# Patient Record
Sex: Male | Born: 1935 | Race: White | Hispanic: No | Marital: Married | Smoking: Never smoker
Health system: Southern US, Community
[De-identification: ages and names within clinical notes are randomized; demographics above are authoritative.]

## PROBLEM LIST (undated history)

## (undated) DIAGNOSIS — I10 Essential (primary) hypertension: Secondary | ICD-10-CM

## (undated) DIAGNOSIS — L03119 Cellulitis of unspecified part of limb: Secondary | ICD-10-CM

## (undated) DIAGNOSIS — I8000 Phlebitis and thrombophlebitis of superficial vessels of unspecified lower extremity: Secondary | ICD-10-CM

## (undated) DIAGNOSIS — M1710 Unilateral primary osteoarthritis, unspecified knee: Secondary | ICD-10-CM

## (undated) DIAGNOSIS — E119 Type 2 diabetes mellitus without complications: Secondary | ICD-10-CM

## (undated) DIAGNOSIS — E039 Hypothyroidism, unspecified: Secondary | ICD-10-CM

## (undated) DIAGNOSIS — L02419 Cutaneous abscess of limb, unspecified: Secondary | ICD-10-CM

## (undated) DIAGNOSIS — E785 Hyperlipidemia, unspecified: Secondary | ICD-10-CM

## (undated) DIAGNOSIS — L259 Unspecified contact dermatitis, unspecified cause: Secondary | ICD-10-CM

## (undated) HISTORY — DX: Hyperlipidemia, unspecified: E78.5

## (undated) HISTORY — DX: Unilateral primary osteoarthritis, unspecified knee: M17.10

## (undated) HISTORY — DX: Essential (primary) hypertension: I10

## (undated) HISTORY — DX: Unspecified contact dermatitis, unspecified cause: L25.9

## (undated) HISTORY — PX: KNEE SURGERY: SHX244

## (undated) HISTORY — DX: Cellulitis of unspecified part of limb: L03.119

## (undated) HISTORY — DX: Cutaneous abscess of limb, unspecified: L02.419

## (undated) HISTORY — PX: CHOLECYSTECTOMY: SHX55

## (undated) HISTORY — DX: Type 2 diabetes mellitus without complications: E11.9

## (undated) HISTORY — PX: OTHER SURGICAL HISTORY: SHX169

## (undated) HISTORY — DX: Hypothyroidism, unspecified: E03.9

## (undated) HISTORY — DX: Phlebitis and thrombophlebitis of superficial vessels of unspecified lower extremity: I80.00

---
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
## 2001-04-07 ENCOUNTER — Ambulatory Visit (HOSPITAL_COMMUNITY): Admission: RE | Admit: 2001-04-07 | Discharge: 2001-04-07 | Payer: Self-pay | Admitting: Family Medicine

## 2003-02-24 HISTORY — PX: CATARACT EXTRACTION: SUR2

## 2004-03-31 ENCOUNTER — Ambulatory Visit: Payer: Self-pay | Admitting: Gastroenterology

## 2004-04-15 ENCOUNTER — Ambulatory Visit: Payer: Self-pay | Admitting: Gastroenterology

## 2004-04-15 HISTORY — PX: COLONOSCOPY: SHX174

## 2005-04-14 ENCOUNTER — Inpatient Hospital Stay (HOSPITAL_COMMUNITY): Admission: RE | Admit: 2005-04-14 | Discharge: 2005-04-17 | Payer: Self-pay | Admitting: Orthopedic Surgery

## 2005-05-11 ENCOUNTER — Encounter: Admission: RE | Admit: 2005-05-11 | Discharge: 2005-06-09 | Payer: Self-pay | Admitting: Orthopedic Surgery

## 2005-06-10 ENCOUNTER — Encounter: Admission: RE | Admit: 2005-06-10 | Discharge: 2005-06-17 | Payer: Self-pay | Admitting: Orthopedic Surgery

## 2007-11-07 ENCOUNTER — Encounter: Payer: Self-pay | Admitting: Family Medicine

## 2008-05-08 ENCOUNTER — Ambulatory Visit: Payer: Self-pay | Admitting: Family Medicine

## 2008-05-08 DIAGNOSIS — L259 Unspecified contact dermatitis, unspecified cause: Secondary | ICD-10-CM

## 2008-05-08 DIAGNOSIS — E119 Type 2 diabetes mellitus without complications: Secondary | ICD-10-CM

## 2008-05-08 DIAGNOSIS — E039 Hypothyroidism, unspecified: Secondary | ICD-10-CM

## 2008-05-08 DIAGNOSIS — E785 Hyperlipidemia, unspecified: Secondary | ICD-10-CM

## 2008-05-08 DIAGNOSIS — I10 Essential (primary) hypertension: Secondary | ICD-10-CM

## 2008-05-08 HISTORY — DX: Unspecified contact dermatitis, unspecified cause: L25.9

## 2008-05-08 HISTORY — DX: Type 2 diabetes mellitus without complications: E11.9

## 2008-05-08 HISTORY — DX: Hypothyroidism, unspecified: E03.9

## 2008-05-08 HISTORY — DX: Hyperlipidemia, unspecified: E78.5

## 2008-05-08 HISTORY — DX: Essential (primary) hypertension: I10

## 2008-05-09 ENCOUNTER — Telehealth: Payer: Self-pay | Admitting: Family Medicine

## 2008-07-09 ENCOUNTER — Ambulatory Visit: Payer: Self-pay | Admitting: Family Medicine

## 2008-08-07 ENCOUNTER — Ambulatory Visit: Payer: Self-pay | Admitting: Family Medicine

## 2008-08-09 ENCOUNTER — Ambulatory Visit: Payer: Self-pay | Admitting: Family Medicine

## 2008-08-09 LAB — CONVERTED CEMR LAB
ALT: 23 units/L (ref 0–53)
Albumin: 4.1 g/dL (ref 3.5–5.2)
Alkaline Phosphatase: 40 units/L (ref 39–117)
BUN: 15 mg/dL (ref 6–23)
Bilirubin, Direct: 0.3 mg/dL (ref 0.0–0.3)
Calcium: 10.1 mg/dL (ref 8.4–10.5)
Cholesterol: 114 mg/dL (ref 0–200)
Creatinine, Ser: 1.1 mg/dL (ref 0.4–1.5)
GFR calc non Af Amer: 70.11 mL/min (ref >60)
TSH: 2.84 microintl units/mL (ref 0.35–5.50)
Total Protein: 7.5 g/dL (ref 6.0–8.3)
Triglycerides: 118 mg/dL (ref 0.0–149.0)
VLDL: 23.6 mg/dL (ref 0.0–40.0)

## 2008-08-10 LAB — CONVERTED CEMR LAB: Hgb A1c MFr Bld: 6.9 % — ABNORMAL HIGH (ref 4.6–6.5)

## 2008-11-27 ENCOUNTER — Telehealth: Payer: Self-pay | Admitting: Family Medicine

## 2008-12-27 ENCOUNTER — Ambulatory Visit: Payer: Self-pay | Admitting: Family Medicine

## 2008-12-27 DIAGNOSIS — I8000 Phlebitis and thrombophlebitis of superficial vessels of unspecified lower extremity: Secondary | ICD-10-CM

## 2008-12-27 HISTORY — DX: Phlebitis and thrombophlebitis of superficial vessels of unspecified lower extremity: I80.00

## 2009-02-06 ENCOUNTER — Ambulatory Visit: Payer: Self-pay | Admitting: Family Medicine

## 2009-02-06 LAB — CONVERTED CEMR LAB: Cholesterol, target level: 200 mg/dL

## 2009-03-25 ENCOUNTER — Ambulatory Visit: Payer: Self-pay | Admitting: Orthopedic Surgery

## 2009-03-25 DIAGNOSIS — IMO0002 Reserved for concepts with insufficient information to code with codable children: Secondary | ICD-10-CM

## 2009-03-25 DIAGNOSIS — M1710 Unilateral primary osteoarthritis, unspecified knee: Secondary | ICD-10-CM | POA: Insufficient documentation

## 2009-03-25 HISTORY — DX: Reserved for concepts with insufficient information to code with codable children: IMO0002

## 2009-03-27 ENCOUNTER — Encounter (INDEPENDENT_AMBULATORY_CARE_PROVIDER_SITE_OTHER): Payer: Self-pay | Admitting: *Deleted

## 2009-03-28 ENCOUNTER — Encounter (INDEPENDENT_AMBULATORY_CARE_PROVIDER_SITE_OTHER): Payer: Self-pay | Admitting: *Deleted

## 2009-04-16 ENCOUNTER — Inpatient Hospital Stay (HOSPITAL_COMMUNITY): Admission: RE | Admit: 2009-04-16 | Discharge: 2009-04-19 | Payer: Self-pay | Admitting: Orthopedic Surgery

## 2009-04-16 ENCOUNTER — Ambulatory Visit: Payer: Self-pay | Admitting: Orthopedic Surgery

## 2009-04-17 ENCOUNTER — Encounter: Payer: Self-pay | Admitting: Orthopedic Surgery

## 2009-04-19 ENCOUNTER — Telehealth: Payer: Self-pay | Admitting: Orthopedic Surgery

## 2009-04-19 ENCOUNTER — Encounter: Payer: Self-pay | Admitting: Orthopedic Surgery

## 2009-04-22 ENCOUNTER — Ambulatory Visit: Payer: Self-pay | Admitting: Orthopedic Surgery

## 2009-04-22 ENCOUNTER — Telehealth: Payer: Self-pay | Admitting: Orthopedic Surgery

## 2009-04-22 DIAGNOSIS — L02419 Cutaneous abscess of limb, unspecified: Secondary | ICD-10-CM

## 2009-04-22 DIAGNOSIS — L03119 Cellulitis of unspecified part of limb: Secondary | ICD-10-CM

## 2009-04-22 HISTORY — DX: Cutaneous abscess of limb, unspecified: L02.419

## 2009-04-24 ENCOUNTER — Telehealth (INDEPENDENT_AMBULATORY_CARE_PROVIDER_SITE_OTHER): Payer: Self-pay | Admitting: *Deleted

## 2009-04-24 ENCOUNTER — Encounter: Payer: Self-pay | Admitting: Orthopedic Surgery

## 2009-04-30 ENCOUNTER — Ambulatory Visit: Payer: Self-pay | Admitting: Orthopedic Surgery

## 2009-05-06 ENCOUNTER — Telehealth: Payer: Self-pay | Admitting: Orthopedic Surgery

## 2009-05-10 ENCOUNTER — Encounter: Payer: Self-pay | Admitting: Orthopedic Surgery

## 2009-05-10 ENCOUNTER — Telehealth: Payer: Self-pay | Admitting: Orthopedic Surgery

## 2009-05-14 ENCOUNTER — Encounter: Admission: RE | Admit: 2009-05-14 | Discharge: 2009-08-12 | Payer: Self-pay | Admitting: Orthopedic Surgery

## 2009-05-14 ENCOUNTER — Encounter: Payer: Self-pay | Admitting: Orthopedic Surgery

## 2009-05-28 ENCOUNTER — Ambulatory Visit: Payer: Self-pay | Admitting: Orthopedic Surgery

## 2009-07-01 ENCOUNTER — Encounter: Payer: Self-pay | Admitting: Orthopedic Surgery

## 2009-07-10 ENCOUNTER — Ambulatory Visit: Payer: Self-pay | Admitting: Orthopedic Surgery

## 2009-07-11 ENCOUNTER — Ambulatory Visit: Payer: Self-pay | Admitting: Family Medicine

## 2009-07-12 LAB — CONVERTED CEMR LAB
AST: 25 units/L (ref 0–37)
Albumin: 4.3 g/dL (ref 3.5–5.2)
Chloride: 97 meq/L (ref 96–112)
Cholesterol: 123 mg/dL (ref 0–200)
Glucose, Bld: 133 mg/dL — ABNORMAL HIGH (ref 70–99)
HDL: 32.1 mg/dL — ABNORMAL LOW (ref >39.00)
Hgb A1c MFr Bld: 7 % — ABNORMAL HIGH (ref 4.6–6.5)
LDL Cholesterol: 68 mg/dL (ref 0–99)
Potassium: 4.4 meq/L (ref 3.5–5.1)
Sodium: 135 meq/L (ref 135–145)
Total CHOL/HDL Ratio: 4
Triglycerides: 114 mg/dL (ref 0.0–149.0)
VLDL: 22.8 mg/dL (ref 0.0–40.0)

## 2009-08-12 ENCOUNTER — Encounter: Payer: Self-pay | Admitting: Family Medicine

## 2009-10-10 ENCOUNTER — Ambulatory Visit: Payer: Self-pay | Admitting: Orthopedic Surgery

## 2009-11-02 LAB — HM DIABETES EYE EXAM

## 2009-11-14 ENCOUNTER — Encounter: Payer: Self-pay | Admitting: Family Medicine

## 2010-01-06 ENCOUNTER — Ambulatory Visit: Payer: Self-pay | Admitting: Family Medicine

## 2010-01-07 LAB — CONVERTED CEMR LAB: Hgb A1c MFr Bld: 7.1 % — ABNORMAL HIGH (ref 4.6–6.5)

## 2010-03-25 NOTE — Miscellaneous (Signed)
Summary: [facility]ed info to [facility] for tka  Clinical Lists Changes

## 2010-03-25 NOTE — Miscellaneous (Signed)
Summary: PT progress note  PT progress note   Imported By: Jacklynn Ganong 07/19/2009 08:29:21  _____________________________________________________________________  External Attachment:    Type:   Image     Comment:   External Document

## 2010-03-25 NOTE — Assessment & Plan Note (Signed)
Summary: POST OP 1/RT TKA/SURG 04/16/09/HUMANA/CAF   Visit Type:  Follow-up Primary Provider:  Evelena Peat MD  CC:  post op tka.  History of Present Illness: post op RT TKA 04/16/2009  POD # 14   cellulitis in the distal part of the tibia is being treated with Keflex. He has 5 more days of that. That has improved.  His incision looks good in that area has improved, but still surrounding erythema distally. Swelling is down.  I removed the staples today.  Continue physical therapy. Followup in 4 weeks for 1st x-ray since surgery  Staples out in our office today.  Pain med helps, Hydrocodone and Robaxin.     Allergies: No Known Drug Allergies   Impression & Recommendations:  Problem # 1:  anAFTERCARE FOLLOW SURGERY MUSCULOSKEL SYSTEM NEC (ICD-V58.78)  Orders: Post-Op Check (21308)  Problem # 2:  KNEE, ARTHRITIS, DEGEN./OSTEO (ICD-715.96)  His updated medication list for this problem includes:    Eql Aspirin Ec 325 Mg Tbec (Aspirin) ..... Once daily  Orders: Post-Op Check (65784)  Patient Instructions: 1)  STOP CPM  2)  RETURN IN 4 WEEKS FOR 1ST POST OP XRAYS  3)  FINISH KEFLEX.

## 2010-03-25 NOTE — Progress Notes (Signed)
Summary: PT and INR  and has red area on leg  Phone Note Other Incoming   Summary of Call: [name] called about [name] (07-28-2035) his INR is 1.2 and PT 12.3 taking 3mg  Coumadin only has 4 left. Also says a small area on the front of his leg is red and warm to the touch,  thinks may be cellulitis.  Told him to come into the office this afternoon for Dr. Romeo Apple to check. His # [phone] Initial call taken by: Jacklynn Ganong,  April 22, 2009 9:51 AM

## 2010-03-25 NOTE — Assessment & Plan Note (Signed)
Summary: POST OP 2/4 WK RE-CK/XRAY TKA RT/SURG 04/16/09/HUMANA/CAF   Visit Type:  Follow-up Primary Provider:  Evelena Peat MD  CC:  postop.  History of Present Illness: PROCEDURE:  Right total knee arthroplasty with a DePuy Sigma rotating platform posterior stabilized knee.  Sizes of implants, size 5 femur, size 5 tibia, size 15 posterior stabilized polyethylene insert and a 38 patella.  date of surgery, April 16, 2009  Treated with Keflex for a lower leg cellulitis  Current medications Oliveri once in a while.  Examination shows he still has a lot of leg edema and swelling. No calf tenderness and a negative Homans sign. His range of motion is 0-90.  We took 3 views of the implant and it shows that the prosthesis is in good position. There is slight varus tilt to the tibial tray. Everything else looks great.  Impression normal postop appearance. Total knee film.  Continue physical therapy to try to gain 110 of flexion. Followup 6 weeks. No x-rays needed  Allergies: No Known Drug Allergies   Impression & Recommendations:  Problem # 1:  CELLULITIS, RIGHT LEG (ICD-682.6) Assessment Improved  Problem # 2:  AFTERCARE FOLLOW SURGERY MUSCULOSKEL SYSTEM NEC (ICD-V58.78)  Orders: Post-Op Check (60454) Knee x-ray,  3 views (09811)  Patient Instructions: 1)  continue rehab and see in 6 weeks

## 2010-03-25 NOTE — Progress Notes (Signed)
Summary: still redness of leg, finished ATBS today  Phone Note Other Incoming Call back at [facility] with [facility]   Summary of Call: staples out last week, had phlebitis of the leg, finished ATBS today still has some redness, warmth no pain. Any suggestions? Initial call taken by: Ether Griffins,  May 06, 2009 10:03 AM  Follow-up for Phone Call        resume antibiotics  Keflex 500mg  bid Follow-up by: Fuller Canada MD,  May 06, 2009 10:04 AM    New/Updated Medications: KEFLEX 500 MG CAPS (CEPHALEXIN) one by mouth bid Prescriptions: KEFLEX 500 MG CAPS (CEPHALEXIN) one by mouth bid  #28 x 1   Entered by:   Ether Griffins   Authorized by:   Fuller Canada MD   Signed by:   Ether Griffins on 05/06/2009   Method used:   Faxed to ...       [facility]  [address] [phone]* (retail)       [address]       [place], [place]  [address]       Ph: [phone] or [phone]       Fax: [phone]   RxID:   [id number]

## 2010-03-25 NOTE — Progress Notes (Signed)
Summary: call from home health Coumadin question  Phone Note Other Incoming   Caller: Home health nurse Summary of Call: [facility] nurse Jacqulyn Cane called and has question re: Coumadin.  Please call cell [phone]. until Friday and then stop coumadin for knees   Initial call taken by: Cammie Sickle,  April 24, 2009 11:55 AM  Follow-up for Phone Call        called and left message that pt will stop Coumadin on Friday, No need for a PT INR check, call me if any other questions. Follow-up by: Ether Griffins,  April 24, 2009 12:35 PM

## 2010-03-25 NOTE — Letter (Signed)
Summary: surgery order RT total knee sched 04/16/09  surgery order RT total knee sched 04/16/09   Imported By: Cammie Sickle 06/05/2009 09:24:03  _____________________________________________________________________  External Attachment:    Type:   Image     Comment:   External Document

## 2010-03-25 NOTE — Miscellaneous (Signed)
Summary: PT order  PT order   Imported By: Cammie Sickle 06/05/2009 09:21:01  _____________________________________________________________________  External Attachment:    Type:   Image     Comment:   External Document

## 2010-03-25 NOTE — Miscellaneous (Signed)
Summary: [facility] verbal orders  [employer] orders   Imported By: Jacklynn Ganong 05/07/2009 13:24:55  _____________________________________________________________________  External Attachment:    Type:   Image     Comment:   External Document

## 2010-03-25 NOTE — Miscellaneous (Signed)
Summary: Home Care Report  Home Care Report   Imported By: Elvera Maria 04/19/2009 10:06:18  _____________________________________________________________________  External Attachment:    Type:   Image     Comment:   [facility] eval

## 2010-03-25 NOTE — Medication Information (Signed)
Summary: Refill Request for Simvastatin  Refill Request for Simvastatin   Imported By: Maryln Gottron 08/13/2009 13:40:38  _____________________________________________________________________  External Attachment:    Type:   Image     Comment:   External Document

## 2010-03-25 NOTE — Miscellaneous (Signed)
Summary: PT Initial summary  PT Initial summary   Imported By: Jacklynn Ganong 05/20/2009 08:11:36  _____________________________________________________________________  External Attachment:    Type:   Image     Comment:   External Document

## 2010-03-25 NOTE — Letter (Signed)
Summary: Diabetic Eye Exam/[facility]  Diabetic Eye [facility]   Imported By: Maryln Gottron 11/21/2009 10:29:19  _____________________________________________________________________  External Attachment:    Type:   Image     Comment:   External Document

## 2010-03-25 NOTE — Letter (Signed)
Summary: Letter of medical necessity  Letter of medical necessity   Imported By: Jacklynn Ganong 05/02/2009 16:31:08  _____________________________________________________________________  External Attachment:    Type:   Image     Comment:   External Document

## 2010-03-25 NOTE — Assessment & Plan Note (Signed)
Summary: 6 WK RE-CK FOL'G PT/RT TKA SURG 04/16/09/[facility]MANA/CAF   Visit Type:  post op Primary Provider:  Evelena Peat MD  CC:  right knee.  History of Present Illness: 75 year old male status post knee replacement surgery, 12 week postop  PROCEDURE:  Right total knee arthroplasty with a DePuy Sigma rotating platform posterior stabilized knee.    date of surgery, April 16, 2009  Current medications Nancie Neas once in a while.  He has no pain most of the time his therapy notes indicate 120 of flexion full extension he met all goals  His knee looks great he will followup with me in 3 months no x-ray needed  Allergies: No Known Drug Allergies   Impression & Recommendations:  Problem # 1:  AFTERCARE FOLLOW SURGERY MUSCULOSKEL SYSTEM NEC (ICD-V58.78) Assessment Comment Only  doing very well no x-rays needed Visit 3 month followup  Orders: Post-Op Check (16109)  Patient Instructions: 1)  Please schedule a follow-up appointment in 3 months.

## 2010-03-25 NOTE — Letter (Signed)
Summary: *Consult Note  [facility]  [address] [place] [address]  [place], [place] [address]   Phone: [phone]  Fax: [phone]    Re:    [name] DOB:    Sep 21, 1935   Dear: Smitty Cords   Thank you for requesting that we see the above patient for consultation.  A copy of the detailed office note will be sent under separate cover, for your review.  Evaluation today is consistent with:  1)  KNEE, ARTHRITIS, DEGEN./OSTEO (ICD-715.96)   Our recommendation is for: RIGHT total knee arthroplasty   New Orders include:  1)  New Patient Level IV [99204] 2)  Knee x-ray,  3 views [73562]   New Medications started today include:    After today's visit, the patients current medications include: 1)  LEVOTHROID 88 MCG TABS (LEVOTHYROXINE SODIUM) once daily 2)  METFORMIN HCL 500 MG TABS (METFORMIN HCL) 2 pills in am, 1 pill in evening 3)  PROPRANOLOL-HCTZ 40-25 MG TABS (PROPRANOLOL-HCTZ) once daily 4)  SIMVASTATIN 40 MG TABS (SIMVASTATIN) once daily 5)  EQL ASPIRIN EC 325 MG TBEC (ASPIRIN) once daily 6)  OSTEO BI-FLEX JOINT SHIELD  TABS (MISC NATURAL PRODUCTS) 2 pills daily 7)  CHELATED POTASSIUM 99 MG TABS (POTASSIUM) once daily 8)  TRIAMCINOLONE ACETONIDE 0.1 % CREA (TRIAMCINOLONE ACETONIDE) apply to affected rash two times a day prn 9)  AMLODIPINE BESYLATE 10 MG TABS (AMLODIPINE BESYLATE) one by mouth once daily 10)  BENAZEPRIL HCL 20 MG TABS (BENAZEPRIL HCL) one by mouth once daily   Thank you for this consultation.  If you have any further questions regarding the care of this patient, please do not hesitate to contact me @ [phone]  Thank you for this opportunity to look after your patient.  Sincerely,   Fuller Canada MD

## 2010-03-25 NOTE — Assessment & Plan Note (Signed)
Summary: cpx---will fast//ccm   Vital Signs:  Patient profile:   75 year old male Height:      71 inches Weight:      208 pounds BMI:     29.11 Temp:     98.7 degrees F oral Pulse rate:   72 / minute Pulse rhythm:   regular Resp:     12 per minute BP sitting:   120 / 84  (left arm) Cuff size:   large  Vitals Entered By: Sid Falcon LPN (Jul 11, 2009 9:06 AM)  Nutrition Counseling: Patient's BMI is greater than 25 and therefore counseled on weight management options. CC: CPX, pt fasting, Hypertension Management, Lipid Management   History of Present Illness: Here for f/u multiple medical problems.  R TKR 2/11 and recovered well.  Diabetes around 130 fasting.  No hypoglycemia.  hypothyroid stable on medication.  Last tetanus unknown. Checking on date of last colonoscopy.  Diabetes Management History:      He has not been enrolled in the "Diabetic Education Program".  He states understanding of dietary principles and is following his diet appropriately.  No sensory loss is reported.  Self foot exams are being performed.  He is checking home blood sugars.        Hypoglycemic symptoms are not occurring.  No hyperglycemic symptoms are reported.        There are no symptoms to suggest diabetic complications.    Hypertension History:      He denies headache, chest pain, palpitations, dyspnea with exertion, orthopnea, PND, neurologic problems, syncope, and side effects from treatment.        Positive major cardiovascular risk factors include male age 56 years old or older, diabetes, hyperlipidemia, hypertension, and family history for ischemic heart disease (males less than 87 years old).  Negative major cardiovascular risk factors include non-tobacco-user status.        Further assessment for target organ damage reveals no history of ASHD, stroke/TIA, or peripheral vascular disease.    Lipid Management History:      Positive NCEP/ATP III risk factors include male age 43 years old  or older, diabetes, HDL cholesterol less than 40, family history for ischemic heart disease (males less than 1 years old), and hypertension.  Negative NCEP/ATP III risk factors include non-tobacco-user status, no ASHD (atherosclerotic heart disease), no prior stroke/TIA, no peripheral vascular disease, and no history of aortic aneurysm.      Allergies: No Known Drug Allergies  Past History:  Past Medical History: Last updated: 03/25/2009 Diabetes mellitus, type II Hypertension Arthritis Chicken Pox Hay Fever/Allergies Hepatitis/Jaundice cholesterol high thyroid  Past Surgical History: Last updated: 03/25/2009 Cholecystectomy1997 Tonsillectomy  1946 left knee replacement/Feb 2007 Dr. Ranell Patrick  nasal polyps  Family History: Last updated: 03/25/2009 Family History of Arthritis parent Family History Hypertension Parent Family History of Cardiovascular disorder  parent Stroke  Family History Coronary Heart Disease male < 37 Family History of Arthritis  Social History: Last updated: 03/25/2009 Retired Married Never Smoked Alcohol use-no 4 cups of caffeine daily  Dr. Caryl Never PMD in [place]/  Risk Factors: Smoking Status: never (05/08/2008) PMH-FH-SH reviewed for relevance  Review of Systems  The patient denies anorexia, weight loss, weight gain, vision loss, decreased hearing, chest pain, syncope, dyspnea on exertion, prolonged cough, headaches, abdominal pain, and incontinence.    Physical Exam  General:  Well-developed,well-nourished,in no acute distress; alert,appropriate and cooperative throughout examination Eyes:  pupils equal, pupils round, and pupils reactive to light.   Ears:  External ear exam shows no significant lesions or deformities.  Otoscopic examination reveals clear canals, tympanic membranes are intact bilaterally without bulging, retraction, inflammation or discharge. Hearing is grossly normal bilaterally. Mouth:  Oral mucosa and oropharynx without  lesions or exudates.  Teeth in good repair. Neck:  No deformities, masses, or tenderness noted. Lungs:  Normal respiratory effort, chest expands symmetrically. Lungs are clear to auscultation, no crackles or wheezes. Heart:  normal rate, regular rhythm, and no gallop.   Extremities:  trace edema bil.  No foot lesions. Neurologic:  alert & oriented X3, cranial nerves II-XII intact, and strength normal in all extremities.    Diabetes Management Exam:    Foot Exam (with socks and/or shoes not present):       Sensory-Pinprick/Light touch:          Left medial foot (L-4): normal          Left dorsal foot (L-5): normal          Left lateral foot (S-1): normal          Right medial foot (L-4): normal          Right dorsal foot (L-5): normal          Right lateral foot (S-1): normal       Sensory-Monofilament:          Left foot: normal          Right foot: normal       Inspection:          Left foot: normal          Right foot: normal       Nails:          Left foot: thickened          Right foot: thickened    Eye Exam:       Eye Exam done elsewhere          Date: 10/24/2008          Results: normal   Impression & Recommendations:  Problem # 1:  HYPERTENSION (ICD-401.9)  His updated medication list for this problem includes:    Propranolol-hctz 40-25 Mg Tabs (Propranolol-hctz) ..... Once daily    Amlodipine Besylate 10 Mg Tabs (Amlodipine besylate) ..... One by mouth once daily    Benazepril Hcl 20 Mg Tabs (Benazepril hcl) ..... One by mouth once daily  Orders: Prescription Created Electronically [phone]) Venipuncture [phone]) TLB-BMP (Basic Metabolic Panel-BMET) (80048-METABOL)  Problem # 2:  DIABETES MELLITUS, TYPE II (ICD-250.00)  His updated medication list for this problem includes:    Metformin Hcl 500 Mg Tabs (Metformin hcl) .Marland Kitchen... 2 pills in am, 1 pill in evening    Eql Aspirin Ec 325 Mg Tbec (Aspirin) ..... Once daily    Benazepril Hcl 20 Mg Tabs (Benazepril hcl)  ..... One by mouth once daily  Orders: Venipuncture (14782) TLB-A1C / Hgb A1C (Glycohemoglobin) (83036-A1C)  Problem # 3:  HYPERLIPIDEMIA (ICD-272.4)  His updated medication list for this problem includes:    Simvastatin 40 Mg Tabs (Simvastatin) ..... Once daily  Orders: TLB-Lipid Panel (80061-LIPID) TLB-Hepatic/Liver Function Pnl (80076-HEPATIC)  Problem # 4:  HYPOTHYROIDISM (ICD-244.9)  His updated medication list for this problem includes:    Levothroid 88 Mcg Tabs (Levothyroxine sodium) ..... Once daily  Orders: Venipuncture (95621) TLB-TSH (Thyroid Stimulating Hormone) (84443-TSH)  Complete Medication List: 1)  Levothroid 88 Mcg Tabs (Levothyroxine sodium) .... Once daily 2)  Metformin Hcl 500 Mg Tabs (Metformin hcl) .Marland KitchenMarland KitchenMarland Kitchen  2 pills in am, 1 pill in evening 3)  Propranolol-hctz 40-25 Mg Tabs (Propranolol-hctz) .... Once daily 4)  Simvastatin 40 Mg Tabs (Simvastatin) .... Once daily 5)  Eql Aspirin Ec 325 Mg Tbec (Aspirin) .... Once daily 6)  Chelated Potassium 99 Mg Tabs (Potassium) .... Once daily 7)  Triamcinolone Acetonide 0.1 % Crea (Triamcinolone acetonide) .... Apply to affected rash two times a day as needed 8)  Amlodipine Besylate 10 Mg Tabs (Amlodipine besylate) .... One by mouth once daily 9)  Benazepril Hcl 20 Mg Tabs (Benazepril hcl) .... One by mouth once daily  Other Orders: TD Toxoids IM 7 YR + (29562) Admin 1st Vaccine (13086)  Diabetes Management Assessment/Plan:      The following lipid goals have been established for the patient: Total cholesterol goal of 200; LDL cholesterol goal of 100; HDL cholesterol goal of 40; Triglyceride goal of 150.    Hypertension Assessment/Plan:      The patient's hypertensive risk group is category C: Target organ damage and/or diabetes.  His calculated 10 year risk of coronary heart disease is 22 %.  Today's blood pressure is 120/84.    Lipid Assessment/Plan:      Based on NCEP/ATP III, the patient's risk factor  category is "history of diabetes".  The patient's lipid goals are as follows: Total cholesterol goal is 200; LDL cholesterol goal is 100; HDL cholesterol goal is 40; Triglyceride goal is 150.    Patient Instructions: 1)  Please schedule a follow-up appointment in 6 months .  2)  Check your blood sugars regularly. If your readings are usually above:  or below 70 you should contact our office.  3)  It is important that your diabetic A1c level is checked every 3 months.  4)  See your eye doctor yearly to check for diabetic eye damage. 5)  Check your feet each night  for sore areas, calluses or signs of infection.  Prescriptions: BENAZEPRIL HCL 20 MG TABS (BENAZEPRIL HCL) one by mouth once daily  #90 x 3   Entered and Authorized by:   Evelena Peat MD   Signed by:   Evelena Peat MD on 07/11/2009   Method used:   Faxed to ...       [facility] (mail-order)       [address]       [place], [place]  [id number]       Ph: [phone]       Fax: [phone]   RxID:   [id number] AMLODIPINE BESYLATE 10 MG TABS (AMLODIPINE BESYLATE) one by mouth once daily  #90 x 3   Entered and Authorized by:   Evelena Peat MD   Signed by:   Evelena Peat MD on 07/11/2009   Method used:   Faxed to ...       [facility] (mail-order)       [address]       [place], [place]  [id number]       Ph: [phone]       Fax: [phone]   RxID:   [id number]     Immunizations Administered:  Tetanus Vaccine:    Vaccine Type: Td    Site: left deltoid    Mfr: Sanofi Pasteur    Dose: 0.5 ml    Route: IM    Given by: Sid Falcon LPN    Exp. Date: 03/08/2011    Lot #: [id number]

## 2010-03-25 NOTE — Progress Notes (Signed)
Summary: patient discharged from home PT, order needed  Phone Note Other Incoming   Caller: Physical therapist Summary of Call: Ms. Crest, physical therapist from [facility] is discharging patient from home therapy. Needs a PT order for out-patient therapy faxed to [facility] location for therapy.  If questions her direct ph # is [phone].    * Patient also called to let us know that he has an appt scheduled for PT for Tues, 3/22 at 2:00pm Initial call taken by: Cammie Sickle,  May 10, 2009 11:18 AM

## 2010-03-25 NOTE — Assessment & Plan Note (Signed)
Summary: 5 M RE-CK RT KNEE/TKA 04/16/09/XRAY/HUMANA/CAF   Visit Type:  Follow-up Primary Provider:  Evelena Peat MD  CC:  right knee replacement.  History of Present Illness: This is a 6 month followup status post RIGHT total knee arthroplasty.  Patient had previous LEFT total knee arthroplasty in [place] but had to come here because of an insurance issue.  He is happy with his knee his pain has been relieved.  No complaints  6 months   PROCEDURE:  Right total knee arthroplasty with a DePuy Sigma rotating platform posterior stabilized knee.    Current medications Oliveri once in a while.   Allergies: No Known Drug Allergies  Physical Exam  Additional Exam:  the patient ambulates with a normal gait for knee patient the stride length is diminished and the speed of walking is diminished but at the heel to toe gait.  His incision looks good there is no neuroma.  There is minimal swelling to the knee.  No joint effusion.  His range of motion is 105 with full extension.  Strength is normal in extension.  His knee is stable in anterior posterior plane and the medial lateral plane.     Impression & Recommendations:  Problem # 1:  AFTERCARE FOLLOW SURGERY MUSCULOSKEL SYSTEM NEC (ICD-V58.78) Assessment Improved  Orders: Est. Patient Level III (16109)  Problem # 2:  KNEE, ARTHRITIS, DEGEN./OSTEO (ICD-715.96) Assessment: Improved  His updated medication list for this problem includes:    Eql Aspirin Ec 325 Mg Tbec (Aspirin) ..... Once daily  Orders: Est. Patient Level III (60454)  Patient Instructions: 1)  Please schedule a follow-up appointment in 6 months. 2)  xrays of the the right TKA   Appended Document: Preload-Flu Vaccine     Immunization History:  Influenza Immunization History:    Influenza:  historical (11/25/2009)    Immunization History:  Influenza Immunization History:    Influenza:  historical (11/25/2009)

## 2010-03-25 NOTE — Miscellaneous (Signed)
Summary: [facility]  homecare plan of care  [facility]  homecare plan of care   Imported By: Jacklynn Ganong 04/29/2009 10:40:03  _____________________________________________________________________  External Attachment:    Type:   Image     Comment:   External Document

## 2010-03-25 NOTE — Miscellaneous (Signed)
Summary: [facility] Episode summary report  [facility] Episode summary report   Imported By: Jacklynn Ganong 05/21/2009 13:57:13  _____________________________________________________________________  External Attachment:    Type:   Image     Comment:   External Document

## 2010-03-25 NOTE — Assessment & Plan Note (Signed)
Summary: follow up/pt coming in fasting/cjr   Vital Signs:  Patient profile:   75 year old male Weight:      209 pounds Temp:     98.0 degrees F oral BP sitting:   130 / 78  (left arm) Cuff size:   large  Vitals Entered By: Sid Falcon LPN (January 06, 2010 8:24 AM)  History of Present Illness: Followup type 2 diabetes. Patient has had stable fasting blood sugars mostly around 130. No symptoms of hyper or hypoglycemia. Recent eye exam normal. Total knee replacements since visit and ambulating without difficulty.  Diabetes Management History:      He has not been enrolled in the "Diabetic Education Program".  He states understanding of dietary principles and is following his diet appropriately.  No sensory loss is reported.  Self foot exams are being performed.  He is checking home blood sugars.  He says that he is exercising.        Hypoglycemic symptoms are not occurring.  No hyperglycemic symptoms are reported.        No changes have been made to his treatment plan since last visit.    Preventive Screening-Counseling & Management  Caffeine-Diet-Exercise     Does Patient Exercise: yes  Allergies (verified): No Known Drug Allergies  Social History: Does Patient Exercise:  yes  Review of Systems      See HPI  Physical Exam  General:  Well-developed,well-nourished,in no acute distress; alert,appropriate and cooperative throughout examination Ears:  External ear exam shows no significant lesions or deformities.  Otoscopic examination reveals clear canals, tympanic membranes are intact bilaterally without bulging, retraction, inflammation or discharge. Hearing is grossly normal bilaterally. Mouth:  Oral mucosa and oropharynx without lesions or exudates.  Teeth in good repair. Neck:  No deformities, masses, or tenderness noted. Lungs:  Normal respiratory effort, chest expands symmetrically. Lungs are clear to auscultation, no crackles or wheezes. Heart:  normal rate and  regular rhythm.   Extremities:  No clubbing, cyanosis, edema, or deformity noted with normal full range of motion of all joints.   Neurologic:  alert & oriented X3 and cranial nerves II-XII intact.   Skin:  no rashes.   Cervical Nodes:  No lymphadenopathy noted  Diabetes Management Exam:    Foot Exam (with socks and/or shoes not present):       Sensory-Pinprick/Light touch:          Left medial foot (L-4): normal          Left dorsal foot (L-5): normal          Left lateral foot (S-1): normal          Right medial foot (L-4): normal          Right dorsal foot (L-5): normal          Right lateral foot (S-1): normal       Sensory-Monofilament:          Left foot: normal          Right foot: normal       Inspection:          Left foot: normal          Right foot: normal       Nails:          Left foot: normal          Right foot: normal    Eye Exam:       Eye Exam done elsewhere  Date: 11/14/2009          Results: normal          Done by: [facility]   Impression & Recommendations:  Problem # 1:  DIABETES MELLITUS, TYPE II (ICD-250.00)  His updated medication list for this problem includes:    Metformin Hcl 500 Mg Tabs (Metformin hcl) .Marland Kitchen... 2 pills in am, 1 pill in evening    Eql Aspirin Ec 325 Mg Tbec (Aspirin) ..... Once daily    Benazepril Hcl 20 Mg Tabs (Benazepril hcl) ..... One by mouth once daily  Orders: Specimen Handling (09811) Venipuncture (91478) TLB-A1C / Hgb A1C (Glycohemoglobin) (83036-A1C)  Problem # 2:  ESSENTIAL HYPERTENSION (ICD-401.9)  His updated medication list for this problem includes:    Propranolol-hctz 40-25 Mg Tabs (Propranolol-hctz) ..... Once daily    Amlodipine Besylate 10 Mg Tabs (Amlodipine besylate) ..... One by mouth once daily    Benazepril Hcl 20 Mg Tabs (Benazepril hcl) ..... One by mouth once daily  Complete Medication List: 1)  Levothroid 88 Mcg Tabs (Levothyroxine sodium) .... Once daily 2)  Metformin Hcl 500 Mg  Tabs (Metformin hcl) .... 2 pills in am, 1 pill in evening 3)  Propranolol-hctz 40-25 Mg Tabs (Propranolol-hctz) .... Once daily 4)  Simvastatin 40 Mg Tabs (Simvastatin) .... Once daily 5)  Eql Aspirin Ec 325 Mg Tbec (Aspirin) .... Once daily 6)  Chelated Potassium 99 Mg Tabs (Potassium) .... Once daily 7)  Triamcinolone Acetonide 0.1 % Crea (Triamcinolone acetonide) .... Apply to affected rash two times a day as needed 8)  Amlodipine Besylate 10 Mg Tabs (Amlodipine besylate) .... One by mouth once daily 9)  Benazepril Hcl 20 Mg Tabs (Benazepril hcl) .... One by mouth once daily  Diabetes Management Assessment/Plan:      The following lipid goals have been established for the patient: Total cholesterol goal of 200; LDL cholesterol goal of 100; HDL cholesterol goal of 40; Triglyceride goal of 150.  His blood pressure goal is < 130/80.    Patient Instructions: 1)  Please schedule a follow-up appointment in 6 months .  2)  Check your blood sugars regularly. If your readings are usually above:  or below 70 you should contact our office.  3)  It is important that your diabetic A1c level is checked every 3 months.  4)  See your eye doctor yearly to check for diabetic eye damage. 5)  Check your feet each night  for sore areas, calluses or signs of infection.    Orders Added: 1)  Specimen Handling [99000] 2)  Venipuncture [36415] 3)  TLB-A1C / Hgb A1C (Glycohemoglobin) [83036-A1C] 4)  Est. Patient Level III [29562]

## 2010-03-25 NOTE — Letter (Signed)
Summary: Handicapped placard  Handicapped placard   Imported By: Cammie Sickle 06/05/2009 09:25:36  _____________________________________________________________________  External Attachment:    Type:   Image     Comment:   External Document

## 2010-03-25 NOTE — Assessment & Plan Note (Signed)
Summary: AREA ON LEG RED /WARM TO TOUCH/POST OP/H8UMANA/BSF   Visit Type:  post op  Primary Provider:  Evelena Peat MD  CC:  pain redness of the leg .  History of Present Illness: post op RT TKA 04/16/2009  c/o redness in lower leg below incision  tender erythematous right pretibial area. knee looks great, no redness there   h/o varicose veins looks like cellulitis phlebitis   treat with antibiotics [keflex]  return TUES     Allergies: No Known Drug Allergies   Impression & Recommendations:  Problem # 1:  AFTERCARE FOLLOW SURGERY MUSCULOSKEL SYSTEM NEC (ICD-V58.78) Assessment Comment Only  Orders: Post-Op Check (16109)  Problem # 2:  KNEE, ARTHRITIS, DEGEN./OSTEO (ICD-715.96) Assessment: Comment Only  His updated medication list for this problem includes:    Eql Aspirin Ec 325 Mg Tbec (Aspirin) ..... Once daily  Orders: Post-Op Check (60454)  Problem # 3:  CELLULITIS, RIGHT LEG (ICD-682.6) Assessment: New  Orders: Post-Op Check (09811)  Patient Instructions: 1)  f/u Tues

## 2010-03-25 NOTE — Miscellaneous (Signed)
Summary: Authorization for in-patient surgery  Clinical Lists Changes  On 03/26/09, contacted insurer [employer], regarding in-patient surgery scheduled 04/16/09, [facility], total knee arthroplasty CPT [phone].  Spoke w/Jen re: authorization, and she will have a Oncologist contact our office for further clinical information.   Ref # [id number]   Pre-Auth [id number], received per automated message system

## 2010-03-25 NOTE — Letter (Signed)
Summary: History form  History form   Imported By: Jacklynn Ganong 03/28/2009 16:33:52  _____________________________________________________________________  External Attachment:    Type:   Image     Comment:   External Document

## 2010-03-25 NOTE — Progress Notes (Signed)
Summary: Auth home therapy [facility]  Phone Note Outgoing Call   Call placed to: Insurer Summary of Call: As per request by home health provider [facility], contacted [facility] re: referral for home pt, Pt I & R. States this insurance asks for the providers office to initiate the auth.  Rec'd per Harvie Heck: Approved, Auth # [id number], beginning 04/20/09. Patient being d/c from hospital today.  Initial call taken by: Cammie Sickle,  April 19, 2009 11:28 AM

## 2010-03-25 NOTE — Letter (Signed)
Summary: Hosp progress note  Hosp progress note   Imported By: Cammie Sickle 05/10/2009 19:03:55  _____________________________________________________________________  External Attachment:    Type:   Image     Comment:   External Document

## 2010-03-25 NOTE — Assessment & Plan Note (Signed)
Summary: rt knee pain/needs xr/[employer]/bsf   Vital Signs:  Patient profile:   75 year old male Weight:      205 pounds Pulse rate:   76 / minute Resp:     18 per minute  Vitals Entered By: Fuller Canada MD (March 25, 2009 9:14 AM)  Visit Type:  Initial Consult Primary Provider:  Evelena Peat MD  CC:  right knee pain.  History of Present Illness: This is a 75 year old male status post LEFT total knee replacement with the Sigma rotating platform by Dr. Ranell Patrick in 2007 and did well.  He comes in today complaining of moderate RIGHT knee pain present for 4 years no history of injury worse with steps partially alleviated by Aleve.  Activities of daily living are difficult especially going up and down the steps squatting kneeling.  He's had some swelling stiffness catching.  His pain is achy.   Meds: Levothyroxin, Metformin, Propran/HCTZ, Simvastatin, Amlodipine, Benazepril, Aspirin, Osteobiflex, Potassium.  Dr. Ranell Patrick did left knee replacement 4 years ago.  Allergies (verified): No Known Drug Allergies  Past History:  Past Medical History: Diabetes mellitus, type II Hypertension Arthritis Chicken Pox Hay Fever/Allergies Hepatitis/Jaundice cholesterol high thyroid  Past Surgical History: Cholecystectomy1997 Tonsillectomy  1946 left knee replacement/Feb 2007 Dr. Ranell Patrick  nasal polyps  Family History: Family History of Arthritis parent Family History Hypertension Parent Family History of Cardiovascular disorder  parent Stroke  Family History Coronary Heart Disease male < 62 Family History of Arthritis  Social History: Retired Married Never Smoked Alcohol use-no 4 cups of caffeine daily  Dr. Caryl Never PMD in [place]/  Review of Systems General:  Denies weight loss, weight gain, fever, chills, and fatigue. Cardiac :  Denies chest pain, angina, heart attack, heart failure, poor circulation, blood clots, and phlebitis. Resp:  Denies short of breath, difficulty  breathing, COPD, cough, and pneumonia. GI:  Denies nausea, vomiting, diarrhea, constipation, difficulty swallowing, ulcers, GERD, and reflux. GU:  Denies kidney failure, kidney transplant, kidney stones, burning, poor stream, testicular cancer, blood in urine, and . Neuro:  Denies headache, dizziness, migraines, numbness, weakness, tremor, and unsteady walking. MS:  Complains of joint pain, joint swelling, and gout; denies rheumatoid arthritis, bone cancer, osteoporosis, and . Endo:  Complains of diabetes; denies thyroid disease and goiter. Psych:  Denies depression, mood swings, anxiety, panic attack, bipolar, and schizophrenia. Derm:  Denies eczema, cancer, and itching. EENT:  Complains of poor vision, cataracts, poor hearing, ears ringing, and sinusitis; denies glaucoma, vertigo, hoarseness, toothaches, and bleeding gums. Immunology:  Complains of seasonal allergies and sinus problems; denies allergic to bee stings. Lymphatic:  Denies lymph node cancer and lymph edema.  Physical Exam  Additional Exam:  His vital signs are stable as recorded  His appearance is normal his body habitus is medium  He is going to x3  His mood is pleasant  He walks with an altered gait he has a total knee gait pattern on the LEFT and a Veress knee with crust on the RIGHT  His upper extremities are normal on inspection with full range of motion good strength no instability and normal skin  His LEFT knee is nontender no swelling well-healed incision his range of motion is 115 the knee is stable strength is normal the skin is intact  The RIGHT knee is in varus there is severe medial pain and tenderness range of motion is 110 the knee is stable strength is normal skin is intact  He has significant varicosities in both legs they  are severe there is mild peripheral edema and swelling temperature normal pulses normal  Lymph nodes are normal  Sensation is normal  Reflexes are normal  Coordination and  balance are normal     Impression & Recommendations:  Problem # 1:  KNEE, ARTHRITIS, DEGEN./OSTEO (EAV-409.81) Assessment New  xrays: 3 v knee - right  severe varus OA with medial joint space narrowing severe   IMPR varus OA severe    DOS- 16 Apr 2009   POSTOP March 8        His updated medication list for this problem includes:    Eql Aspirin Ec 325 Mg Tbec (Aspirin) ..... Once daily  Orders: New Patient Level IV (19147) Knee x-ray,  3 views (82956)  Patient Instructions: 1)  Informed consent process: I have discussed the procedure with the patient. I have answered their questions. The risks of bleeding, infection, nerve and vascualr injury have been discussed. The diagnosis and reason for surgery have been explained. The patient demonstrates understanding of this discussion. Specific to this procedure risks include:  2)  stiffness 3)  clotting 4)  pain 5)  embolism 6)  DOS 04/16/09 7)  Preop [facility] on 04/12/09 at 10 o clock am, take packet with you 8)  post op visit # 1 March 8  9)    10)

## 2010-05-15 LAB — APTT: aPTT: 31 seconds (ref 24–37)

## 2010-05-15 LAB — DIFFERENTIAL
Basophils Absolute: 0 10*3/uL (ref 0.0–0.1)
Basophils Absolute: 0.1 10*3/uL (ref 0.0–0.1)
Basophils Relative: 0 % (ref 0–1)
Basophils Relative: 0 % (ref 0–1)
Basophils Relative: 1 % (ref 0–1)
Eosinophils Absolute: 0 10*3/uL (ref 0.0–0.7)
Eosinophils Absolute: 0 10*3/uL (ref 0.0–0.7)
Eosinophils Absolute: 0.4 10*3/uL (ref 0.0–0.7)
Eosinophils Relative: 0 % (ref 0–5)
Lymphs Abs: 2.1 10*3/uL (ref 0.7–4.0)
Monocytes Absolute: 0.8 10*3/uL (ref 0.1–1.0)
Monocytes Absolute: 2 10*3/uL — ABNORMAL HIGH (ref 0.1–1.0)
Monocytes Relative: 13 % — ABNORMAL HIGH (ref 3–12)
Monocytes Relative: 9 % (ref 3–12)
Neutrophils Relative %: 63 % (ref 43–77)
Neutrophils Relative %: 70 % (ref 43–77)

## 2010-05-15 LAB — BASIC METABOLIC PANEL
BUN: 10 mg/dL (ref 6–23)
BUN: 11 mg/dL (ref 6–23)
BUN: 14 mg/dL (ref 6–23)
CO2: 27 mEq/L (ref 19–32)
CO2: 28 mEq/L (ref 19–32)
Calcium: 9.5 mg/dL (ref 8.4–10.5)
Chloride: 93 mEq/L — ABNORMAL LOW (ref 96–112)
Chloride: 96 mEq/L (ref 96–112)
Creatinine, Ser: 0.88 mg/dL (ref 0.4–1.5)
GFR calc Af Amer: >60 mL/min (ref >60)
GFR calc non Af Amer: >60 mL/min (ref >60)
Glucose, Bld: 132 mg/dL — ABNORMAL HIGH (ref 70–99)
Glucose, Bld: 154 mg/dL — ABNORMAL HIGH (ref 70–99)
Potassium: 3.6 mEq/L (ref 3.5–5.1)
Potassium: 4.2 mEq/L (ref 3.5–5.1)
Sodium: 127 mEq/L — ABNORMAL LOW (ref 135–145)
Sodium: 134 mEq/L — ABNORMAL LOW (ref 135–145)

## 2010-05-15 LAB — CBC
HCT: 35.3 % — ABNORMAL LOW (ref 39.0–52.0)
HCT: 36 % — ABNORMAL LOW (ref 39.0–52.0)
HCT: 42.2 % (ref 39.0–52.0)
Hemoglobin: 12.1 g/dL — ABNORMAL LOW (ref 13.0–17.0)
Hemoglobin: 13.5 g/dL (ref 13.0–17.0)
MCHC: 33.8 g/dL (ref 30.0–36.0)
MCV: 92 fL (ref 78.0–100.0)
MCV: 92.8 fL (ref 78.0–100.0)
MCV: 93.2 fL (ref 78.0–100.0)
RBC: 4.28 MIL/uL (ref 4.22–5.81)
RDW: 12.3 % (ref 11.5–15.5)
WBC: 14.5 10*3/uL — ABNORMAL HIGH (ref 4.0–10.5)

## 2010-05-15 LAB — CROSSMATCH: ABO/RH(D): A NEG

## 2010-05-15 LAB — PROTIME-INR
INR: 1.22 (ref 0.00–1.49)
Prothrombin Time: 14.6 seconds (ref 11.6–15.2)

## 2010-05-15 LAB — ABO/RH: ABO/RH(D): A NEG

## 2010-07-03 ENCOUNTER — Encounter: Payer: Self-pay | Admitting: Family Medicine

## 2010-07-07 ENCOUNTER — Other Ambulatory Visit: Payer: Self-pay | Admitting: Family Medicine

## 2010-07-07 ENCOUNTER — Encounter: Payer: Self-pay | Admitting: Family Medicine

## 2010-07-07 ENCOUNTER — Ambulatory Visit (INDEPENDENT_AMBULATORY_CARE_PROVIDER_SITE_OTHER): Payer: Medicare HMO | Admitting: Family Medicine

## 2010-07-07 DIAGNOSIS — E785 Hyperlipidemia, unspecified: Secondary | ICD-10-CM

## 2010-07-07 DIAGNOSIS — E119 Type 2 diabetes mellitus without complications: Secondary | ICD-10-CM

## 2010-07-07 DIAGNOSIS — N529 Male erectile dysfunction, unspecified: Secondary | ICD-10-CM | POA: Insufficient documentation

## 2010-07-07 DIAGNOSIS — E039 Hypothyroidism, unspecified: Secondary | ICD-10-CM

## 2010-07-07 DIAGNOSIS — Z299 Encounter for prophylactic measures, unspecified: Secondary | ICD-10-CM

## 2010-07-07 DIAGNOSIS — I10 Essential (primary) hypertension: Secondary | ICD-10-CM

## 2010-07-07 DIAGNOSIS — Z2911 Encounter for prophylactic immunotherapy for respiratory syncytial virus (RSV): Secondary | ICD-10-CM

## 2010-07-07 LAB — HEPATIC FUNCTION PANEL
ALT: 22 U/L (ref 0–53)
AST: 22 U/L (ref 0–37)
Bilirubin, Direct: 0.3 mg/dL (ref 0.0–0.3)
Total Bilirubin: 1.7 mg/dL — ABNORMAL HIGH (ref 0.3–1.2)
Total Protein: 6.8 g/dL (ref 6.0–8.3)

## 2010-07-07 LAB — LIPID PANEL
Cholesterol: 121 mg/dL (ref 0–200)
LDL Cholesterol: 65 mg/dL (ref 0–99)
Triglycerides: 114 mg/dL (ref 0.0–149.0)
VLDL: 22.8 mg/dL (ref 0.0–40.0)

## 2010-07-07 LAB — BASIC METABOLIC PANEL
BUN: 21 mg/dL (ref 6–23)
Chloride: 96 mEq/L (ref 96–112)
Creatinine, Ser: 1 mg/dL (ref 0.4–1.5)
GFR: 74.4 mL/min (ref >60.00)
Potassium: 4.7 mEq/L (ref 3.5–5.1)

## 2010-07-07 MED ORDER — GLUCOSE BLOOD VI STRP: ORAL_STRIP | Status: DC

## 2010-07-07 MED ORDER — SILDENAFIL CITRATE 100 MG PO TABS: 100.0000 mg | ORAL_TABLET | Freq: Every day | ORAL | Status: AC | PRN

## 2010-07-07 MED ORDER — AMLODIPINE BESYLATE 10 MG PO TABS: 10.0000 mg | ORAL_TABLET | Freq: Every day | ORAL | Status: DC

## 2010-07-07 MED ORDER — BENAZEPRIL HCL 20 MG PO TABS: 20.0000 mg | ORAL_TABLET | Freq: Every day | ORAL | Status: DC

## 2010-07-07 NOTE — Progress Notes (Signed)
  Subjective:    Patient ID: [name], male    DOB: August 04, 1935, 75 y.o.   MRN: [id number]  HPI Patient seen for followup of multiple medical problems. He has type 2 diabetes, hypertension, hyperlipidemia, hypothyroidism, erectile dysfunction. Needs refills of several medications. Fasting blood sugars usually around 120. No hypoglycemia. No symptoms of hyperglycemia. Last A1c 7.1%.  Last eye exam last November normal. He sees a podiatrist regularly.  Hyperlipidemia treated with simvastatin 40 mg daily. No myalgias. No history of CAD. Denies recent chest pain.  History erectile dysfunction. Has used Viagra. No nitroglycerin use. Needs refills. Takes intermittently. Hypertension treated with amlodipine, benazepril, and propranolol-hydrochlorothiazide. No orthostasis. Compliant on medications  Past Medical History  Diagnosis Date  . HYPOTHYROIDISM 05/08/2008  . DM w/o complication type II 05/08/2008  . HYPERLIPIDEMIA 05/08/2008  . Unspecified essential hypertension 05/08/2008  . SUPERFICIAL PHLEBITIS 12/27/2008  . CELLULITIS, RIGHT LEG 04/22/2009  . ECZEMA 05/08/2008  . KNEE, ARTHRITIS, DEGEN./OSTEO 03/25/2009   Past Surgical History  Procedure Date  . Cholecystectomy   . Tonsillectomy 1946  . Nasal polyps   . Knee surgery     TKR Dr Devonne Doughty 2007    reports that he has never smoked. He does not have any smokeless tobacco history on file. His alcohol and drug histories not on file. family history includes Arthritis in his other; Heart disease in his other; and Hypertension in his other. No Known Allergies    Review of Systems  Constitutional: Negative for fever, chills, activity change, appetite change and fatigue.  Respiratory: Negative for cough and shortness of breath.   Cardiovascular: Negative for chest pain, palpitations and leg swelling.  Genitourinary: Negative for dysuria.  Hematological: Negative for adenopathy.  Psychiatric/Behavioral: Negative for dysphoric mood.      Objective:   Physical Exam  Constitutional: He is oriented to person, place, and time. He appears well-developed and well-nourished. No distress.  HENT:  Right Ear: External ear normal.  Left Ear: External ear normal.  Mouth/Throat: Oropharynx is clear and moist. No oropharyngeal exudate.  Neck: No thyromegaly present.  Cardiovascular: Normal rate, regular rhythm and normal heart sounds.   Pulmonary/Chest: Effort normal and breath sounds normal. No respiratory distress. He has no wheezes. He has no rales.  Musculoskeletal: He exhibits no edema.       Small callus right foot. Plans to see podiatrist tomorrow for trimming  Lymphadenopathy:    He has no cervical adenopathy.  Neurological: He is alert and oriented to person, place, and time. No cranial nerve deficit.       No sensory deficits in the feet  Psychiatric: He has a normal mood and affect.          Assessment & Plan:  #1 type 2 diabetes. Repeat A1c today. Continue eye exams. No urine microalbumin since takes ACE inhibitor #2 hypertension stable. Refills of medications given #3 hyperlipidemia. Continue current medication. Check lipids and hepatic panel #4 erectile dysfunction. Refill Viagra. #5 hypothyroidism. Recheck TSH #6  Health maintenance.  Discussed Varicella vaccine and this is given today after full discussion of risks and benefits.

## 2010-07-07 NOTE — Telephone Encounter (Signed)
Pt called and wanted to let Dr Caryl Never nurse know that pt uses AccuCheck Aviva Gluco meter and AccuCheck Aviva Plus test strips 90 day supply.

## 2010-07-09 NOTE — Progress Notes (Signed)
Quick Note:  Pt informed ______ 

## 2010-07-11 NOTE — Discharge Summary (Signed)
[name], [name] NO.:  [id number]   MEDICAL RECORD NO.:  [id number]          PATIENT TYPE:  INP   LOCATION:  1511                         FACILITY:  [facility]   PHYSICIAN:  Almedia Balls. Ranell Patrick, M.D. DATE OF BIRTH:  December 21, 1935   DATE OF ADMISSION:  04/14/2005  DATE OF DISCHARGE:  04/17/2005                                 DISCHARGE SUMMARY   ADMISSION DIAGNOSES:  1.  End-stage left knee osteoarthritis.  2.  Hypertension.  3.  Hyperthyroidism.  4.  Insulin-dependent diabetes mellitus.   DISCHARGE DIAGNOSES:  1.  Left knee osteoarthritis, status post left total knee arthroplasty.  2.  Hypertension.  3.  Hyperthyroidism.  4.  Insulin-dependent diabetes mellitus.  5.  Postoperative hyponatremia.   BRIEF HISTORY:  The patient is a 75 year old man who has failed conservative  treatment for his osteoarthritis for his left knee.  He has elected to have  a left total knee arthroplasty to be completed by Dr. Malon Kindle.   PROCEDURE:  The patient had a left total knee arthroplasty using a DePuy  Sigma rotating platform prosthesis with a size 5 tibia, size 5 femur, and a  41 patella with a 12.5 poly insert.   ATTENDING SURGEON:  Almedia Balls. Ranell Patrick, M.D.   ASSISTANT:  Donnie Coffin. Dixon, PA-C.   ANESTHESIA:  General with a femoral block.   TOURNIQUET TIME:  Two hours.   FLUIDS REPLACED:  1700 cc.   URINE OUTPUT:  650 cc.   COMPLICATIONS:  None.   PREOPERATIVE ANTIBIOTICS:  Given.   HOSPITAL COURSE:  Patient was admitted for the above-stated procedure on  April 14, 2005, which he tolerated well.  After an adequate time in the  post anesthesia care unit, he was transferred up to 5 [address].  On postop day  #1, the patient resting comfortably.  Complaining about some mild pain to  that left knee but denies any major problems with pain.  He was taken  through some range of motion with 0-60 degrees without any difficulty.  His  drain was removed.  No sign of calf  tenderness.  Neurovascularly, he was  intact distally.   On postop days 2 and 3, he continued with physical therapy and occupational  therapy and progressed very well.  His incision was healing well with no  signs of cellulitis, erythema, infection, or drainage.  He had no effusion.  He had some mild pedal edema distally.  Capillary refill remained less than  two seconds.  The patient did develop some hyponatremia and hypokalemia  postoperatively, which was treated with both potassium and restricting his  free water.  We appreciate Dr. Michaelyn Barter following along medically to  help manage his hypertension and also to help out with hypokalemia and  hyponatremia.  The patient to be discharged home after adequate time with  physical therapy and strengthening on April 18, 2005.   DISCHARGE PLAN:  Patient will be discharged home on April 18, 2005.   FOLLOW UP:  Patient is to follow up with Dr. Malon Kindle in two weeks.   Patient has no known  drug allergies.   DISCHARGE MEDICATIONS:  1.  Advicor 1000/20 mg 2 tablets p.o. daily.  2.  Avandia 8 mg p.o. daily.  3.  Levothyroxine 0.075 mg p.o. daily.  4.  Lisinopril 20 mg p.o. daily.  5.  Propranolol/hydrochlorothiazide 1 tablet p.o. daily.  6.  Aspirin 325 mg daily.  7.  Potassium 1 tablet daily.  8.  Percocet 1 tab p.o. q.4-6h. p.r.n. pain.  9.  Robaxin 500 mg p.o. q.6h. p.r.n.  10. Coumadin, per pharmacy protocol.   CONDITION:  Stable.   DIET:  Diabetic.      Thomas B. Dixon, P.A.    ______________________________  Almedia Balls. Ranell Patrick, M.D.    TBD/MEDQ  D:  04/16/2005  T:  04/17/2005  Job:  [id number]

## 2010-07-11 NOTE — Consult Note (Signed)
NAMECHEVY, [name] NO.:  [id number]   MEDICAL RECORD NO.:  [id number]          PATIENT TYPE:  INP   LOCATION:  1511                         FACILITY:  [facility]   PHYSICIAN:  Michaelyn Barter, M.D. DATE OF BIRTH:  April 20, 1935   DATE OF CONSULTATION:  04/15/2005  DATE OF DISCHARGE:                                   CONSULTATION   PRIMARY CARE PHYSICIAN:  Dr. Evelena Peat   This is an [facility] hospitalists consultation for uncontrolled  hypertension.   HISTORY OF PRESENT ILLNESS:  Mr. Fariss is a 75 year old gentleman with a  past medical history of hypertension, hyperthyroidism, and diabetes mellitus  admitted on April 14, 2005.  He underwent a left total knee replacement  secondary to left knee end-stage osteoarthritis.  Following his operation  his blood pressure was discovered to have been significantly elevated.  Therefore, a medicine consult was placed.  Currently, the patient is awake.  He states that he feels pretty good.  He denies having any nausea, vomiting,  fevers, or chills, no shortness of breath, no chest pain.   PAST MEDICAL HISTORY:  1.  Hypertension.  2.  Hyperthyroidism.  3.  Diabetes mellitus.   HOME MEDICATIONS:  1.  Advicor 1000/20 mg two tablets daily.  2.  Avandia 8 mg p.o. daily.  3.  Levothyroxine 0.075 mg p.o. daily.  4.  Lisinopril 20 mg p.o. daily.  5.  Propranolol/hydrochlorothiazide one tablet p.o. daily.  6.  Aspirin 325 mg daily.  7.  Potassium one tablet daily.   SOCIAL HISTORY:  Cigarettes:  The patient denies.  Alcohol:  The patient  denies.   FAMILY HISTORY:  There is a positive history of coronary artery disease and  osteoarthritis.   REVIEW OF SYSTEMS:  As per HPI, otherwise all other systems are negative.   PHYSICAL EXAMINATION:  GENERAL:  The patient is sitting up in bed.  He does  not appear to be in any obvious distress.  He is cooperative.  VITAL SIGNS:  Blood pressure ranges between 186-205  systolically over 98  diastolically, heart rate 114, respirations 20, temperature 100.5.  HEENT:  Anicteric.  Extraocular movements are intact.  Normocephalic,  atraumatic.  NECK:  Supple.  No lymphadenopathy.  Thyroid is not palpable.  CARDIAC:  S1, S2 is present.  Regular rate and rhythm.  ABDOMEN:  Soft, nontender, nondistended.  Positive bowel sounds.  EXTREMITIES:  Left leg is in an ortho device.  Right leg has SED boot  present.  NEUROLOGIC:  The patient is alert and oriented x3.  MUSCULOSKELETAL:  5/5 upper extremity strength.   LABORATORIES:  White blood cell count is 12.8, hemoglobin 12.6, hematocrit  36.4.  PT 14, INR 1.1.  Sodium 132, potassium 3.9, chloride 96, CO2 30, BUN  10, creatinine 1, glucose 189, calcium 8.9.   ASSESSMENT/PLAN:  Hypertension.  This currently is uncontrolled.  Will add  Norvasc 5 mg p.o. daily and will titrate the dose up.  Likewise, will add  labetalol as a p.r.n. medication for systolic blood pressure greater than  180 or diastolic blood pressure greater than  100.  Thank you for the  consult.  Will continue to follow.      Michaelyn Barter, M.D.  Electronically Signed     OR/MEDQ  D:  04/15/2005  T:  04/15/2005  Job:  [id number]   cc:   Evelena Peat, M.D.  Fax: [phone]

## 2010-07-11 NOTE — Op Note (Signed)
NAMEJAZE, RODINO NO.:  [id number]   MEDICAL RECORD NO.:  [id number]          PATIENT TYPE:  INP   LOCATION:  1511                         FACILITY:  [facility]   PHYSICIAN:  Almedia Balls. Ranell Patrick, M.D. DATE OF BIRTH:  [birth year]   DATE OF PROCEDURE:  04/14/2005  DATE OF DISCHARGE:                                 OPERATIVE REPORT   PREOPERATIVE DIAGNOSES:  Left knee end-stage osteoarthritis.   POSTOPERATIVE DIAGNOSES:  Left knee end-stage osteoarthritis.   PROCEDURE:  Left knee total knee replacement using [employer] rotating  platform prosthesis. Size 5 tibia, size 5 femur and 41 patella and 12.5 poly  thickness.   SURGEON:  Almedia Balls. Ranell Patrick, M.D.   ASSISTANT:  Donnie Coffin. Durwin Nora, P.A.   ANESTHESIA:  General anesthesia plus femoral block anesthesia used.   ESTIMATED BLOOD LOSS:  Minimal.   TOURNIQUET TIME:  1 hour and 45 minutes.   FLUIDS REPLACED:  1700 mL crystalloid.   URINE OUTPUT:  650 mL.   INSTRUMENT COUNT:  Correct.   COMPLICATIONS:  None.   ANTIBIOTICS:  Perioperative antibiotics given.   INDICATIONS FOR PROCEDURE:  The patient is a 75 year old male with a history  of worsening left knee pain secondary to end-stage osteoarthritis. He has  failed all conservative measures and presents now with debilitating knee  pain desiring total knee replacement. Informed consent was obtained.   DESCRIPTION OF PROCEDURE:  After an adequate level of anesthesia was  achieved, the patient was positioned supine on the operating table, a  nonsterile tourniquet was placed on the left proximal thigh, the patient had  a 5-10 degree flexion contracture and could flex 220 degrees. We went in and  sterilely prepped and draped the left knee. After exsanguinating the limb  using the esmarch bandage, we elevated the tourniquet to 300 mmHg,  longitudinal skin incision was created from 5 cm proximal to the superior  pole of the patella down to the medial tibial tubercle.  Dissection carried  sharply down through the subcutaneous tissues and medial parapatellar  arthrotomy was performed. We dissected proximally slightly into the VMO  staying out of the quadriceps tendon. At this point, we flexed the knee and  released the lateral patellofemoral ligaments, went ahead and opened the  distal femur using a step cut drill, placed the distal femoral cutting guide  and removed 11 mm off the distal femur, went ahead at that point and did our  sizing which was a size 5 and did our 4 in 1 cutting block with care taken  towards appropriate external rotation on our cut. We set it for 3 degrees of  external rotation off the epicondylar axis. Once the AP and chamfer cuts  were done, we went ahead and released the PCL subluxing the tibia forward  and then went ahead and performed a 90 degree perpendicular cut 2 mm  inferior to the medial most worn area and went ahead and checked our spacer  blocks. We were a little bit tight medially and thus went ahead and released  in full thickness sleeve type fashion the medial soft  tissue structures as a  single periosteal sleeve and we went ahead and removed posterior osteophytes  of the medial and posterolateral femur. At this point, we went ahead and  checked out spacer blocks again, we had symmetric gaps both in flexion  extension. At this point, we went ahead and prepared our notch using the  notch cutting guide for the posterior cruciate substituting prosthesis. Once  this was done, we directed our attention back towards the tibia, sized the  tibia to a size 5 and then went ahead and there was a fair amount of  overhanging medial osteophytes which we removed later. I went ahead and did  our keel punch and drilled the well through the rotating platform tibial  component. At this point, we took the knee through a trial range of motion,  the 10 was a little loose, the 12.5 was perfect both in flexion and  extension with good  stability. At this point, we went ahead and prepared the  patella with a free hand cut. We went from a 26 down to a 15 allowing for  the 41 prosthesis to be placed replacing an 11.5 poly. At this point, I went  ahead and drilled the hole through the 41, placed the patellar component in  and then took the knee through a full range of motion, no tilts or  maltracking was noted. Following this, I removed the trial component, pulse  irrigated the knee and then dried thoroughly using vacuum mixing techniques.  With [employer] set cement, I went ahead and cemented the tibia, femur and  patella in that order, placed the knee in full extension with the 12.5  insert in and allowed the cement to harden. We removed excess cement with an  osteotome. Again checked motion and stability, happy with extension and  balance and flexion and extension. At this point, I went ahead and placed  the real tibial polyethylene component in and took the knee through a full  range of motion with the __________ cement as well including subluxing the  tibia forward. Once the real component, took the knee through a full range  of motion, excellent stability was noted. I went ahead and closed the medial  parapatellar arthrostomy over a drain. We used a hemovac drain out the  lateral gutter in the suprapatellar pouch and then closed with #1 PDS suture  followed by 2-0 Vicryl suture and 4-0 running Monocryl. Steri-Strips were  applied followed a sterile dressing and knee immobilizer. The patient was  taken to the recovery room having tolerated the surgery well.           ______________________________  Almedia Balls. Ranell Patrick, M.D.     SRN/MEDQ  D:  04/14/2005  T:  04/15/2005  Job:  [id number]

## 2010-07-11 NOTE — H&P (Signed)
NAMEANTONI, [name] NO.:  [id number]   MEDICAL RECORD NO.:  [id number]          PATIENT TYPE:   LOCATION:                                 FACILITY:   PHYSICIAN:  Almedia Balls. Ranell Patrick, M.D. DATE OF BIRTH:  07-13-1935   DATE OF ADMISSION:  DATE OF DISCHARGE:                                HISTORY & PHYSICAL   CHIEF COMPLAINT:  Left knee pain.   HISTORY OF PRESENT ILLNESS:  Patient is a healthy 75 year old male who has  been complaining about left knee pain that has been refractory to any type  of conservative treatment.  The patient has elected to have a left total  knee arthroplasty by Dr. Malon Kindle on April 14, 2005.   DRUG ALLERGIES:  None.   MEDICATIONS:  1.  Advicor 1000/20 mg 2 tabs daily.  2.  Avandia 8 mg daily.  3.  Levothyroxine 0.075 mg p.o. daily.  4.  Lisinopril 20 mg p.o. daily.  5.  Propranolol/hydrochlorothiazide 1 pill daily.  6.  Aspirin 325 mg p.o. daily.  7.  Potassium tablet 1 tab p.o. daily.   PAST MEDICAL HISTORY:  1.  Hypertension.  2.  Hyperthyroidism.  3.  Insulin-dependent diabetes.   SOCIAL HISTORY:  A patient of Dr. Evelena Peat.  He does not smoke.  No  alcohol use.  He live in a split level house.   FAMILY HISTORY:  Coronary artery disease, osteoarthritis.   REVIEW OF SYSTEMS:  Negative except for painful ambulation.   PHYSICAL EXAMINATION:  VITAL SIGNS:  Pulse 84, respirations, 20, blood  pressure 142/92.  GENERAL:  This is a healthy-appearing 75 year old male in no acute distress.  Pleasant mood and affect.  Alert and oriented x3.  HEENT/NECK:  Cranial nerves II-XII are grossly intact.  Neck shows full  range of motion without any tenderness.  CHEST:  Active breath sounds bilaterally with no wheezes, rhonchi, or rales.  HEART:  Regular rate and rhythm.  No murmur.  ABDOMEN:  Nontender, nondistended with active bowel sounds.  EXTREMITIES:  Bilateral crepitus to his knees.  Strength is 5/5 bilaterally.  He  does have some mild pedal edema bilaterally.  No rashes are seen.   X-ray shows left knee osteoarthritis, which is severe.   IMPRESSION:  Left knee osteoarthritis, severe.   PLAN:  Left total knee arthroplasty by Dr. Malon Kindle on April 14, 2005.      Thomas B. Dixon, P.A.    ______________________________  Almedia Balls. Ranell Patrick, M.D.    TBD/MEDQ  D:  04/01/2005  T:  04/01/2005  Job:  [id number]

## 2010-10-08 ENCOUNTER — Other Ambulatory Visit: Payer: Self-pay | Admitting: Family Medicine

## 2011-01-01 ENCOUNTER — Encounter: Payer: Self-pay | Admitting: Family Medicine

## 2011-01-01 ENCOUNTER — Ambulatory Visit (INDEPENDENT_AMBULATORY_CARE_PROVIDER_SITE_OTHER): Payer: Medicare HMO | Admitting: Family Medicine

## 2011-01-01 DIAGNOSIS — E039 Hypothyroidism, unspecified: Secondary | ICD-10-CM

## 2011-01-01 DIAGNOSIS — I10 Essential (primary) hypertension: Secondary | ICD-10-CM

## 2011-01-01 DIAGNOSIS — E785 Hyperlipidemia, unspecified: Secondary | ICD-10-CM

## 2011-01-01 DIAGNOSIS — E119 Type 2 diabetes mellitus without complications: Secondary | ICD-10-CM

## 2011-01-01 LAB — BASIC METABOLIC PANEL
Calcium: 9.8 mg/dL (ref 8.4–10.5)
GFR: 70.38 mL/min (ref >60.00)
Potassium: 5 mEq/L (ref 3.5–5.1)
Sodium: 134 mEq/L — ABNORMAL LOW (ref 135–145)

## 2011-01-01 LAB — HEMOGLOBIN A1C: Hgb A1c MFr Bld: 6.8 % — ABNORMAL HIGH (ref 4.6–6.5)

## 2011-01-01 MED ORDER — PROPRANOLOL-HCTZ 40-25 MG PO TABS: 1.0000 | ORAL_TABLET | Freq: Every day | ORAL | Status: DC

## 2011-01-01 MED ORDER — METFORMIN HCL 500 MG PO TABS: 500.0000 mg | ORAL_TABLET | Freq: Two times a day (BID) | ORAL | Status: DC

## 2011-01-01 MED ORDER — LEVOTHYROXINE SODIUM 88 MCG PO TABS: 88.0000 ug | ORAL_TABLET | Freq: Every day | ORAL | Status: DC

## 2011-01-01 NOTE — Progress Notes (Signed)
  Subjective:    Patient ID: [name], male    DOB: 01/24/1936, 75 y.o.   MRN: [id number]  HPI  Follow up multiple medical problems. History of osteoarthritis, hypertension, hyperlipidemia, type 2 diabetes, and hypothyroidism. Needs several refills today. Medications reviewed. Compliant with all. Blood sugar stable fasting around120. A1C consistently around 7. No hyperglycemic symptoms. Blood pressure stable. No orthostasis. History of mild hyponatremia in past. Has just received flu vaccine  Past Medical History  Diagnosis Date  . HYPOTHYROIDISM 05/08/2008  . DM w/o complication type II 05/08/2008  . HYPERLIPIDEMIA 05/08/2008  . Unspecified essential hypertension 05/08/2008  . SUPERFICIAL PHLEBITIS 12/27/2008  . CELLULITIS, RIGHT LEG 04/22/2009  . ECZEMA 05/08/2008  . KNEE, ARTHRITIS, DEGEN./OSTEO 03/25/2009   Past Surgical History  Procedure Date  . Cholecystectomy   . Tonsillectomy 1946  . Nasal polyps   . Knee surgery     TKR Dr Devonne Doughty 2007    reports that he has never smoked. He does not have any smokeless tobacco history on file. His alcohol and drug histories not on file. family history includes Arthritis in his other; Heart disease in his other; and Hypertension in his other. No Known Allergies   Review of Systems  Constitutional: Negative for fatigue.  Eyes: Negative for visual disturbance.  Respiratory: Negative for cough, chest tightness and shortness of breath.   Cardiovascular: Negative for chest pain, palpitations and leg swelling.  Gastrointestinal: Negative for abdominal pain.  Genitourinary: Negative for dysuria and decreased urine volume.  Skin: Negative for rash.  Neurological: Negative for dizziness, syncope, weakness, light-headedness and headaches.       Objective:   Physical Exam  Constitutional: He appears well-developed and well-nourished.  Neck: Neck supple. No thyromegaly present.  Cardiovascular: Normal rate, regular rhythm and normal heart  sounds.   Pulmonary/Chest: Effort normal and breath sounds normal. No respiratory distress. He has no wheezes. He has no rales.  Musculoskeletal:       Patient seen regularly by a podiatrist for foot exams   Lymphadenopathy:    He has no cervical adenopathy.          Assessment & Plan:  #1 type 2 diabetes. History of good control. Repeat A1c. Refill metformin for one year. Continue regular eye exams #2 hypothyroidism. Refill levothyroxine. TSH normal back in May. #3 hypertension stable refill Inderide for one year  #4 hyperlipidemia. Continue Zocor. Lipids were checked in May and reassess at followup in 6 months

## 2011-01-02 NOTE — Progress Notes (Signed)
Quick Note:  Pt informed ______ 

## 2011-01-07 ENCOUNTER — Ambulatory Visit: Payer: Medicare HMO | Admitting: Family Medicine

## 2011-03-25 ENCOUNTER — Other Ambulatory Visit: Payer: Self-pay | Admitting: Family Medicine

## 2011-03-25 MED ORDER — SIMVASTATIN 40 MG PO TABS: 40.0000 mg | ORAL_TABLET | Freq: Every day | ORAL | Status: DC

## 2011-03-25 MED ORDER — BENAZEPRIL HCL 20 MG PO TABS: 20.0000 mg | ORAL_TABLET | Freq: Every day | ORAL | Status: DC

## 2011-03-25 MED ORDER — LEVOTHYROXINE SODIUM 88 MCG PO TABS: 88.0000 ug | ORAL_TABLET | Freq: Every day | ORAL | Status: DC

## 2011-03-25 MED ORDER — AMLODIPINE BESYLATE 10 MG PO TABS: 10.0000 mg | ORAL_TABLET | Freq: Every day | ORAL | Status: DC

## 2011-03-25 MED ORDER — ACCU-CHEK SOFT TOUCH LANCETS MISC: Status: DC

## 2011-03-25 MED ORDER — GLUCOSE BLOOD VI STRP: ORAL_STRIP | Status: DC

## 2011-03-25 MED ORDER — PROPRANOLOL-HCTZ 40-25 MG PO TABS: 1.0000 | ORAL_TABLET | Freq: Every day | ORAL | Status: DC

## 2011-03-25 MED ORDER — METFORMIN HCL 500 MG PO TABS: 500.0000 mg | ORAL_TABLET | Freq: Two times a day (BID) | ORAL | Status: DC

## 2011-03-25 NOTE — Telephone Encounter (Signed)
Pt needs all maintence medications rx's fax to [facility] Annabell Sabal) #90 with 3 refills  [phone] except triamcinolone cream,

## 2011-04-03 ENCOUNTER — Emergency Department (HOSPITAL_COMMUNITY)
Admission: EM | Admit: 2011-04-03 | Discharge: 2011-04-04 | Disposition: A | Discharge status: Home / Self Care | Payer: Medicare Other | Attending: Emergency Medicine | Admitting: Emergency Medicine

## 2011-04-03 ENCOUNTER — Encounter (HOSPITAL_COMMUNITY): Payer: Self-pay

## 2011-04-03 ENCOUNTER — Other Ambulatory Visit: Payer: Self-pay | Admitting: Family Medicine

## 2011-04-03 DIAGNOSIS — E039 Hypothyroidism, unspecified: Secondary | ICD-10-CM | POA: Insufficient documentation

## 2011-04-03 DIAGNOSIS — R319 Hematuria, unspecified: Secondary | ICD-10-CM | POA: Insufficient documentation

## 2011-04-03 DIAGNOSIS — Z79899 Other long term (current) drug therapy: Secondary | ICD-10-CM | POA: Insufficient documentation

## 2011-04-03 DIAGNOSIS — R112 Nausea with vomiting, unspecified: Secondary | ICD-10-CM | POA: Insufficient documentation

## 2011-04-03 DIAGNOSIS — E119 Type 2 diabetes mellitus without complications: Secondary | ICD-10-CM | POA: Insufficient documentation

## 2011-04-03 DIAGNOSIS — I10 Essential (primary) hypertension: Secondary | ICD-10-CM | POA: Insufficient documentation

## 2011-04-03 DIAGNOSIS — E785 Hyperlipidemia, unspecified: Secondary | ICD-10-CM | POA: Insufficient documentation

## 2011-04-03 DIAGNOSIS — Z7982 Long term (current) use of aspirin: Secondary | ICD-10-CM | POA: Insufficient documentation

## 2011-04-03 DIAGNOSIS — L259 Unspecified contact dermatitis, unspecified cause: Secondary | ICD-10-CM | POA: Insufficient documentation

## 2011-04-03 DIAGNOSIS — M1710 Unilateral primary osteoarthritis, unspecified knee: Secondary | ICD-10-CM | POA: Insufficient documentation

## 2011-04-03 DIAGNOSIS — R109 Unspecified abdominal pain: Secondary | ICD-10-CM | POA: Insufficient documentation

## 2011-04-03 LAB — URINE MICROSCOPIC-ADD ON

## 2011-04-03 LAB — BASIC METABOLIC PANEL
BUN: 14 mg/dL (ref 6–23)
Calcium: 9.5 mg/dL (ref 8.4–10.5)
Creatinine, Ser: 0.82 mg/dL (ref 0.50–1.35)
GFR calc Af Amer: >90 mL/min (ref >90)
GFR calc non Af Amer: 86 mL/min — ABNORMAL LOW (ref >90)

## 2011-04-03 LAB — CBC
HCT: 39.6 % (ref 39.0–52.0)
Hemoglobin: 14 g/dL (ref 13.0–17.0)
MCH: 31.7 pg (ref 26.0–34.0)
MCHC: 35.4 g/dL (ref 30.0–36.0)
RDW: 12.4 % (ref 11.5–15.5)

## 2011-04-03 LAB — DIFFERENTIAL
Basophils Absolute: 0 10*3/uL (ref 0.0–0.1)
Basophils Relative: 0 % (ref 0–1)
Eosinophils Absolute: 0.4 10*3/uL (ref 0.0–0.7)
Eosinophils Relative: 5 % (ref 0–5)
Monocytes Absolute: 0.7 10*3/uL (ref 0.1–1.0)
Monocytes Relative: 8 % (ref 3–12)
Neutro Abs: 5.7 10*3/uL (ref 1.7–7.7)

## 2011-04-03 LAB — URINALYSIS, ROUTINE W REFLEX MICROSCOPIC
Bilirubin Urine: NEGATIVE
Glucose, UA: 500 mg/dL — AB
Ketones, ur: NEGATIVE mg/dL
Leukocytes, UA: NEGATIVE
Protein, ur: NEGATIVE mg/dL
pH: 7 (ref 5.0–8.0)

## 2011-04-03 MED ORDER — ONDANSETRON HCL 4 MG/2ML IJ SOLN
INTRAMUSCULAR | Status: AC
  Administered 2011-04-03: 4 mg via INTRAVENOUS
  Filled 2011-04-03: qty 2

## 2011-04-03 MED ORDER — MORPHINE SULFATE 4 MG/ML IJ SOLN
4.0000 mg | Freq: Once | INTRAMUSCULAR | Status: AC
  Administered 2011-04-03: 4 mg via INTRAVENOUS
  Filled 2011-04-03: qty 1

## 2011-04-03 MED ORDER — ACCU-CHEK SOFT TOUCH LANCETS MISC: Status: DC

## 2011-04-03 MED ORDER — GLUCOSE BLOOD VI STRP: ORAL_STRIP | Status: DC

## 2011-04-03 MED ORDER — ONDANSETRON HCL 4 MG/2ML IJ SOLN
4.0000 mg | Freq: Once | INTRAMUSCULAR | Status: AC
  Administered 2011-04-03: 4 mg via INTRAVENOUS
  Filled 2011-04-03: qty 2

## 2011-04-03 MED ORDER — ONDANSETRON HCL 4 MG/2ML IJ SOLN
4.0000 mg | Freq: Once | INTRAMUSCULAR | Status: AC
  Administered 2011-04-03: 4 mg via INTRAVENOUS

## 2011-04-03 NOTE — Telephone Encounter (Signed)
Pt needs new rx lancets/test strips for accu- chek machine. Pt is requesting 90 day supply with refills call into [facility] [phone]

## 2011-04-03 NOTE — ED Provider Notes (Signed)
History     CSN: [id number]  Arrival date & time 04/03/11  2131   First MD Initiated Contact with Patient 04/03/11 2239      Chief Complaint  Patient presents with  . Flank Pain  . Nausea  . Emesis    (Consider location/radiation/quality/duration/timing/severity/associated sxs/prior treatment) HPI Comments: Patient complains of right-sided flank pain for 3 days. He states the pain is dull with intermittent sharp pains. He states tonight the pain became worse and he developed nausea and vomiting.  He states pain occasionally radiates to the low right lower abdomen. He denies any difficulty urinating, hematuria, or pain radiating into his right leg.  No history of previous kidney stones.  Patient is a 76 y.o. male presenting with flank pain and vomiting. The history is provided by the patient. No language interpreter was used.  Flank Pain This is a new problem. The current episode started in the past 7 days. The problem occurs constantly. The problem has been rapidly worsening. Associated symptoms include abdominal pain, nausea and vomiting. Pertinent negatives include no chills, coughing, diaphoresis, fever, headaches, rash, sore throat, urinary symptoms or weakness. The symptoms are aggravated by nothing. He has tried nothing for the symptoms. The treatment provided no relief.  Emesis  Associated symptoms include abdominal pain. Pertinent negatives include no chills, no cough, no diarrhea, no fever and no headaches.    Past Medical History  Diagnosis Date  . HYPOTHYROIDISM 05/08/2008  . DM w/o complication type II 05/08/2008  . HYPERLIPIDEMIA 05/08/2008  . Unspecified essential hypertension 05/08/2008  . SUPERFICIAL PHLEBITIS 12/27/2008  . CELLULITIS, RIGHT LEG 04/22/2009  . ECZEMA 05/08/2008  . KNEE, ARTHRITIS, DEGEN./OSTEO 03/25/2009    Past Surgical History  Procedure Date  . Cholecystectomy   . Tonsillectomy 1946  . Nasal polyps   . Knee surgery     TKR Dr Devonne Doughty 2007     Family History  Problem Relation Age of Onset  . Arthritis Other   . Hypertension Other   . Heart disease Other     History  Substance Use Topics  . Smoking status: Never Smoker   . Smokeless tobacco: Not on file  . Alcohol Use: No      Review of Systems  Constitutional: Negative for fever, chills, diaphoresis and appetite change.  HENT: Negative for sore throat.   Respiratory: Negative for cough.   Cardiovascular: Negative.   Gastrointestinal: Positive for nausea, vomiting and abdominal pain. Negative for diarrhea.  Genitourinary: Positive for flank pain. Negative for dysuria, frequency, hematuria, decreased urine volume, difficulty urinating and testicular pain.  Skin: Negative for rash.  Neurological: Negative for weakness and headaches.  All other systems reviewed and are negative.    Allergies  Review of patient's allergies indicates no known allergies.  Home Medications   Current Outpatient Rx  Name Route Sig Dispense Refill  . AMLODIPINE BESYLATE 10 MG PO TABS Oral Take 1 tablet (10 mg total) by mouth daily. 90 tablet 3  . ASPIRIN EC 325 MG PO TBEC Oral Take 325 mg by mouth daily.    Marland Kitchen BENAZEPRIL HCL 20 MG PO TABS Oral Take 1 tablet (20 mg total) by mouth daily. 90 tablet 3    Dispense as written.  Marland Kitchen GLUCOSE BLOOD VI STRP  Use as directed daily 100 each 3  . ACCU-CHEK SOFT TOUCH LANCETS MISC  Use daily as directed 100 each 3  . LEVOTHYROXINE SODIUM 88 MCG PO TABS Oral Take 1 tablet (88 mcg total) by mouth  daily. 90 tablet 3  . METFORMIN HCL 500 MG PO TABS Oral Take 500-1,000 mg by mouth 2 (two) times daily with a meal. 2 pills in AM, 1 pill in the evening    . FISH OIL 1200 MG PO CAPS Oral Take 1 capsule by mouth daily.    Marland Kitchen POTASSIUM 99 MG PO TABS Oral Take 1 tablet by mouth daily.    Marland Kitchen PROPRANOLOL-HCTZ 40-25 MG PO TABS Oral Take 1 tablet by mouth daily. 90 tablet 3  . SIMVASTATIN 40 MG PO TABS Oral Take 1 tablet (40 mg total) by mouth at bedtime. 90  tablet 3    BP 169/91  Pulse 77  Temp(Src) 98.4 F (36.9 C) (Oral)  Resp 18  Ht 6' (1.829 m)  Wt 205 lb (92.987 kg)  BMI 27.80 kg/m2  SpO2 99%  Physical Exam  Nursing note and vitals reviewed. Constitutional: He is oriented to person, place, and time. He appears well-developed and well-nourished. No distress.  HENT:  Head: Normocephalic and atraumatic.  Neck: Normal range of motion. Neck supple.  Cardiovascular: Normal rate, regular rhythm and normal heart sounds.   Pulmonary/Chest: Effort normal and breath sounds normal. No respiratory distress. He exhibits no tenderness.  Abdominal: Soft. He exhibits no distension. There is no hepatosplenomegaly. There is no tenderness. There is no rigidity, no rebound, no guarding, no CVA tenderness and no tenderness at McBurney's point.  Musculoskeletal: Normal range of motion. He exhibits no edema.  Neurological: He is alert and oriented to person, place, and time. He exhibits normal muscle tone. Coordination normal.  Skin: Skin is warm and dry.    ED Course  Procedures (including critical care time)  Results for orders placed during the hospital encounter of 04/03/11  URINALYSIS, ROUTINE W REFLEX MICROSCOPIC      Component Value Range   Color, Urine YELLOW  YELLOW    APPearance CLEAR  CLEAR    Specific Gravity, Urine 1.015  1.005 - 1.030    pH 7.0  5.0 - 8.0    Glucose, UA 500 (*) NEGATIVE (mg/dL)   Hgb urine dipstick TRACE (*) NEGATIVE    Bilirubin Urine NEGATIVE  NEGATIVE    Ketones, ur NEGATIVE  NEGATIVE (mg/dL)   Protein, ur NEGATIVE  NEGATIVE (mg/dL)   Urobilinogen, UA 0.2  0.0 - 1.0 (mg/dL)   Nitrite NEGATIVE  NEGATIVE    Leukocytes, UA NEGATIVE  NEGATIVE   CBC      Component Value Range   WBC 8.4  4.0 - 10.5 (K/uL)   RBC 4.41  4.22 - 5.81 (MIL/uL)   Hemoglobin 14.0  13.0 - 17.0 (g/dL)   HCT 24.4  01.0 - 27.2 (%)   MCV 89.8  78.0 - 100.0 (fL)   MCH 31.7  26.0 - 34.0 (pg)   MCHC 35.4  30.0 - 36.0 (g/dL)   RDW 53.6   64.4 - 03.4 (%)   Platelets 208  150 - 400 (K/uL)  DIFFERENTIAL      Component Value Range   Neutrophils Relative 68  43 - 77 (%)   Neutro Abs 5.7  1.7 - 7.7 (K/uL)   Lymphocytes Relative 19  12 - 46 (%)   Lymphs Abs 1.6  0.7 - 4.0 (K/uL)   Monocytes Relative 8  3 - 12 (%)   Monocytes Absolute 0.7  0.1 - 1.0 (K/uL)   Eosinophils Relative 5  0 - 5 (%)   Eosinophils Absolute 0.4  0.0 - 0.7 (K/uL)   Basophils  Relative 0  0 - 1 (%)   Basophils Absolute 0.0  0.0 - 0.1 (K/uL)  BASIC METABOLIC PANEL      Component Value Range   Sodium 132 (*) 135 - 145 (mEq/L)   Potassium 3.6  3.5 - 5.1 (mEq/L)   Chloride 95 (*) 96 - 112 (mEq/L)   CO2 26  19 - 32 (mEq/L)   Glucose, Bld 230 (*) 70 - 99 (mg/dL)   BUN 14  6 - 23 (mg/dL)   Creatinine, Ser 4.09  0.50 - 1.35 (mg/dL)   Calcium 9.5  8.4 - 81.1 (mg/dL)   GFR calc non Af Amer 86 (*) >90 (mL/min)   GFR calc Af Amer >90  >90 (mL/min)  URINE MICROSCOPIC-ADD ON      Component Value Range   Squamous Epithelial / LPF RARE  RARE    WBC, UA 0-2  <3 (WBC/hpf)   RBC / HPF 0-2  <3 (RBC/hpf)   Bacteria, UA RARE  RARE     Ct Abdomen Pelvis Wo Contrast  04/04/2011  *RADIOLOGY REPORT*  Clinical Data: Right sided flank pain, nausea and vomiting for 3 days.  CT ABDOMEN AND PELVIS WITHOUT CONTRAST  Technique:  Multidetector CT imaging of the abdomen and pelvis was performed following the standard protocol without intravenous contrast.  Comparison: None.  Findings: The lung bases are clear.  Mild pararenal stranding bilaterally which is nonspecific and may represent normal variation.  Pyelonephritis is not excluded.  No renal, ureteral, or bladder stones.  No pyelocaliectasis or ureterectasis.  No bladder wall thickening.  Surgical absence of the gallbladder.  Calcified granulomas in the liver and spleen. The pancreas and adrenal glands are unremarkable. The stomach and small bowel are decompressed.  Duodenal diverticulum is present.  Calcified and tortuous aorta  without aneurysm.  Stool filled colon without distension or wall thickening.  No free air or free fluid in the abdomen.  No retroperitoneal lymphadenopathy.  Pelvis:  The bladder wall is not thickened.  The prostate gland is not enlarged.  Prostatic calcifications.  No significant pelvic lymphadenopathy.  No free or loculated pelvic fluid collections. Small bilateral inguinal hernias containing fat.  Diverticula in the sigmoid colon without diverticulitis.  The appendix is normal. Degenerative changes in the lumbar spine.  IMPRESSION: Mild pararenal stranding bilaterally is nonspecific but could be associated with pyelonephritis in the appropriate clinical setting. Small bilateral inguinal hernias containing fat. Sigmoid diverticulosis.  Duodenal diverticulum.  Original Report Authenticated By: Marlon Pel, M.D.     Urine culture is pending.    MDM    Patient has received IV fluids, pain medication and anti-emetics. He is feeling better, pain is improving.  Right flank pain with hematuria. Patient is non-toxic appearing w/o CVA tenderness or fever.  Abd remains soft and NT.  Given CT results, I will treat with pain medication, anti-emetic and antibiotic.  Patient was also seen by EDP and care plan was discussed.  His blood sugar is elevated, but states he vomited up his evening dose of metformin.  No clinical signs of DKA.  Patient agrees to close f/u with his PMD, Dr. Caryl Never on Monday      Shlomie Romig L. Lysette Lindenbaum, [place] 04/04/11 0109

## 2011-04-03 NOTE — ED Notes (Signed)
Pt presents with right sided flank pain and n/v x 3 days.

## 2011-04-04 ENCOUNTER — Emergency Department (HOSPITAL_COMMUNITY): Payer: Medicare Other

## 2011-04-04 MED ORDER — MORPHINE SULFATE 4 MG/ML IJ SOLN
4.0000 mg | Freq: Once | INTRAMUSCULAR | Status: AC
  Administered 2011-04-04: 4 mg via INTRAVENOUS
  Filled 2011-04-04: qty 1

## 2011-04-04 MED ORDER — CIPROFLOXACIN HCL 250 MG PO TABS
500.0000 mg | ORAL_TABLET | Freq: Once | ORAL | Status: AC
Start: 2011-04-04 — End: 2011-04-04
  Administered 2011-04-04: 500 mg via ORAL
  Filled 2011-04-04: qty 1

## 2011-04-04 MED ORDER — CIPROFLOXACIN HCL 250 MG PO TABS
ORAL_TABLET | ORAL | Status: AC
  Filled 2011-04-04: qty 1

## 2011-04-04 MED ORDER — ONDANSETRON HCL 4 MG PO TABS: 4.0000 mg | ORAL_TABLET | Freq: Four times a day (QID) | ORAL | Status: DC

## 2011-04-04 MED ORDER — CIPROFLOXACIN HCL 250 MG PO TABS: 250.0000 mg | ORAL_TABLET | Freq: Two times a day (BID) | ORAL | Status: DC

## 2011-04-04 MED ORDER — OXYCODONE-ACETAMINOPHEN 5-325 MG PO TABS: 1.0000 | ORAL_TABLET | ORAL | Status: DC | PRN

## 2011-04-04 NOTE — ED Provider Notes (Signed)
Medical screening examination/treatment/procedure(s) were conducted as a shared visit with non-physician practitioner(s) and myself.  I personally evaluated the patient during the encounter  [name]. Colon Branch, MD 04/04/11 [phone]

## 2011-04-04 NOTE — ED Provider Notes (Signed)
Patient with right flank pain that has been  present for 3 days associated with nausea and vomiting. Mild R CVA tenderness. No abdominal tenderness to palpation. CT scan negative for kidney stone. Suggests pyelonephritis. Suspect previous passed stone with resultant pyelonephritis. Treatment with antibiotics, analgesics and antiemetics.   Nicoletta Dress. Colon Branch, MD 04/04/11 [phone]

## 2011-04-05 LAB — URINE CULTURE
Colony Count: NO GROWTH
Culture  Setup Time: 201302092109
Culture: NO GROWTH

## 2011-04-06 ENCOUNTER — Ambulatory Visit (INDEPENDENT_AMBULATORY_CARE_PROVIDER_SITE_OTHER): Payer: Medicare Other | Admitting: Family Medicine

## 2011-04-06 ENCOUNTER — Encounter: Payer: Self-pay | Admitting: Family Medicine

## 2011-04-06 VITALS — BP 138/72 | Temp 99.1°F | Wt 212.0 lb

## 2011-04-06 DIAGNOSIS — K5900 Constipation, unspecified: Secondary | ICD-10-CM

## 2011-04-06 DIAGNOSIS — L309 Dermatitis, unspecified: Secondary | ICD-10-CM

## 2011-04-06 DIAGNOSIS — R109 Unspecified abdominal pain: Secondary | ICD-10-CM

## 2011-04-06 DIAGNOSIS — L259 Unspecified contact dermatitis, unspecified cause: Secondary | ICD-10-CM

## 2011-04-06 MED ORDER — DESOXIMETASONE 0.25 % EX CREA: TOPICAL_CREAM | Freq: Two times a day (BID) | CUTANEOUS | Status: DC

## 2011-04-06 MED FILL — Oxycodone w/ Acetaminophen Tab 5-325 MG: ORAL | Qty: 6 | Status: AC

## 2011-04-06 NOTE — Patient Instructions (Signed)
Stop pain medication Drink plenty of fluids Stop Cipro Miralax if needed for constipation

## 2011-04-06 NOTE — Progress Notes (Signed)
  Subjective:    Patient ID: [name], male    DOB: [birth year], 76 y.o.   MRN: [id number]  HPI  Emergency room followup. Patient developed some intermittent right flank pain about 4 or 5 days ago. Pain ceased about last Thursday then Friday had acute episode of very severe right flank pain. Pain radiated slightly anterior and inferior. No history of kidney stones. No dysuria. Patient also developed some nausea and vomiting. No fever. Trace blood on urine dipstick. Urine culture negative. Lab work unremarkable. CT abdomen and pelvis no acute abnormality. No visible stone. Patient prescribed Percocet and Zofran. He remains on pain medicine and has some constipation this time. No further severe flank pain. No fever or chills.  Separate new issue of intermittent leg rash mostly left medial leg. Has tried over-the-counter moisturizers without improvement. Rash is pruritic. Nonpainful.  Type 2 diabetes well controlled.  Past Medical History  Diagnosis Date  . HYPOTHYROIDISM 05/08/2008  . DM w/o complication type II 05/08/2008  . HYPERLIPIDEMIA 05/08/2008  . Unspecified essential hypertension 05/08/2008  . SUPERFICIAL PHLEBITIS 12/27/2008  . CELLULITIS, RIGHT LEG 04/22/2009  . ECZEMA 05/08/2008  . KNEE, ARTHRITIS, DEGEN./OSTEO 03/25/2009   Past Surgical History  Procedure Date  . Cholecystectomy   . Tonsillectomy 1946  . Nasal polyps   . Knee surgery     TKR Dr Devonne Doughty 2007    reports that he has never smoked. He does not have any smokeless tobacco history on file. He reports that he does not drink alcohol or use illicit drugs. family history includes Arthritis in his other; Heart disease in his other; and Hypertension in his other. No Known Allergies    Review of Systems  Constitutional: Negative for fever, chills and unexpected weight change.  Respiratory: Negative for cough and shortness of breath.   Cardiovascular: Negative for chest pain.  Gastrointestinal: Negative for abdominal  pain.  Genitourinary: Negative for dysuria, hematuria and testicular pain.  Skin: Positive for rash.       Objective:   Physical Exam  Constitutional: He appears well-developed and well-nourished. No distress.  Cardiovascular: Normal rate and regular rhythm.   Pulmonary/Chest: Effort normal and breath sounds normal. No respiratory distress. He has no wheezes. He has no rales.  Abdominal: Soft. Bowel sounds are normal. He exhibits no distension. There is no tenderness. There is no rebound and no guarding.  Skin:       Patient has scaly well-demarcated erythematous blanching rash left medial leg. No pustules. No vesicles.          Assessment & Plan:  #1 recent acute right flank pain. Suspect he had acute kidney stone which has passed at this point. Urine culture negative. Stop Cipro this point. Stop Percocet. Follow up if any recurrent pain #2 constipation probably related to recent opioids. Discontinue Percocet. Use MiraLax as needed #3 eczematous rash left leg. Topicort 0.25% cream twice a day as needed

## 2011-04-10 ENCOUNTER — Telehealth: Payer: Self-pay | Admitting: Family Medicine

## 2011-04-10 NOTE — Telephone Encounter (Signed)
LMTCB  Amlodipine #90 RF 3 to [facility] 03/25/11 Simvatatin "                                               " Metformin #270 RF 3 on 04-03-11  Lancets [facility] test strips [facility]

## 2011-04-10 NOTE — Telephone Encounter (Signed)
Patient spouse called stating that there were several issues with rxs sent to [facility], such as some were illegible and instructions were not clear on some and his benazipril was a Tier 1 and patients insurance covered it but the MD changed to lotensin and the insurance will not cover this. Patient spouse stated that [facility] has contacted the MD office with no results. Patient also stated that there was a drug interaction alert per the company and they never received feed back from the MD office. Patients spouse asks for a call back to explain and resolve the matter. Please assist.

## 2011-04-15 ENCOUNTER — Telehealth: Payer: Self-pay | Admitting: Family Medicine

## 2011-04-15 MED ORDER — BENAZEPRIL HCL 20 MG PO TABS: 20.0000 mg | ORAL_TABLET | Freq: Every day | ORAL | Status: DC

## 2011-04-15 NOTE — Telephone Encounter (Signed)
Please explain this note.  What is meant by "some prescriptions were illegible"?  Our prescriptions are all electronic so this is not possible.  Did they mix up his prescriptions with another pt?

## 2011-04-15 NOTE — Telephone Encounter (Signed)
The

## 2011-04-15 NOTE — Telephone Encounter (Signed)
Please call mail order Rx right away. They are confused about the benazepril. Thanks!

## 2011-04-15 NOTE — Telephone Encounter (Signed)
The lotensin problem has been fixed, we had DAW was on pt med, probably by mistake.  New generic rx sent, pt informed

## 2011-04-15 NOTE — Telephone Encounter (Signed)
I called pt again, things seem to have worked out [employer] with [employer], that's where the mix up was

## 2011-04-17 ENCOUNTER — Ambulatory Visit: Payer: Medicare Other | Admitting: Internal Medicine

## 2011-04-17 ENCOUNTER — Ambulatory Visit: Payer: Medicare Other | Admitting: Family Medicine

## 2011-05-15 ENCOUNTER — Encounter: Payer: Self-pay | Admitting: Gastroenterology

## 2011-07-01 ENCOUNTER — Ambulatory Visit: Payer: Medicare HMO | Admitting: Family Medicine

## 2011-07-02 ENCOUNTER — Ambulatory Visit (INDEPENDENT_AMBULATORY_CARE_PROVIDER_SITE_OTHER): Payer: Medicare Other | Admitting: Family Medicine

## 2011-07-02 ENCOUNTER — Encounter: Payer: Self-pay | Admitting: Family Medicine

## 2011-07-02 VITALS — BP 140/80 | HR 72 | Temp 98.2°F | Resp 12 | Wt 207.0 lb

## 2011-07-02 DIAGNOSIS — E119 Type 2 diabetes mellitus without complications: Secondary | ICD-10-CM

## 2011-07-02 DIAGNOSIS — E785 Hyperlipidemia, unspecified: Secondary | ICD-10-CM

## 2011-07-02 DIAGNOSIS — IMO0001 Reserved for inherently not codable concepts without codable children: Secondary | ICD-10-CM

## 2011-07-02 DIAGNOSIS — I10 Essential (primary) hypertension: Secondary | ICD-10-CM

## 2011-07-02 DIAGNOSIS — E039 Hypothyroidism, unspecified: Secondary | ICD-10-CM

## 2011-07-02 LAB — HEPATIC FUNCTION PANEL
ALT: 27 U/L (ref 0–53)
AST: 25 U/L (ref 0–37)
Albumin: 4.2 g/dL (ref 3.5–5.2)
Alkaline Phosphatase: 34 U/L — ABNORMAL LOW (ref 39–117)

## 2011-07-02 LAB — BASIC METABOLIC PANEL
Calcium: 9.5 mg/dL (ref 8.4–10.5)
GFR: 68.82 mL/min (ref >60.00)
Glucose, Bld: 135 mg/dL — ABNORMAL HIGH (ref 70–99)
Potassium: 4.2 mEq/L (ref 3.5–5.1)
Sodium: 137 mEq/L (ref 135–145)

## 2011-07-02 LAB — TSH: TSH: 6.19 u[IU]/mL — ABNORMAL HIGH (ref 0.35–5.50)

## 2011-07-02 LAB — LIPID PANEL
Cholesterol: 118 mg/dL (ref 0–200)
HDL: 35.3 mg/dL — ABNORMAL LOW (ref >39.00)

## 2011-07-02 NOTE — Progress Notes (Signed)
  Subjective:    Patient ID: [name], male    DOB: [birth year], 76 y.o.   MRN: [id number]  HPI  Medical followup. Patient states diabetes, hypertension, hyperlipidemia, and hypothyroidism. Compliant with all medications. Denies side effects. Fasting blood sugars averaging 100 -130. No hypoglycemia. Not monitoring blood pressures. Denies any headaches or dizziness. No chest pain. No change in urine or stool habits. He sees podiatrist regularly for foot exams and also sees ophthalmology once per year. No history of retinopathy.  Past Medical History  Diagnosis Date  . HYPOTHYROIDISM 05/08/2008  . DM w/o complication type II 05/08/2008  . HYPERLIPIDEMIA 05/08/2008  . Unspecified essential hypertension 05/08/2008  . SUPERFICIAL PHLEBITIS 12/27/2008  . CELLULITIS, RIGHT LEG 04/22/2009  . ECZEMA 05/08/2008  . KNEE, ARTHRITIS, DEGEN./OSTEO 03/25/2009   Past Surgical History  Procedure Date  . Cholecystectomy   . Tonsillectomy 1946  . Nasal polyps   . Knee surgery     TKR Dr Devonne Doughty 2007    reports that he has never smoked. He does not have any smokeless tobacco history on file. He reports that he does not drink alcohol or use illicit drugs. family history includes Arthritis in his other; Heart disease in his other; and Hypertension in his other. No Known Allergies   Review of Systems  Constitutional: Negative for fatigue.  Eyes: Negative for visual disturbance.  Respiratory: Negative for cough, chest tightness and shortness of breath.   Cardiovascular: Negative for chest pain, palpitations and leg swelling.  Neurological: Negative for dizziness, syncope, weakness, light-headedness and headaches.       Objective:   Physical Exam  Constitutional: He is oriented to person, place, and time. He appears well-developed and well-nourished.  HENT:  Mouth/Throat: Oropharynx is clear and moist.  Neck: Neck supple. No thyromegaly present.  Cardiovascular: Normal rate, regular rhythm and  normal heart sounds.   Pulmonary/Chest: Effort normal and breath sounds normal. No respiratory distress. He has no wheezes. He has no rales.  Musculoskeletal: He exhibits no edema.  Lymphadenopathy:    He has no cervical adenopathy.  Neurological: He is alert and oriented to person, place, and time.          Assessment & Plan:  #1 type 2 diabetes. History of good control. Repeat A1c #2 hypertension stable continue current medications #3 dyslipidemia. Recheck lipid and hepatic panel #4 hypothyroidism. Check TSH.

## 2011-07-06 ENCOUNTER — Other Ambulatory Visit: Payer: Self-pay | Admitting: *Deleted

## 2011-07-06 DIAGNOSIS — E039 Hypothyroidism, unspecified: Secondary | ICD-10-CM

## 2011-07-06 MED ORDER — LEVOTHYROXINE SODIUM 100 MCG PO TABS: 100.0000 ug | ORAL_TABLET | Freq: Every day | ORAL | Status: DC

## 2011-07-06 NOTE — Progress Notes (Signed)
Quick Note:  Pt informed, future labs ordered ______ 

## 2011-08-05 LAB — HM DIABETES EYE EXAM

## 2011-09-28 ENCOUNTER — Other Ambulatory Visit (INDEPENDENT_AMBULATORY_CARE_PROVIDER_SITE_OTHER): Payer: Medicare Other

## 2011-09-28 DIAGNOSIS — E039 Hypothyroidism, unspecified: Secondary | ICD-10-CM

## 2011-09-29 ENCOUNTER — Other Ambulatory Visit: Payer: Self-pay | Admitting: *Deleted

## 2011-09-29 MED ORDER — LEVOTHYROXINE SODIUM 100 MCG PO TABS: 100.0000 ug | ORAL_TABLET | Freq: Every day | ORAL | Status: DC

## 2011-09-29 NOTE — Progress Notes (Signed)
Quick Note:  Pt wife informed. She requested a refill of new dose thyroid be sent to their mail order, [facility] ______

## 2012-01-01 ENCOUNTER — Ambulatory Visit (INDEPENDENT_AMBULATORY_CARE_PROVIDER_SITE_OTHER): Payer: Medicare Other | Admitting: Family Medicine

## 2012-01-01 ENCOUNTER — Encounter: Payer: Self-pay | Admitting: Family Medicine

## 2012-01-01 VITALS — BP 140/80 | Temp 97.7°F | Wt 204.0 lb

## 2012-01-01 DIAGNOSIS — I10 Essential (primary) hypertension: Secondary | ICD-10-CM

## 2012-01-01 DIAGNOSIS — E119 Type 2 diabetes mellitus without complications: Secondary | ICD-10-CM

## 2012-01-01 DIAGNOSIS — E039 Hypothyroidism, unspecified: Secondary | ICD-10-CM

## 2012-01-01 DIAGNOSIS — E785 Hyperlipidemia, unspecified: Secondary | ICD-10-CM

## 2012-01-01 NOTE — Progress Notes (Addendum)
Subjective:     Patient ID: [name], male   DOB: January 08, 1936, 75 y.o.   MRN: [id number]  HPI 22-month follow-up.  Medical problems include T2DM, HTN, hypothyroidism, and hyperlipidemia.  Compliant with all medications.    Pt doing very well, reports fasting BS frequently <120, though noted 128 this AM.  Tries to walk 1 mile/day with his wife.  Sees a podiatrist and has twice yearly eye exams, all of which have been stable.  Denies increased thirst, urination, numbness or neuropathy.  Does not check BPs on his own.  140/80 today is at or slightly above pt's baseline.  Denies HA, dizziness or vision changes.  Denies fatigue or changes in bowel habits.  Already received flu shot this year.  Review of Systems  Constitutional: Negative for activity change, fatigue and unexpected weight change.  Eyes: Negative for visual disturbance.  Respiratory: Negative for shortness of breath.   Cardiovascular: Negative for chest pain.  Genitourinary: Negative for frequency.  Neurological: Negative for dizziness and headaches.       Objective:   Physical Exam  Constitutional: He is oriented to person, place, and time. He appears well-developed and well-nourished. No distress.  HENT:  Head: Normocephalic and atraumatic.  Cardiovascular: Normal rate, regular rhythm and normal heart sounds.   Pulmonary/Chest: Effort normal. No respiratory distress.  Neurological: He is alert and oriented to person, place, and time.  Skin: Skin is warm and dry.       Assessment:     76 year old with history of T2DM, HTN, hypothyroidism, and hyperlipidemia in for 81-month follow-up.    Plan:     1. T2DM: currently well-controlled with metformin 1000mg  qAM, 500mg  qHS, diet and exercise.  Per pt, podiatry and ophthalmology exams have all been negative.  Re-check A1C today.  Continue current regimen. 2. HTN: 140/80 today, SBP still less than optimal.  Pt already on several antihypertensives, not severe enough to  change meds.  Continue diet and exercise habits. 3. Hypothyroid: currently well-controlled on levothyroxine.  Last TSH 4.14 in Aug 2013, improved after increasing levothyroxine dose to daily.  Continue current regimen. 4. Hyperlipidemia: most recent lipid panel in May 2013 triglycerides 162, HDL 35.  Continue on simvastatin and with diet and exercise habits.  Re-check lipids at next visit. 5. Follow-up in 6 months.  Marthann Schiller, MS3     Agree with assessment and plan as per Marthann Schiller, MS 3 Evelena Peat MD

## 2012-01-01 NOTE — Progress Notes (Signed)
Quick Note:  Pt informed on home VM ______ 

## 2012-05-05 ENCOUNTER — Other Ambulatory Visit: Payer: Self-pay | Admitting: *Deleted

## 2012-05-05 MED ORDER — BENAZEPRIL HCL 20 MG PO TABS: 20.0000 mg | ORAL_TABLET | Freq: Every day | ORAL | Status: DC

## 2012-05-05 MED ORDER — METFORMIN HCL 500 MG PO TABS: ORAL_TABLET | ORAL | Status: DC

## 2012-05-05 MED ORDER — SIMVASTATIN 40 MG PO TABS: 40.0000 mg | ORAL_TABLET | Freq: Every day | ORAL | Status: DC

## 2012-05-06 ENCOUNTER — Other Ambulatory Visit: Payer: Self-pay | Admitting: *Deleted

## 2012-05-06 MED ORDER — AMLODIPINE BESYLATE 10 MG PO TABS: 10.0000 mg | ORAL_TABLET | Freq: Every day | ORAL | Status: DC

## 2012-05-06 MED ORDER — PROPRANOLOL-HCTZ 40-25 MG PO TABS: 1.0000 | ORAL_TABLET | Freq: Every day | ORAL | Status: DC

## 2012-05-13 ENCOUNTER — Telehealth: Payer: Self-pay | Admitting: *Deleted

## 2012-05-13 MED ORDER — HYDROCHLOROTHIAZIDE 25 MG PO TABS: 25.0000 mg | ORAL_TABLET | Freq: Every day | ORAL | Status: DC

## 2012-05-13 MED ORDER — PROPRANOLOL HCL 40 MG PO TABS: 40.0000 mg | ORAL_TABLET | Freq: Three times a day (TID) | ORAL | Status: DC

## 2012-05-13 NOTE — Telephone Encounter (Signed)
Pt currently taking generic Inderide 40-25 once daily ($83 for 90 days).  We received a pharmacy request from [facility] asking if these meds can be filled seperately  As they will be less expensive for the patient ($0 copay for pt 90 days.  I did check with pt and he is OK with change.

## 2012-05-13 NOTE — Telephone Encounter (Signed)
OK to split out-Inderal 40 mg daily and HCTZ 25 mg daily

## 2012-05-31 ENCOUNTER — Ambulatory Visit (INDEPENDENT_AMBULATORY_CARE_PROVIDER_SITE_OTHER): Payer: Medicare Other

## 2012-05-31 ENCOUNTER — Ambulatory Visit (INDEPENDENT_AMBULATORY_CARE_PROVIDER_SITE_OTHER): Payer: Medicare Other | Admitting: Orthopedic Surgery

## 2012-05-31 ENCOUNTER — Encounter: Payer: Self-pay | Admitting: Orthopedic Surgery

## 2012-05-31 VITALS — BP 104/76 | Ht 73.0 in | Wt 200.0 lb

## 2012-05-31 DIAGNOSIS — M25531 Pain in right wrist: Secondary | ICD-10-CM

## 2012-05-31 DIAGNOSIS — M25539 Pain in unspecified wrist: Secondary | ICD-10-CM

## 2012-05-31 MED ORDER — TRAMADOL-ACETAMINOPHEN 37.5-325 MG PO TABS: 1.0000 | ORAL_TABLET | ORAL | Status: DC | PRN

## 2012-05-31 NOTE — Patient Instructions (Addendum)
Referral to Dr Butler Denmark   For possible wrist fusion or wrist joint replacement

## 2012-05-31 NOTE — Progress Notes (Signed)
Patient ID: [name], male   DOB: [birth year], 77 y.o.   MRN: [id number] Chief Complaint  Patient presents with  . Wrist Pain    Right wrist pain, no injury      This is a 77 year old male with recent onset of pain in his right wrist which came on gradually. He complains of dull 7/10 intermittent pain after using his hand. His symptoms are relieved by rest. He denies bruising numbness tingling locking catching or swelling. Review of systems negative except for the joint pain is described  Medical problems he has no allergies  He does have diabetes hypertension osteoarthritis thyroid disease high cholesterol  He's had a tonsillectomy a cholecystectomy, knee joint replacement on the left  Knee joint replacement on the right  Nasal polypectomy  Local pharmacy is [facility] and he also uses a mail pharmacy  Family physician is Dr. Caryl Never  Family history for heart disease and arthritis  He is retired married does not smoke or drink  His medications include levothyroxine 88 mcg, metformin 500 mg 2 in the morning one at night propanolol 40 mg daily simvastatin 40 mg daily amlodipine 10 mg daily benazepril 20 mg daily hydrochlorothiazide 25 mg daily enteric-coated aspirin 325 mg fish oil with omega-3 and potassium   Vital signs are stable as recorded BP 104/76  Ht 6\' 1"  (1.854 m)  Wt 200 lb (90.719 kg)  BMI 26.39 kg/m2   General appearance is normal  The patient is alert and oriented x3  The patient's mood and affect are normal  Gait assessment: No disturbance   The cardiovascular exam reveals normal pulses and temperature without edema or  swelling.  The lymphatic system is negative for palpable lymph nodes  The sensory exam is normal.  There are no pathologic reflexes.  Balance is normal.   Exam of the  Right hand and wrist Initially we see over the thumb a large mass which appears to be either a ganglion or subcutaneous pain he is lipoma. Then we note 20  of wrist extension 0 of wrist flexion. Pronation supination are also limited. The wrist joint is stable there is no major weakness in the strength in terms of grip the skin has no. 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

## 2012-06-01 ENCOUNTER — Other Ambulatory Visit: Payer: Self-pay | Admitting: *Deleted

## 2012-06-01 ENCOUNTER — Telehealth: Payer: Self-pay | Admitting: *Deleted

## 2012-06-01 DIAGNOSIS — M25531 Pain in right wrist: Secondary | ICD-10-CM

## 2012-06-01 NOTE — Telephone Encounter (Signed)
Faxed referral and office notes to [facility]. Awaiting appointment. 

## 2012-06-02 NOTE — Telephone Encounter (Signed)
Appointment with Dr. Amanda Pea 07/27/12 at 9:00 am. Patient aware.

## 2012-07-01 ENCOUNTER — Encounter: Payer: Self-pay | Admitting: Family Medicine

## 2012-07-01 ENCOUNTER — Telehealth: Payer: Self-pay | Admitting: Family Medicine

## 2012-07-01 ENCOUNTER — Ambulatory Visit (INDEPENDENT_AMBULATORY_CARE_PROVIDER_SITE_OTHER): Payer: Medicare Other | Admitting: Family Medicine

## 2012-07-01 VITALS — BP 130/80 | Temp 98.9°F | Wt 204.0 lb

## 2012-07-01 DIAGNOSIS — E119 Type 2 diabetes mellitus without complications: Secondary | ICD-10-CM

## 2012-07-01 DIAGNOSIS — E039 Hypothyroidism, unspecified: Secondary | ICD-10-CM

## 2012-07-01 DIAGNOSIS — E785 Hyperlipidemia, unspecified: Secondary | ICD-10-CM

## 2012-07-01 DIAGNOSIS — I10 Essential (primary) hypertension: Secondary | ICD-10-CM

## 2012-07-01 LAB — BASIC METABOLIC PANEL
Calcium: 9.5 mg/dL (ref 8.4–10.5)
Creatinine, Ser: 1.3 mg/dL (ref 0.4–1.5)
GFR: 56.2 mL/min — ABNORMAL LOW (ref >60.00)
Sodium: 133 mEq/L — ABNORMAL LOW (ref 135–145)

## 2012-07-01 LAB — LIPID PANEL
Cholesterol: 125 mg/dL (ref 0–200)
HDL: 29.4 mg/dL — ABNORMAL LOW (ref >39.00)
Triglycerides: 133 mg/dL (ref 0.0–149.0)

## 2012-07-01 LAB — HEPATIC FUNCTION PANEL
ALT: 26 U/L (ref 0–53)
AST: 24 U/L (ref 0–37)
Albumin: 4.2 g/dL (ref 3.5–5.2)
Alkaline Phosphatase: 33 U/L — ABNORMAL LOW (ref 39–117)

## 2012-07-01 LAB — HM DIABETES FOOT EXAM: HM Diabetic Foot Exam: NORMAL

## 2012-07-01 LAB — TSH: TSH: 4.9 u[IU]/mL (ref 0.35–5.50)

## 2012-07-01 NOTE — Telephone Encounter (Signed)
Pt was seen today and was requesting to find out when was his last colonoscopy. Pt last colonoscopy was 04-15-2004 by [facility]

## 2012-07-01 NOTE — Progress Notes (Signed)
  Subjective:    Patient ID: [name], male    DOB: 1935-09-25, 77 y.o.   MRN: [id number]  HPI Followup multiple medical problems. Type 2 diabetes has been generally well-controlled. No recent hypoglycemia. A1c is consistently around 7. Compliant with all medications. Due for repeat exam soon. No history of nephropathy, neuropathy, or retinopathy  Hypertension which is stable. No recent dizziness. No headaches. Hyperlipidemia treated with simvastatin. He has hypothyroidism treated with levothyroxin. Energy levels are stable. No recent falls. No dysuria  Past Medical History  Diagnosis Date  . HYPOTHYROIDISM 05/08/2008  . DM w/o complication type II 05/08/2008  . HYPERLIPIDEMIA 05/08/2008  . Unspecified essential hypertension 05/08/2008  . SUPERFICIAL PHLEBITIS 12/27/2008  . CELLULITIS, RIGHT LEG 04/22/2009  . ECZEMA 05/08/2008  . KNEE, ARTHRITIS, DEGEN./OSTEO 03/25/2009   Past Surgical History  Procedure Laterality Date  . Cholecystectomy    . Tonsillectomy  1946  . Nasal polyps    . Knee surgery      TKR Dr Devonne Doughty 2007    reports that he has never smoked. He does not have any smokeless tobacco history on file. He reports that he does not drink alcohol or use illicit drugs. family history includes Arthritis in his other; Heart disease in his other; and Hypertension in his other. No Known Allergies      Review of Systems  Constitutional: Negative for fatigue and unexpected weight change.  Eyes: Negative for visual disturbance.  Respiratory: Negative for cough, chest tightness and shortness of breath.   Cardiovascular: Negative for chest pain, palpitations and leg swelling.  Neurological: Negative for dizziness, syncope, weakness, light-headedness and headaches.       Objective:   Physical Exam  Constitutional: He is oriented to person, place, and time. He appears well-developed and well-nourished. No distress.  HENT:  Head: Normocephalic and atraumatic.  Right  Ear: External ear normal.  Left Ear: External ear normal.  Mouth/Throat: Oropharynx is clear and moist.  Eyes: Conjunctivae and EOM are normal. Pupils are equal, round, and reactive to light.  Neck: Normal range of motion. Neck supple. No thyromegaly present.  Cardiovascular: Normal rate, regular rhythm and normal heart sounds.   No murmur heard. Pulmonary/Chest: No respiratory distress. He has no wheezes. He has no rales.  Musculoskeletal: He exhibits no edema.  Lymphadenopathy:    He has no cervical adenopathy.  Neurological: He is alert and oriented to person, place, and time. No cranial nerve deficit.  Skin: No rash noted.  Feet reveal no skin lesions. Good distal foot pulses. Good capillary refill. No calluses. Normal sensation with monofilament testing   Psychiatric: He has a normal mood and affect.          Assessment & Plan:  #1 type 2 diabetes. History of good control. Recheck A1c. Set up eye exam #2 hypertension. Stable at goal #3 hypothyroidism. Recheck TSH #4 hyperlipidemia. Recheck lipid and hepatic panel

## 2012-07-01 NOTE — Telephone Encounter (Signed)
I have abstracted 2006 colonscopy to pt chart.  FYI

## 2012-07-27 ENCOUNTER — Other Ambulatory Visit: Payer: Self-pay | Admitting: Family Medicine

## 2012-07-27 MED ORDER — LEVOTHYROXINE SODIUM 100 MCG PO TABS: 100.0000 ug | ORAL_TABLET | Freq: Every day | ORAL | Status: DC

## 2012-08-03 ENCOUNTER — Telehealth: Payer: Self-pay | Admitting: Family Medicine

## 2012-08-03 NOTE — Telephone Encounter (Signed)
Patient Information:  Caller Name: Octavio Graves  Phone: [phone]  Patient: [name], [name]  Gender: Male  DOB: 16-May-1935  Age: 77 Years  PCP: Evelena Peat [facility])  Office Follow Up:  Does the office need to follow up with this patient?: No  Instructions For The Office: N/A  RN Note:  Confirmed per Epic no known drug allergies.  Z-pack (Azithromycin), dispense one pack; sig: 2 tabs (500 mg) po day one, then 1 tab (250 mg) daily days 2-5, no refills [facility] [place] [phone] per MD Pertussis exposure protocol.  Symptoms  Reason For Call & Symptoms: Exposed daily to critcally ill grandchild in PICU at [facility].  Child diagnosed with Pertussis and strep infection.  Hospitalist advised  grandparents be treated due to Pertussis exposure.  Asymptomatic. [facility] [place].  Reviewed Health History In EMR: Yes  Reviewed Medications In EMR: Yes  Reviewed Allergies In EMR: Yes  Reviewed Surgeries / Procedures: Yes  Date of Onset of Symptoms: 07/29/2012  Guideline(s) Used:  Infection Exposure Questions  No Protocol Available - Information Only  Disposition Per Guideline:   Discuss with PCP and Callback by Nurse within 1 Hour  Reason For Disposition Reached:   Nursing judgment  Advice Given:  Call Back If:  New symptoms develop  RN Overrode Recommendation:  Document Patient  RX standing orders used. No call back needed.

## 2012-08-03 NOTE — Telephone Encounter (Signed)
FYI

## 2012-11-03 ENCOUNTER — Telehealth: Payer: Self-pay | Admitting: Family Medicine

## 2012-11-03 NOTE — Telephone Encounter (Signed)
Patient requesting call back to help him clarify his propranolol (INDERAL) 40 MG tablet rx. He states that previously with the March rx, he was taking it once daily. Now, he says the rx states to take TID.  Additionally, it looks like in March he was taking the propranolol-hydrochlorothiazide (INDERIDE) 40-25 MG per tablet, though he thought he was only taking the 40mg  non-HCTZ. He needs clarification on both the HCTZ or non-HCTZ, as well as the SIG. Thank you.

## 2012-11-04 NOTE — Telephone Encounter (Signed)
Put the correct medication on med list and pt is informed

## 2012-11-04 NOTE — Telephone Encounter (Signed)
Inderal should be ONCE daily and we need to correct this on med list We need to d/c Inderide (take off his list)

## 2012-11-30 LAB — HM DIABETES EYE EXAM

## 2012-12-13 ENCOUNTER — Other Ambulatory Visit: Payer: Self-pay

## 2012-12-13 MED ORDER — GLUCOSE BLOOD VI STRP: ORAL_STRIP | Status: DC

## 2012-12-29 ENCOUNTER — Other Ambulatory Visit: Payer: Self-pay

## 2013-01-02 ENCOUNTER — Encounter: Payer: Self-pay | Admitting: Family Medicine

## 2013-01-02 ENCOUNTER — Ambulatory Visit (INDEPENDENT_AMBULATORY_CARE_PROVIDER_SITE_OTHER): Payer: Medicare Other | Admitting: Family Medicine

## 2013-01-02 VITALS — BP 132/80 | HR 68 | Temp 97.9°F | Wt 200.0 lb

## 2013-01-02 DIAGNOSIS — E119 Type 2 diabetes mellitus without complications: Secondary | ICD-10-CM

## 2013-01-02 LAB — HEMOGLOBIN A1C: Hgb A1c MFr Bld: 7.2 % — ABNORMAL HIGH (ref 4.6–6.5)

## 2013-01-02 MED ORDER — DESOXIMETASONE 0.25 % EX CREA: 1.0000 "application " | TOPICAL_CREAM | CUTANEOUS | Status: DC | PRN

## 2013-01-02 NOTE — Progress Notes (Signed)
  Subjective:    Patient ID: [name], male    DOB: 1935-09-13, 77 y.o.   MRN: [id number]  HPI Patient seen for medical follow up He's had very challenging past few months as granddaughter died last summer of infectious illness. Overall, and he is coping fairly well. Not monitoring blood sugars regularly. Type 2 diabetes has been fairly well controlled for several years. He has hypertension which is well-controlled. Hyperlipidemia treated with simvastatin. He denies any chest pains or shortness of breath. He already had flu vaccine. He goes to podiatrist regularly regarding his feet  Past Medical History  Diagnosis Date  . HYPOTHYROIDISM 05/08/2008  . DM w/o complication type II 05/08/2008  . HYPERLIPIDEMIA 05/08/2008  . Unspecified essential hypertension 05/08/2008  . SUPERFICIAL PHLEBITIS 12/27/2008  . CELLULITIS, RIGHT LEG 04/22/2009  . ECZEMA 05/08/2008  . KNEE, ARTHRITIS, DEGEN./OSTEO 03/25/2009   Past Surgical History  Procedure Laterality Date  . Cholecystectomy    . Tonsillectomy  1946  . Nasal polyps    . Knee surgery      TKR Dr Devonne Doughty 2007    reports that he has never smoked. He does not have any smokeless tobacco history on file. He reports that he does not drink alcohol or use illicit drugs. family history includes Arthritis in his other; Heart disease in his other; Hypertension in his other. No Known Allergies     Review of Systems  Constitutional: Negative for fatigue.  Eyes: Negative for visual disturbance.  Respiratory: Negative for cough, chest tightness and shortness of breath.   Cardiovascular: Negative for chest pain, palpitations and leg swelling.  Neurological: Negative for dizziness, syncope, weakness, light-headedness and headaches.       Objective:   Physical Exam  Constitutional: He appears well-developed and well-nourished.  Neck: Neck supple.  Cardiovascular: Normal rate and regular rhythm.   Pulmonary/Chest: Effort normal and breath sounds  normal. No respiratory distress. He has no wheezes. He has no rales.  Musculoskeletal: He exhibits no edema.          Assessment & Plan:  Type 2 diabetes. History of fair control. Recheck A1c. Continue regular eye exams. Routine followup 6 months and get other labs that point including lipids

## 2013-02-20 ENCOUNTER — Telehealth: Payer: Self-pay | Admitting: Family Medicine

## 2013-02-20 NOTE — Telephone Encounter (Signed)
Go ahead and provide referral as requested by patient.

## 2013-02-20 NOTE — Telephone Encounter (Signed)
Pt needs referral to [facility].  Pt had wrist surgery and insurance requires referral for physical therapy. Pt is already schedule, just needs the referral.

## 2013-02-21 ENCOUNTER — Other Ambulatory Visit: Payer: Self-pay | Admitting: Family Medicine

## 2013-02-21 DIAGNOSIS — Z9889 Other specified postprocedural states: Secondary | ICD-10-CM

## 2013-02-21 NOTE — Telephone Encounter (Signed)
Order is placed.

## 2013-02-23 HISTORY — PX: WRIST SURGERY: SHX841

## 2013-03-01 ENCOUNTER — Telehealth: Payer: Self-pay | Admitting: Family Medicine

## 2013-03-01 NOTE — Telephone Encounter (Signed)
Pt request  Refill; levothyroxine (SYNTHROID, LEVOTHROID) 100 MCG tablet metFORMIN (GLUCOPHAGE) 500 MG tablet 2 in am / 1 in evening propranolol (INDERAL) 40 MG tablet simvastatin (ZOCOR) 40 MG tablet amLODipine (NORVASC) 10 MG tablet benazepril (LOTENSIN) 20 MG tablet hydrochlorothiazide (HYDRODIURIL) 25 MG tablet  NEW PHARM:  [facility] [place]  Id #  [id number]  Pt also needs test strips and lancets (accu check aviva plus) soft touch, would like to know if he can get through [facility] too?

## 2013-03-03 MED ORDER — ACCU-CHEK SOFT TOUCH LANCETS MISC: Status: DC

## 2013-03-03 MED ORDER — SIMVASTATIN 40 MG PO TABS: 40.0000 mg | ORAL_TABLET | Freq: Every day | ORAL | Status: DC

## 2013-03-03 MED ORDER — GLUCOSE BLOOD VI STRP: ORAL_STRIP | Status: DC

## 2013-03-03 MED ORDER — PROPRANOLOL HCL 40 MG PO TABS: 40.0000 mg | ORAL_TABLET | Freq: Every day | ORAL | Status: DC

## 2013-03-03 MED ORDER — HYDROCHLOROTHIAZIDE 25 MG PO TABS: 25.0000 mg | ORAL_TABLET | Freq: Every day | ORAL | Status: DC

## 2013-03-03 MED ORDER — BENAZEPRIL HCL 20 MG PO TABS: 20.0000 mg | ORAL_TABLET | Freq: Every day | ORAL | Status: DC

## 2013-03-03 MED ORDER — METFORMIN HCL 500 MG PO TABS: ORAL_TABLET | ORAL | Status: DC

## 2013-03-03 MED ORDER — LEVOTHYROXINE SODIUM 100 MCG PO TABS: 100.0000 ug | ORAL_TABLET | Freq: Every day | ORAL | Status: DC

## 2013-03-03 MED ORDER — AMLODIPINE BESYLATE 10 MG PO TABS: 10.0000 mg | ORAL_TABLET | Freq: Every day | ORAL | Status: DC

## 2013-03-03 NOTE — Telephone Encounter (Signed)
RX sent to [facility]  

## 2013-07-03 ENCOUNTER — Other Ambulatory Visit: Payer: Self-pay | Admitting: Family Medicine

## 2013-07-03 ENCOUNTER — Encounter: Payer: Self-pay | Admitting: Family Medicine

## 2013-07-03 ENCOUNTER — Telehealth: Payer: Self-pay | Admitting: Family Medicine

## 2013-07-03 ENCOUNTER — Ambulatory Visit (INDEPENDENT_AMBULATORY_CARE_PROVIDER_SITE_OTHER): Payer: Medicare HMO | Admitting: Family Medicine

## 2013-07-03 VITALS — BP 128/70 | HR 60 | Temp 97.8°F | Wt 201.0 lb

## 2013-07-03 DIAGNOSIS — E785 Hyperlipidemia, unspecified: Secondary | ICD-10-CM

## 2013-07-03 DIAGNOSIS — E119 Type 2 diabetes mellitus without complications: Secondary | ICD-10-CM

## 2013-07-03 DIAGNOSIS — E039 Hypothyroidism, unspecified: Secondary | ICD-10-CM

## 2013-07-03 DIAGNOSIS — I10 Essential (primary) hypertension: Secondary | ICD-10-CM

## 2013-07-03 LAB — BASIC METABOLIC PANEL
BUN: 20 mg/dL (ref 6–23)
CALCIUM: 10.1 mg/dL (ref 8.4–10.5)
CO2: 26 mEq/L (ref 19–32)
CREATININE: 1.1 mg/dL (ref 0.4–1.5)
Chloride: 99 mEq/L (ref 96–112)
GFR: 71.41 mL/min (ref >60.00)
Glucose, Bld: 111 mg/dL — ABNORMAL HIGH (ref 70–99)
Potassium: 3.9 mEq/L (ref 3.5–5.1)
SODIUM: 137 meq/L (ref 135–145)

## 2013-07-03 LAB — HEPATIC FUNCTION PANEL
ALBUMIN: 4.3 g/dL (ref 3.5–5.2)
ALK PHOS: 33 U/L — AB (ref 39–117)
ALT: 22 U/L (ref 0–53)
AST: 27 U/L (ref 0–37)
BILIRUBIN DIRECT: 0.1 mg/dL (ref 0.0–0.3)
BILIRUBIN TOTAL: 1.7 mg/dL — AB (ref 0.2–1.2)
Total Protein: 7.7 g/dL (ref 6.0–8.3)

## 2013-07-03 LAB — LIPID PANEL
CHOL/HDL RATIO: 4
Cholesterol: 128 mg/dL (ref 0–200)
HDL: 33.2 mg/dL — AB (ref >39.00)
LDL Cholesterol: 63 mg/dL (ref 0–99)
Triglycerides: 159 mg/dL — ABNORMAL HIGH (ref 0.0–149.0)
VLDL: 31.8 mg/dL (ref 0.0–40.0)

## 2013-07-03 LAB — TSH: TSH: 2.33 u[IU]/mL (ref 0.35–4.50)

## 2013-07-03 LAB — HEMOGLOBIN A1C: Hgb A1c MFr Bld: 7.5 % — ABNORMAL HIGH (ref 4.6–6.5)

## 2013-07-03 NOTE — Progress Notes (Signed)
Pre visit review using our clinic review tool, if applicable. No additional management support is needed unless otherwise documented below in the visit note. 

## 2013-07-03 NOTE — Progress Notes (Signed)
   Subjective:    Patient ID: [name], male    DOB: 28-Mar-1935, 78 y.o.   MRN: [id number]  HPI Patient here for routine medical followup. He has history of type 2 diabetes, hypertension, hyperlipidemia, hypothyroidism. He's had osteoarthritis on multiple joints. He had hand surgery on the right hand several months ago that went well. He plans to see podiatrist next week. Blood sugars been stable. He is complaining of some generalized fatigue issues. Not active over the winter. Still having difficulties coping with loss of his granddaughter several months ago. He does have fewer crying spells. He is sleeping well. Appetite and weight are stable. Loss of interest in activities.  Past Medical History  Diagnosis Date  . HYPOTHYROIDISM 05/08/2008  . DM w/o complication type II 04/04/4707  . HYPERLIPIDEMIA 05/08/2008  . Unspecified essential hypertension 05/08/2008  . SUPERFICIAL PHLEBITIS 12/27/2008  . CELLULITIS, RIGHT LEG 04/22/2009  . ECZEMA 05/08/2008  . KNEE, ARTHRITIS, DEGEN./OSTEO 03/25/2009   Past Surgical History  Procedure Laterality Date  . Cholecystectomy    . Tonsillectomy  1946  . Nasal polyps    . Knee surgery      TKR Dr Alma Friendly 2007    reports that he has never smoked. He does not have any smokeless tobacco history on file. He reports that he does not drink alcohol or use illicit drugs. family history includes Arthritis in his other; Heart disease in his other; Hypertension in his other. No Known Allergies    Review of Systems  Constitutional: Positive for fatigue.  Eyes: Negative for visual disturbance.  Respiratory: Negative for cough, chest tightness and shortness of breath.   Cardiovascular: Negative for chest pain, palpitations and leg swelling.  Endocrine: Negative for polydipsia and polyuria.  Neurological: Negative for dizziness, syncope, weakness, light-headedness and headaches.       Objective:   Physical Exam  Constitutional: He appears well-developed  and well-nourished.  Cardiovascular: Normal rate and regular rhythm.   Pulmonary/Chest: Effort normal and breath sounds normal. No respiratory distress. He has no wheezes. He has no rales.  Musculoskeletal: He exhibits no edema.          Assessment & Plan:  #1 type 2 diabetes. History of good control. Recheck A1c. Continue yearly eye exams #2 hypertension which is stable and at goal #3 dyslipidemia. Check lipid and hepatic panel #4 hypothyroidism. Recheck TSH #5 adjustment disorder with depressed mood. PHQ- 9 scores 10. He's not interested in medication options at this time. He had previous counseling. He is encouraged to get back to regular hobbies and usual activities as much as possible

## 2013-07-03 NOTE — Telephone Encounter (Signed)
Relevant patient education assigned to patient using Emmi. ° °

## 2013-08-03 ENCOUNTER — Telehealth: Payer: Self-pay

## 2013-08-03 NOTE — Telephone Encounter (Signed)
Relevant patient education assigned to patient using Emmi. ° °

## 2013-10-26 ENCOUNTER — Telehealth: Payer: Self-pay | Admitting: Family Medicine

## 2013-10-26 DIAGNOSIS — E119 Type 2 diabetes mellitus without complications: Secondary | ICD-10-CM

## 2013-10-26 NOTE — Telephone Encounter (Signed)
Pt called to ask for a referral to a podiatrist for feet check and toe nail trims   [name] [phone]

## 2013-10-26 NOTE — Telephone Encounter (Signed)
I was thinking he has seen Dr Steffanie Rainwater in past.  Ok to refer to him.

## 2013-10-27 NOTE — Telephone Encounter (Signed)
Referral is ordered

## 2013-11-29 ENCOUNTER — Ambulatory Visit (INDEPENDENT_AMBULATORY_CARE_PROVIDER_SITE_OTHER): Payer: Medicare HMO

## 2013-11-29 DIAGNOSIS — Z23 Encounter for immunization: Secondary | ICD-10-CM

## 2013-12-07 ENCOUNTER — Telehealth: Payer: Self-pay | Admitting: Family Medicine

## 2013-12-07 NOTE — Telephone Encounter (Signed)
Pt call to ask for a referral to [facility] . Pt had a hearing test and was told he needed to see a Otolaryngology

## 2013-12-08 ENCOUNTER — Other Ambulatory Visit: Payer: Self-pay | Admitting: Family Medicine

## 2013-12-08 DIAGNOSIS — H9190 Unspecified hearing loss, unspecified ear: Secondary | ICD-10-CM

## 2013-12-08 NOTE — Telephone Encounter (Signed)
OK to set up 

## 2013-12-11 NOTE — Telephone Encounter (Signed)
Referral done

## 2014-01-03 ENCOUNTER — Ambulatory Visit (INDEPENDENT_AMBULATORY_CARE_PROVIDER_SITE_OTHER): Payer: Medicare HMO | Admitting: Family Medicine

## 2014-01-03 ENCOUNTER — Encounter: Payer: Self-pay | Admitting: Family Medicine

## 2014-01-03 VITALS — BP 130/80 | HR 62 | Temp 98.3°F | Wt 197.0 lb

## 2014-01-03 DIAGNOSIS — I10 Essential (primary) hypertension: Secondary | ICD-10-CM

## 2014-01-03 DIAGNOSIS — E785 Hyperlipidemia, unspecified: Secondary | ICD-10-CM

## 2014-01-03 DIAGNOSIS — E119 Type 2 diabetes mellitus without complications: Secondary | ICD-10-CM

## 2014-01-03 LAB — HEMOGLOBIN A1C: HEMOGLOBIN A1C: 7.2 % — AB (ref 4.6–6.5)

## 2014-01-03 MED ORDER — SIMVASTATIN 40 MG PO TABS: 40.0000 mg | ORAL_TABLET | Freq: Every day | ORAL | Status: DC

## 2014-01-03 MED ORDER — PROPRANOLOL HCL 40 MG PO TABS: 40.0000 mg | ORAL_TABLET | Freq: Every day | ORAL | Status: DC

## 2014-01-03 MED ORDER — BENAZEPRIL HCL 20 MG PO TABS: 20.0000 mg | ORAL_TABLET | Freq: Every day | ORAL | Status: DC

## 2014-01-03 MED ORDER — LEVOTHYROXINE SODIUM 100 MCG PO TABS: 100.0000 ug | ORAL_TABLET | Freq: Every day | ORAL | Status: DC

## 2014-01-03 MED ORDER — HYDROCHLOROTHIAZIDE 25 MG PO TABS: 25.0000 mg | ORAL_TABLET | Freq: Every day | ORAL | Status: DC

## 2014-01-03 MED ORDER — METFORMIN HCL 500 MG PO TABS: ORAL_TABLET | ORAL | Status: DC

## 2014-01-03 MED ORDER — AMLODIPINE BESYLATE 10 MG PO TABS: 10.0000 mg | ORAL_TABLET | Freq: Every day | ORAL | Status: DC

## 2014-01-03 NOTE — Addendum Note (Signed)
Addended by: Marcina Millard on: 01/03/2014 09:47 AM   Modules accepted: Orders

## 2014-01-03 NOTE — Progress Notes (Signed)
Pre visit review using our clinic review tool, if applicable. No additional management support is needed unless otherwise documented below in the visit note. 

## 2014-01-03 NOTE — Progress Notes (Signed)
   Subjective:    Patient ID: [name], male    DOB: 08-11-1935, 78 y.o.   MRN: [id number]  HPI Patient seen for routine medical follow-up. His chronic problems including history of type 2 diabetes, hyperlipidemia, hypothyroidism, hypertension. Medications reviewed. His A1c has been fairly consistent. He gets regular foot care with male turning per podiatrist every few months. He needs refills of all medications at this time. He denies any recent chest pains. No dyspnea. He gets yearly eye exams. Already had flu vaccine.  Past Medical History  Diagnosis Date  . HYPOTHYROIDISM 05/08/2008  . DM w/o complication type II 8/32/9191  . HYPERLIPIDEMIA 05/08/2008  . Unspecified essential hypertension 05/08/2008  . SUPERFICIAL PHLEBITIS 12/27/2008  . CELLULITIS, RIGHT LEG 04/22/2009  . ECZEMA 05/08/2008  . KNEE, ARTHRITIS, DEGEN./OSTEO 03/25/2009   Past Surgical History  Procedure Laterality Date  . Cholecystectomy    . Tonsillectomy  1946  . Nasal polyps    . Knee surgery      TKR Dr Alma Friendly 2007    reports that he has never smoked. He does not have any smokeless tobacco history on file. He reports that he does not drink alcohol or use illicit drugs. family history includes Arthritis in his other; Heart disease in his other; Hypertension in his other. No Known Allergies    Review of Systems  Constitutional: Negative for fatigue.  Eyes: Negative for visual disturbance.  Respiratory: Negative for cough, chest tightness and shortness of breath.   Cardiovascular: Negative for chest pain, palpitations and leg swelling.  Endocrine: Negative for polydipsia and polyuria.  Neurological: Negative for dizziness, syncope, weakness, light-headedness and headaches.       Objective:   Physical Exam  Constitutional: He is oriented to person, place, and time. He appears well-developed and well-nourished. No distress.  Neck: Normal range of motion. Neck supple. No thyromegaly present.    Cardiovascular: Normal rate, regular rhythm and normal heart sounds.   No murmur heard. Pulmonary/Chest: No respiratory distress. He has no wheezes. He has no rales.  Musculoskeletal: He exhibits no edema.  Lymphadenopathy:    He has no cervical adenopathy.  Neurological: He is alert and oriented to person, place, and time. No cranial nerve deficit.  Psychiatric: He has a normal mood and affect.          Assessment & Plan:  #1 type 2 diabetes. History of good control. Recheck A1c. #2 hypertension which is stable and at goal. Continue current medications with refills given #3 dyslipidemia. Will plan to recheck lipids in the spring. Refill medication for one year #4 hypothyroidism. TSH at goal last May. Recheck in 6 months

## 2014-02-06 LAB — HM DIABETES EYE EXAM

## 2014-02-15 ENCOUNTER — Encounter: Payer: Self-pay | Admitting: Family Medicine

## 2014-03-19 ENCOUNTER — Other Ambulatory Visit: Payer: Self-pay | Admitting: Family Medicine

## 2014-04-17 DIAGNOSIS — E1142 Type 2 diabetes mellitus with diabetic polyneuropathy: Secondary | ICD-10-CM | POA: Diagnosis not present

## 2014-04-17 DIAGNOSIS — L84 Corns and callosities: Secondary | ICD-10-CM | POA: Diagnosis not present

## 2014-04-17 DIAGNOSIS — B351 Tinea unguium: Secondary | ICD-10-CM | POA: Diagnosis not present

## 2014-06-26 DIAGNOSIS — B351 Tinea unguium: Secondary | ICD-10-CM | POA: Diagnosis not present

## 2014-06-26 DIAGNOSIS — E1142 Type 2 diabetes mellitus with diabetic polyneuropathy: Secondary | ICD-10-CM | POA: Diagnosis not present

## 2014-06-26 DIAGNOSIS — L84 Corns and callosities: Secondary | ICD-10-CM | POA: Diagnosis not present

## 2014-07-04 ENCOUNTER — Encounter: Payer: Self-pay | Admitting: Family Medicine

## 2014-07-04 ENCOUNTER — Ambulatory Visit (INDEPENDENT_AMBULATORY_CARE_PROVIDER_SITE_OTHER): Payer: Commercial Managed Care - HMO | Admitting: Family Medicine

## 2014-07-04 VITALS — BP 128/70 | HR 70 | Temp 98.3°F | Wt 200.0 lb

## 2014-07-04 DIAGNOSIS — Z23 Encounter for immunization: Secondary | ICD-10-CM | POA: Diagnosis not present

## 2014-07-04 DIAGNOSIS — E038 Other specified hypothyroidism: Secondary | ICD-10-CM

## 2014-07-04 DIAGNOSIS — E785 Hyperlipidemia, unspecified: Secondary | ICD-10-CM | POA: Diagnosis not present

## 2014-07-04 DIAGNOSIS — E119 Type 2 diabetes mellitus without complications: Secondary | ICD-10-CM | POA: Diagnosis not present

## 2014-07-04 DIAGNOSIS — I10 Essential (primary) hypertension: Secondary | ICD-10-CM

## 2014-07-04 LAB — HEPATIC FUNCTION PANEL
ALT: 27 U/L (ref 0–53)
AST: 28 U/L (ref 0–37)
Albumin: 4.2 g/dL (ref 3.5–5.2)
Alkaline Phosphatase: 39 U/L (ref 39–117)
BILIRUBIN DIRECT: 0.3 mg/dL (ref 0.0–0.3)
BILIRUBIN TOTAL: 1.8 mg/dL — AB (ref 0.2–1.2)
Total Protein: 7.5 g/dL (ref 6.0–8.3)

## 2014-07-04 LAB — HEMOGLOBIN A1C: HEMOGLOBIN A1C: 7.5 % — AB (ref 4.6–6.5)

## 2014-07-04 LAB — LIPID PANEL
CHOL/HDL RATIO: 4
Cholesterol: 132 mg/dL (ref 0–200)
HDL: 32.5 mg/dL — AB (ref >39.00)
LDL Cholesterol: 71 mg/dL (ref 0–99)
NONHDL: 99.5
Triglycerides: 145 mg/dL (ref 0.0–149.0)
VLDL: 29 mg/dL (ref 0.0–40.0)

## 2014-07-04 LAB — BASIC METABOLIC PANEL
BUN: 18 mg/dL (ref 6–23)
CHLORIDE: 95 meq/L — AB (ref 96–112)
CO2: 28 meq/L (ref 19–32)
Calcium: 10 mg/dL (ref 8.4–10.5)
Creatinine, Ser: 1.16 mg/dL (ref 0.40–1.50)
GFR: 64.88 mL/min (ref >60.00)
Glucose, Bld: 145 mg/dL — ABNORMAL HIGH (ref 70–99)
Potassium: 3.7 mEq/L (ref 3.5–5.1)
Sodium: 131 mEq/L — ABNORMAL LOW (ref 135–145)

## 2014-07-04 LAB — HM DIABETES FOOT EXAM: HM DIABETIC FOOT EXAM: NORMAL

## 2014-07-04 LAB — TSH: TSH: 5.37 u[IU]/mL — AB (ref 0.35–4.50)

## 2014-07-04 NOTE — Patient Instructions (Signed)
Consider repeat colonoscopy at some point later this year.

## 2014-07-04 NOTE — Progress Notes (Signed)
   Subjective:    Patient ID: [name], male    DOB: 11/23/1935, 79 y.o.   MRN: [id number]  HPI Medical follow-up  Type 2 diabetes. History of good control. No neuropathy. He sees a podiatrist regularly. He remains on metformin. Blood sugars been relatively stable for several years.  Hyperlipidemia. Treated with simvastatin. No myalgias. No cardiac or peripheral vascular disease history. He has hypertension treated with amlodipine, Lotensin, and HCTZ. Also takes propranolol which is been on for several years. Blood pressure stable. No dizziness.  Hypothyroidism on levothyroxine. Compliant with therapy. Due for labs.  No history of Prevnar 13. He gets regular eye exams. Last colonoscopy 10 years ago.  Past Medical History  Diagnosis Date  . HYPOTHYROIDISM 05/08/2008  . DM w/o complication type II 1/51/7616  . HYPERLIPIDEMIA 05/08/2008  . Unspecified essential hypertension 05/08/2008  . SUPERFICIAL PHLEBITIS 12/27/2008  . CELLULITIS, RIGHT LEG 04/22/2009  . ECZEMA 05/08/2008  . KNEE, ARTHRITIS, DEGEN./OSTEO 03/25/2009   Past Surgical History  Procedure Laterality Date  . Cholecystectomy    . Tonsillectomy  1946  . Nasal polyps    . Knee surgery      TKR Dr Alma Friendly 2007    reports that he has never smoked. He does not have any smokeless tobacco history on file. He reports that he does not drink alcohol or use illicit drugs. family history includes Arthritis in his other; Heart disease in his other; Hypertension in his other. No Known Allergies    Review of Systems  Constitutional: Negative for fatigue and unexpected weight change.  Eyes: Negative for visual disturbance.  Respiratory: Negative for cough, chest tightness and shortness of breath.   Cardiovascular: Negative for chest pain, palpitations and leg swelling.  Endocrine: Negative for polydipsia and polyuria.  Neurological: Negative for dizziness, syncope, weakness, light-headedness and headaches.       Objective:     Physical Exam  Constitutional: He is oriented to person, place, and time. He appears well-developed and well-nourished.  HENT:  Mouth/Throat: Oropharynx is clear and moist.  Neck: Neck supple. No thyromegaly present.  Cardiovascular: Normal rate and regular rhythm.   Pulmonary/Chest: Effort normal and breath sounds normal. No respiratory distress. He has no wheezes. He has no rales.  Musculoskeletal: He exhibits no edema.  Neurological: He is alert and oriented to person, place, and time.          Assessment & Plan:  #1 type 2 diabetes. Repeat A1c. Continue yearly eye exam #2 hypertension controlled and at goal. Continue current medications #3 hyperlipidemia. Repeat lipid and hepatic panel #4 hypothyroidism. Recheck TSH #5 health maintenance. Prevnar 13 given. Continue yearly flu vaccine. Discussed repeat colonoscopy and he is undecided at this point. He will check on insurance coverage first.

## 2014-07-04 NOTE — Progress Notes (Signed)
Pre visit review using our clinic review tool, if applicable. No additional management support is needed unless otherwise documented below in the visit note. 

## 2014-07-04 NOTE — Addendum Note (Signed)
Addended by: Marcina Millard on: 07/04/2014 08:57 AM   Modules accepted: Orders

## 2014-07-25 ENCOUNTER — Other Ambulatory Visit: Payer: Self-pay | Admitting: Family Medicine

## 2014-07-25 ENCOUNTER — Telehealth: Payer: Self-pay | Admitting: Family Medicine

## 2014-07-25 DIAGNOSIS — Z1211 Encounter for screening for malignant neoplasm of colon: Secondary | ICD-10-CM

## 2014-07-25 NOTE — Telephone Encounter (Signed)
Pt aware that referral is ordered. Pt stated his last colonoscopy was at [facility] .

## 2014-07-25 NOTE — Telephone Encounter (Signed)
OK to refer.  We will need to see who he has seen previously.

## 2014-07-25 NOTE — Telephone Encounter (Signed)
Patient's wife called stating patient needs a referral for a colonoscopy.

## 2014-07-26 ENCOUNTER — Encounter: Payer: Self-pay | Admitting: Gastroenterology

## 2014-09-04 DIAGNOSIS — E1142 Type 2 diabetes mellitus with diabetic polyneuropathy: Secondary | ICD-10-CM | POA: Diagnosis not present

## 2014-09-04 DIAGNOSIS — L84 Corns and callosities: Secondary | ICD-10-CM | POA: Diagnosis not present

## 2014-09-04 DIAGNOSIS — B351 Tinea unguium: Secondary | ICD-10-CM | POA: Diagnosis not present

## 2014-09-17 ENCOUNTER — Ambulatory Visit (AMBULATORY_SURGERY_CENTER): Payer: Self-pay

## 2014-09-17 VITALS — Ht 71.0 in | Wt 201.0 lb

## 2014-09-17 DIAGNOSIS — Z1211 Encounter for screening for malignant neoplasm of colon: Secondary | ICD-10-CM

## 2014-09-17 MED ORDER — NA SULFATE-K SULFATE-MG SULF 17.5-3.13-1.6 GM/177ML PO SOLN: 1.0000 | Freq: Once | ORAL | Status: DC

## 2014-09-17 NOTE — Progress Notes (Signed)
Not on home 02 No egg or soy allergies No hx of anesthesia complications

## 2014-10-01 ENCOUNTER — Encounter: Payer: Self-pay | Admitting: Gastroenterology

## 2014-10-01 ENCOUNTER — Ambulatory Visit (AMBULATORY_SURGERY_CENTER): Payer: Commercial Managed Care - HMO | Admitting: Gastroenterology

## 2014-10-01 VITALS — BP 118/71 | HR 57 | Temp 96.1°F | Resp 19 | Ht 71.0 in | Wt 201.0 lb

## 2014-10-01 DIAGNOSIS — I10 Essential (primary) hypertension: Secondary | ICD-10-CM | POA: Diagnosis not present

## 2014-10-01 DIAGNOSIS — E039 Hypothyroidism, unspecified: Secondary | ICD-10-CM | POA: Diagnosis not present

## 2014-10-01 DIAGNOSIS — D122 Benign neoplasm of ascending colon: Secondary | ICD-10-CM | POA: Diagnosis not present

## 2014-10-01 DIAGNOSIS — K5730 Diverticulosis of large intestine without perforation or abscess without bleeding: Secondary | ICD-10-CM

## 2014-10-01 DIAGNOSIS — Z1211 Encounter for screening for malignant neoplasm of colon: Secondary | ICD-10-CM | POA: Diagnosis present

## 2014-10-01 DIAGNOSIS — E119 Type 2 diabetes mellitus without complications: Secondary | ICD-10-CM | POA: Diagnosis not present

## 2014-10-01 MED ORDER — FLEET ENEMA 7-19 GM/118ML RE ENEM
1.0000 | ENEMA | Freq: Once | RECTAL | Status: AC
  Administered 2014-10-01: 1 via RECTAL

## 2014-10-01 MED ORDER — SODIUM CHLORIDE 0.9 % IV SOLN: 500.0000 mL | INTRAVENOUS | Status: DC

## 2014-10-01 NOTE — Op Note (Signed)
[facility]  [address] [place], [id number]   COLONOSCOPY PROCEDURE REPORT  PATIENT: [name], [name] [name]  MR#: [id number] BIRTHDATE: 26-Feb-1935 , 79  yrs. old GENDER: male ENDOSCOPIST: Inda Castle, MD REFERRED JS:HFWYO Burchette, M.D. PROCEDURE DATE:  10/01/2014 PROCEDURE:   Colonoscopy, screening and Colonoscopy with snare polypectomy First Screening Colonoscopy - Avg.  risk and is 50 yrs.  old or older - No.  Prior Negative Screening - Now for repeat screening. 10 or more years since last screening  History of Adenoma - Now for follow-up colonoscopy & has been > or = to 3 yrs.  N/A  Polyps removed today? Yes ASA CLASS:   Class II INDICATIONS:Colorectal Neoplasm Risk Assessment for this procedure is average risk. MEDICATIONS: Monitored anesthesia care and Propofol 180 mg IV  DESCRIPTION OF PROCEDURE:   After the risks benefits and alternatives of the procedure were thoroughly explained, informed consent was obtained.  The digital rectal exam revealed no abnormalities of the rectum.   The [id number]  endoscope was introduced through the anus and advanced to the cecum, which was identified by both the appendix and ileocecal valve. No adverse events experienced.   The quality of the prep was (Suprep was used) good.  The instrument was then slowly withdrawn as the colon was fully examined. Estimated blood loss is zero unless otherwise noted in this procedure report.      COLON FINDINGS: There was moderate diverticulosis noted in the descending colon and sigmoid colon with associated muscular hypertrophy.   A sessile polyp measuring 4 mm in size was found in the ascending colon.  A polypectomy was performed with a cold snare.  The resection was complete, the polyp tissue was completely retrieved and sent to histology.  Retroflexed views revealed no abnormalities. The time to cecum = 5.3 Withdrawal time = 6.2   The scope was withdrawn and the  procedure completed. COMPLICATIONS: There were no immediate complications.  ENDOSCOPIC IMPRESSION: 1.   There was moderate diverticulosis noted in the descending colon and sigmoid colon 2.   Sessile polyp was found in the ascending colon; polypectomy was performed with a cold snare  RECOMMENDATIONS: If the polyp(s) removed today are proven to be adenomatous (pre-cancerous) polyps, you will need a repeat colonoscopy in 5 years.  Otherwise you should continue to follow colorectal cancer screening guidelines for "routine risk" patients with colonoscopy in 10 years.  You will receive a letter within 1-2 weeks with the results of your biopsy as well as final recommendations.  Please call my office if you have not received a letter after 3 weeks.  eSigned:  Inda Castle, MD 10/01/2014 10:05 AM   cc:   PATIENT NAME:  [name], [name] [name] MR#: [id number]

## 2014-10-01 NOTE — Progress Notes (Signed)
Called to room to assist during endoscopic procedure.  Patient ID and intended procedure confirmed with present staff. Received instructions for my participation in the procedure from the performing physician.  

## 2014-10-01 NOTE — Patient Instructions (Signed)
YOU HAD AN ENDOSCOPIC PROCEDURE TODAY AT [facility]:   Refer to the procedure report that was given to you for any specific questions about what was found during the examination.  If the procedure report does not answer your questions, please call your gastroenterologist to clarify.  If you requested that your care partner not be given the details of your procedure findings, then the procedure report has been included in a sealed envelope for you to review at your convenience later.  YOU SHOULD EXPECT: Some feelings of bloating in the abdomen. Passage of more gas than usual.  Walking can help get rid of the air that was put into your GI tract during the procedure and reduce the bloating. If you had a lower endoscopy (such as a colonoscopy or flexible sigmoidoscopy) you may notice spotting of blood in your stool or on the toilet paper. If you underwent a bowel prep for your procedure, you may not have a normal bowel movement for a few days.  Please Note:  You might notice some irritation and congestion in your nose or some drainage.  This is from the oxygen used during your procedure.  There is no need for concern and it should clear up in a day or so.  SYMPTOMS TO REPORT IMMEDIATELY:   Following lower endoscopy (colonoscopy or flexible sigmoidoscopy):  Excessive amounts of blood in the stool  Significant tenderness or worsening of abdominal pains  Swelling of the abdomen that is new, acute  Fever of 100F or higher   For urgent or emergent issues, a gastroenterologist can be reached at any hour by calling [phone].   DIET: Your first meal following the procedure should be a small meal and then it is ok to progress to your normal diet. Heavy or fried foods are harder to digest and may make you feel nauseous or bloated.  Likewise, meals heavy in dairy and vegetables can increase bloating.  Drink plenty of fluids but you should avoid alcoholic beverages for 24  hours.  ACTIVITY:  You should plan to take it easy for the rest of today and you should NOT DRIVE or use heavy machinery until tomorrow (because of the sedation medicines used during the test).    FOLLOW UP: Our staff will call the number listed on your records the next business day following your procedure to check on you and address any questions or concerns that you may have regarding the information given to you following your procedure. If we do not reach you, we will leave a message.  However, if you are feeling well and you are not experiencing any problems, there is no need to return our call.  We will assume that you have returned to your regular daily activities without incident.  If any biopsies were taken you will be contacted by phone or by letter within the next 1-3 weeks.  Please call us at [phone] if you have not heard about the biopsies in 3 weeks.    SIGNATURES/CONFIDENTIALITY: You and/or your care partner have signed paperwork which will be entered into your electronic medical record.  These signatures attest to the fact that that the information above on your After Visit Summary has been reviewed and is understood.  Full responsibility of the confidentiality of this discharge information lies with you and/or your care-partner.   Resume medications. Information given on polyps and diverticulosis.

## 2014-10-01 NOTE — Progress Notes (Signed)
To recovery, report to Brown, RN, VSS. 

## 2014-10-02 ENCOUNTER — Telehealth: Payer: Self-pay | Admitting: *Deleted

## 2014-10-02 NOTE — Telephone Encounter (Signed)
  Follow up Call-  Call back number 10/01/2014  Post procedure Call Back phone  # [phone]  Permission to leave phone message Yes     Patient questions:  Do you have a fever, pain , or abdominal swelling? No. Pain Score  0 *  Have you tolerated food without any problems? Yes.    Have you been able to return to your normal activities? Yes.    Do you have any questions about your discharge instructions: Diet   No. Medications  No. Follow up visit  No.  Do you have questions or concerns about your Care? No.  Actions: * If pain score is 4 or above: No action needed, pain <4.

## 2014-10-03 ENCOUNTER — Other Ambulatory Visit: Payer: Self-pay | Admitting: Family Medicine

## 2014-10-05 ENCOUNTER — Encounter: Payer: Self-pay | Admitting: Gastroenterology

## 2014-11-13 DIAGNOSIS — L84 Corns and callosities: Secondary | ICD-10-CM | POA: Diagnosis not present

## 2014-11-13 DIAGNOSIS — E1142 Type 2 diabetes mellitus with diabetic polyneuropathy: Secondary | ICD-10-CM | POA: Diagnosis not present

## 2014-11-13 DIAGNOSIS — B351 Tinea unguium: Secondary | ICD-10-CM | POA: Diagnosis not present

## 2015-01-07 ENCOUNTER — Encounter: Payer: Self-pay | Admitting: Family Medicine

## 2015-01-07 ENCOUNTER — Ambulatory Visit (INDEPENDENT_AMBULATORY_CARE_PROVIDER_SITE_OTHER): Payer: Commercial Managed Care - HMO | Admitting: Family Medicine

## 2015-01-07 DIAGNOSIS — E039 Hypothyroidism, unspecified: Secondary | ICD-10-CM

## 2015-01-07 DIAGNOSIS — E538 Deficiency of other specified B group vitamins: Secondary | ICD-10-CM

## 2015-01-07 DIAGNOSIS — E119 Type 2 diabetes mellitus without complications: Secondary | ICD-10-CM | POA: Diagnosis not present

## 2015-01-07 DIAGNOSIS — I10 Essential (primary) hypertension: Secondary | ICD-10-CM | POA: Diagnosis not present

## 2015-01-07 DIAGNOSIS — R5383 Other fatigue: Secondary | ICD-10-CM

## 2015-01-07 DIAGNOSIS — E785 Hyperlipidemia, unspecified: Secondary | ICD-10-CM

## 2015-01-07 LAB — BASIC METABOLIC PANEL
BUN: 23 mg/dL (ref 6–23)
CALCIUM: 10.8 mg/dL — AB (ref 8.4–10.5)
CHLORIDE: 97 meq/L (ref 96–112)
CO2: 31 meq/L (ref 19–32)
Creatinine, Ser: 1.31 mg/dL (ref 0.40–1.50)
GFR: 56.31 mL/min — ABNORMAL LOW (ref >60.00)
GLUCOSE: 164 mg/dL — AB (ref 70–99)
Potassium: 5 mEq/L (ref 3.5–5.1)
SODIUM: 138 meq/L (ref 135–145)

## 2015-01-07 LAB — HEMOGLOBIN A1C: HEMOGLOBIN A1C: 7.3 % — AB (ref 4.6–6.5)

## 2015-01-07 LAB — TSH: TSH: 4.71 u[IU]/mL — ABNORMAL HIGH (ref 0.35–4.50)

## 2015-01-07 LAB — VITAMIN B12: Vitamin B-12: 55 pg/mL — ABNORMAL LOW (ref 211–911)

## 2015-01-07 NOTE — Progress Notes (Signed)
Pre visit review using our clinic review tool, if applicable. No additional management support is needed unless otherwise documented below in the visit note. 

## 2015-01-07 NOTE — Progress Notes (Signed)
   Subjective:    Patient ID: [name], male    DOB: [birth year], 79 y.o.   MRN: [id number]  HPI Follow-up multiple items  Type 2 diabetes. History of fair control. Last A1c 7.5%. Fasting blood sugars usually run 130. For many years has been on metformin. Hypertension treated with several drug regimen. Compliant with all. He did not take his medications though this morning. No recent chest pains. No headaches. No dizziness.  Hypothyroidism. Recent increased fatigue. Also has cold intolerance. Recent TSH over 5. Compliant with therapy. Wife has concerns that he may have some mild memory impairment. Still driving.  Past Medical History  Diagnosis Date  . HYPOTHYROIDISM 05/08/2008  . DM w/o complication type II [id number]  . HYPERLIPIDEMIA 05/08/2008  . Unspecified essential hypertension 05/08/2008  . SUPERFICIAL PHLEBITIS 12/27/2008  . CELLULITIS, RIGHT LEG 04/22/2009  . ECZEMA 05/08/2008  . KNEE, ARTHRITIS, DEGEN./OSTEO 03/25/2009   Past Surgical History  Procedure Laterality Date  . Cholecystectomy    . Tonsillectomy  1946  . Nasal polyps    . Knee surgery      TKR Dr Alma Friendly 2007  . Cataract extraction  2005    bilateral  . Wrist surgery  2015    right side  . Colonoscopy  04-15-2004    tics    reports that he has never smoked. He does not have any smokeless tobacco history on file. He reports that he does not drink alcohol or use illicit drugs. family history includes Arthritis in his other; Heart disease in his other; Hypertension in his other. There is no history of Colon cancer. No Known Allergies    Review of Systems  Constitutional: Positive for fatigue. Negative for fever, chills, appetite change and unexpected weight change.  Eyes: Negative for visual disturbance.  Respiratory: Negative for cough, chest tightness and shortness of breath.   Cardiovascular: Negative for chest pain, palpitations and leg swelling.  Gastrointestinal: Negative for abdominal pain.    Endocrine: Positive for cold intolerance. Negative for polydipsia and polyuria.  Genitourinary: Negative for dysuria.  Neurological: Negative for dizziness, syncope, weakness, light-headedness and headaches.       Objective:   Physical Exam  Constitutional: He is oriented to person, place, and time. He appears well-developed and well-nourished.  Neck: Neck supple. No JVD present. No thyromegaly present.  Cardiovascular: Normal rate and regular rhythm.   Pulmonary/Chest: Effort normal and breath sounds normal. No respiratory distress. He has no wheezes. He has no rales.  Musculoskeletal:  Trace edema legs bilaterally  Neurological: He is alert and oriented to person, place, and time. No cranial nerve deficit.  Psychiatric:  Oriented to time place person. 3 word recall normal          Assessment & Plan:  #1 type 2 diabetes. History of fair control. Recheck A1c #2 hypertension. Borderline elevation today. Patient does not take medications this morning which may account #3 hypothyroidism. Patient complaining of fatigue and cold intolerance. Recheck TSH. If still elevated titrate levothyroxin #4 hyperlipidemia. Lipids are checked 6 months ago and stable. Continue simvastatin.

## 2015-01-09 MED ORDER — LEVOTHYROXINE SODIUM 112 MCG PO TABS: 112.0000 ug | ORAL_TABLET | Freq: Every day | ORAL | Status: DC

## 2015-01-09 NOTE — Addendum Note (Signed)
Addended by: Elio Forget on: 01/09/2015 12:53 PM   Modules accepted: Orders

## 2015-01-10 ENCOUNTER — Other Ambulatory Visit: Payer: Self-pay | Admitting: Family Medicine

## 2015-01-11 ENCOUNTER — Ambulatory Visit (INDEPENDENT_AMBULATORY_CARE_PROVIDER_SITE_OTHER): Payer: Commercial Managed Care - HMO | Admitting: Family Medicine

## 2015-01-11 DIAGNOSIS — E538 Deficiency of other specified B group vitamins: Secondary | ICD-10-CM | POA: Diagnosis not present

## 2015-01-11 MED ORDER — CYANOCOBALAMIN 1000 MCG/ML IJ SOLN
1000.0000 ug | Freq: Once | INTRAMUSCULAR | Status: AC
  Administered 2015-01-11: 1000 ug via INTRAMUSCULAR

## 2015-01-21 ENCOUNTER — Ambulatory Visit: Payer: Commercial Managed Care - HMO | Admitting: Family Medicine

## 2015-01-22 ENCOUNTER — Ambulatory Visit (INDEPENDENT_AMBULATORY_CARE_PROVIDER_SITE_OTHER): Payer: Commercial Managed Care - HMO | Admitting: Family Medicine

## 2015-01-22 DIAGNOSIS — E538 Deficiency of other specified B group vitamins: Secondary | ICD-10-CM | POA: Diagnosis not present

## 2015-01-22 DIAGNOSIS — B351 Tinea unguium: Secondary | ICD-10-CM | POA: Diagnosis not present

## 2015-01-22 DIAGNOSIS — L84 Corns and callosities: Secondary | ICD-10-CM | POA: Diagnosis not present

## 2015-01-22 DIAGNOSIS — E1142 Type 2 diabetes mellitus with diabetic polyneuropathy: Secondary | ICD-10-CM | POA: Diagnosis not present

## 2015-01-22 MED ORDER — CYANOCOBALAMIN 1000 MCG/ML IJ SOLN
1000.0000 ug | Freq: Once | INTRAMUSCULAR | Status: AC
  Administered 2015-01-22: 1000 ug via INTRAMUSCULAR

## 2015-01-31 ENCOUNTER — Ambulatory Visit (INDEPENDENT_AMBULATORY_CARE_PROVIDER_SITE_OTHER): Payer: Commercial Managed Care - HMO | Admitting: Family Medicine

## 2015-01-31 DIAGNOSIS — E538 Deficiency of other specified B group vitamins: Secondary | ICD-10-CM | POA: Diagnosis not present

## 2015-01-31 MED ORDER — CYANOCOBALAMIN 1000 MCG/ML IJ SOLN
1000.0000 ug | Freq: Once | INTRAMUSCULAR | Status: AC
  Administered 2015-01-31: 1000 ug via INTRAMUSCULAR

## 2015-02-14 ENCOUNTER — Ambulatory Visit (INDEPENDENT_AMBULATORY_CARE_PROVIDER_SITE_OTHER): Payer: Commercial Managed Care - HMO | Admitting: Family Medicine

## 2015-02-14 DIAGNOSIS — E538 Deficiency of other specified B group vitamins: Secondary | ICD-10-CM

## 2015-02-14 MED ORDER — CYANOCOBALAMIN 1000 MCG/ML IJ SOLN
1000.0000 ug | Freq: Once | INTRAMUSCULAR | Status: AC
Start: 2015-02-14 — End: 2015-02-14
  Administered 2015-02-14: 1000 ug via INTRAMUSCULAR

## 2015-02-14 NOTE — Progress Notes (Signed)
   Subjective:    Patient ID: [name], male    DOB: [birth year], 79 y.o.   MRN: [id number]  HPI    Review of Systems     Objective:   Physical Exam        Assessment & Plan:

## 2015-02-28 ENCOUNTER — Ambulatory Visit (INDEPENDENT_AMBULATORY_CARE_PROVIDER_SITE_OTHER): Payer: Commercial Managed Care - HMO | Admitting: Family Medicine

## 2015-02-28 DIAGNOSIS — E538 Deficiency of other specified B group vitamins: Secondary | ICD-10-CM

## 2015-02-28 MED ORDER — CYANOCOBALAMIN 1000 MCG/ML IJ SOLN
1000.0000 ug | Freq: Once | INTRAMUSCULAR | Status: AC
  Administered 2015-02-28: 1000 ug via INTRAMUSCULAR

## 2015-03-06 ENCOUNTER — Other Ambulatory Visit: Payer: Self-pay | Admitting: Family Medicine

## 2015-03-08 DIAGNOSIS — E119 Type 2 diabetes mellitus without complications: Secondary | ICD-10-CM | POA: Diagnosis not present

## 2015-03-08 DIAGNOSIS — H5213 Myopia, bilateral: Secondary | ICD-10-CM | POA: Diagnosis not present

## 2015-03-08 DIAGNOSIS — Z0100 Encounter for examination of eyes and vision without abnormal findings: Secondary | ICD-10-CM | POA: Diagnosis not present

## 2015-03-08 DIAGNOSIS — H43813 Vitreous degeneration, bilateral: Secondary | ICD-10-CM | POA: Diagnosis not present

## 2015-03-08 LAB — HM DIABETES EYE EXAM

## 2015-03-12 ENCOUNTER — Encounter: Payer: Self-pay | Admitting: Family Medicine

## 2015-03-14 ENCOUNTER — Ambulatory Visit (INDEPENDENT_AMBULATORY_CARE_PROVIDER_SITE_OTHER): Payer: Commercial Managed Care - HMO | Admitting: Family Medicine

## 2015-03-14 DIAGNOSIS — E538 Deficiency of other specified B group vitamins: Secondary | ICD-10-CM

## 2015-03-14 MED ORDER — CYANOCOBALAMIN 1000 MCG/ML IJ SOLN
1000.0000 ug | Freq: Once | INTRAMUSCULAR | Status: AC
  Administered 2015-03-14: 1000 ug via INTRAMUSCULAR

## 2015-03-28 ENCOUNTER — Ambulatory Visit (INDEPENDENT_AMBULATORY_CARE_PROVIDER_SITE_OTHER): Payer: Commercial Managed Care - HMO | Admitting: Family Medicine

## 2015-03-28 DIAGNOSIS — E538 Deficiency of other specified B group vitamins: Secondary | ICD-10-CM

## 2015-03-28 MED ORDER — CYANOCOBALAMIN 1000 MCG/ML IJ SOLN
1000.0000 ug | Freq: Once | INTRAMUSCULAR | Status: AC
Start: 2015-03-28 — End: 2015-03-28
  Administered 2015-03-28: 1000 ug via INTRAMUSCULAR

## 2015-04-02 DIAGNOSIS — B351 Tinea unguium: Secondary | ICD-10-CM | POA: Diagnosis not present

## 2015-04-02 DIAGNOSIS — E1142 Type 2 diabetes mellitus with diabetic polyneuropathy: Secondary | ICD-10-CM | POA: Diagnosis not present

## 2015-04-02 DIAGNOSIS — L84 Corns and callosities: Secondary | ICD-10-CM | POA: Diagnosis not present

## 2015-04-12 ENCOUNTER — Ambulatory Visit: Payer: Commercial Managed Care - HMO | Admitting: Family Medicine

## 2015-04-12 ENCOUNTER — Ambulatory Visit (INDEPENDENT_AMBULATORY_CARE_PROVIDER_SITE_OTHER): Payer: Commercial Managed Care - HMO | Admitting: Family Medicine

## 2015-04-12 ENCOUNTER — Encounter: Payer: Self-pay | Admitting: Family Medicine

## 2015-04-12 VITALS — BP 140/80 | HR 57 | Temp 97.4°F | Ht 70.5 in | Wt 198.5 lb

## 2015-04-12 DIAGNOSIS — Z0000 Encounter for general adult medical examination without abnormal findings: Secondary | ICD-10-CM | POA: Diagnosis not present

## 2015-04-12 DIAGNOSIS — E538 Deficiency of other specified B group vitamins: Secondary | ICD-10-CM | POA: Insufficient documentation

## 2015-04-12 DIAGNOSIS — E119 Type 2 diabetes mellitus without complications: Secondary | ICD-10-CM | POA: Diagnosis not present

## 2015-04-12 DIAGNOSIS — E039 Hypothyroidism, unspecified: Secondary | ICD-10-CM

## 2015-04-12 LAB — HEMOGLOBIN A1C: HEMOGLOBIN A1C: 7.3 % — AB (ref 4.6–6.5)

## 2015-04-12 LAB — TSH: TSH: 3.79 u[IU]/mL (ref 0.35–4.50)

## 2015-04-12 LAB — VITAMIN B12: Vitamin B-12: 413 pg/mL (ref 211–911)

## 2015-04-12 NOTE — Progress Notes (Signed)
Subjective:    Patient ID: [name], male    DOB: May 28, 1935, 80 y.o.   MRN: [id number]  HPI Here for complete physical and medical follow-up Chronic problems include history of hypertension, hyperlipidemia, hypothyroidism, type 2 diabetes, and recently diagnosed B12 deficiency. Severe B12 deficiency with level LV. Has been on injections and feels better overall with more energy. He was not describing any neuropathy symptoms. No history of anemia  All medications reviewed. Compliant with all. Diabetes has been stable for years. Last A1c 7.3%. Hypothyroidism. Recent TSH elevated. We have been adjusting his dosage of levothyroxine. No consistent exercise. He gets regular eye exams. Immunizations and colonoscopy up-to-date  Past Medical History  Diagnosis Date  . HYPOTHYROIDISM 05/08/2008  . DM w/o complication type II [id number]  . HYPERLIPIDEMIA 05/08/2008  . Unspecified essential hypertension 05/08/2008  . SUPERFICIAL PHLEBITIS 12/27/2008  . CELLULITIS, RIGHT LEG 04/22/2009  . ECZEMA 05/08/2008  . KNEE, ARTHRITIS, DEGEN./OSTEO 03/25/2009   Past Surgical History  Procedure Laterality Date  . Cholecystectomy    . Tonsillectomy  1946  . Nasal polyps    . Knee surgery      TKR Dr Alma Friendly 2007  . Cataract extraction  2005    bilateral  . Wrist surgery  2015    right side  . Colonoscopy  04-15-2004    tics    reports that he has never smoked. He does not have any smokeless tobacco history on file. He reports that he does not drink alcohol or use illicit drugs. family history includes Arthritis in his other; Heart disease in his other; Hypertension in his other. There is no history of Colon cancer. No Known Allergies     Review of Systems  Constitutional: Negative for fever, activity change, appetite change, fatigue and unexpected weight change.  HENT: Negative for congestion, ear pain and trouble swallowing.   Eyes: Negative for pain and visual disturbance.  Respiratory:  Negative for cough, shortness of breath and wheezing.   Cardiovascular: Negative for chest pain and palpitations.  Gastrointestinal: Negative for nausea, vomiting, abdominal pain, diarrhea, constipation, blood in stool, abdominal distention and rectal pain.  Endocrine: Negative for polydipsia and polyuria.  Genitourinary: Negative for dysuria, hematuria and testicular pain.  Musculoskeletal: Positive for arthralgias. Negative for joint swelling.  Skin: Negative for rash.  Neurological: Negative for dizziness, syncope and headaches.  Hematological: Negative for adenopathy.  Psychiatric/Behavioral: Negative for confusion and dysphoric mood.       Objective:   Physical Exam  Constitutional: He is oriented to person, place, and time. He appears well-developed and well-nourished. No distress.  HENT:  Head: Normocephalic and atraumatic.  Right Ear: External ear normal.  Left Ear: External ear normal.  Mouth/Throat: Oropharynx is clear and moist.  Eyes: Conjunctivae and EOM are normal. Pupils are equal, round, and reactive to light.  Neck: Normal range of motion. Neck supple. No thyromegaly present.  Cardiovascular: Normal rate, regular rhythm and normal heart sounds.   No murmur heard. Pulmonary/Chest: No respiratory distress. He has no wheezes. He has no rales.  Abdominal: Soft. Bowel sounds are normal. He exhibits no distension and no mass. There is no tenderness. There is no rebound and no guarding.  Musculoskeletal: He exhibits no edema.  Lymphadenopathy:    He has no cervical adenopathy.  Neurological: He is alert and oriented to person, place, and time. He displays normal reflexes. No cranial nerve deficit.  Skin: No rash noted.  Psychiatric: He has a normal mood and  affect.          Assessment & Plan:  Physical exam. Immunizations up-to-date. Regular exercise with walking as much as possible.  Hypothyroidism. Recently under place. Recheck TSH  B12 deficiency recently  diagnosed and now on B12 injections monthly.   recheck B12 level  Type 2 diabetes. History of fair control. Recheck A1c

## 2015-04-12 NOTE — Progress Notes (Signed)
Pre visit review using our clinic review tool, if applicable. No additional management support is needed unless otherwise documented below in the visit note. 

## 2015-04-25 ENCOUNTER — Ambulatory Visit (INDEPENDENT_AMBULATORY_CARE_PROVIDER_SITE_OTHER): Payer: Commercial Managed Care - HMO | Admitting: Family Medicine

## 2015-04-25 DIAGNOSIS — E538 Deficiency of other specified B group vitamins: Secondary | ICD-10-CM

## 2015-04-25 MED ORDER — CYANOCOBALAMIN 1000 MCG/ML IJ SOLN
1000.0000 ug | Freq: Once | INTRAMUSCULAR | Status: AC
  Administered 2015-04-25: 1000 ug via INTRAMUSCULAR

## 2015-05-23 ENCOUNTER — Ambulatory Visit (INDEPENDENT_AMBULATORY_CARE_PROVIDER_SITE_OTHER): Payer: Commercial Managed Care - HMO | Admitting: Family Medicine

## 2015-05-23 DIAGNOSIS — E538 Deficiency of other specified B group vitamins: Secondary | ICD-10-CM

## 2015-05-23 MED ORDER — CYANOCOBALAMIN 1000 MCG/ML IJ SOLN
1000.0000 ug | Freq: Once | INTRAMUSCULAR | Status: AC
  Administered 2015-05-23: 1000 ug via INTRAMUSCULAR

## 2015-06-11 DIAGNOSIS — E1142 Type 2 diabetes mellitus with diabetic polyneuropathy: Secondary | ICD-10-CM | POA: Diagnosis not present

## 2015-06-11 DIAGNOSIS — L84 Corns and callosities: Secondary | ICD-10-CM | POA: Diagnosis not present

## 2015-06-11 DIAGNOSIS — B351 Tinea unguium: Secondary | ICD-10-CM | POA: Diagnosis not present

## 2015-06-20 ENCOUNTER — Ambulatory Visit: Payer: Commercial Managed Care - HMO | Admitting: Family Medicine

## 2015-06-21 ENCOUNTER — Ambulatory Visit (INDEPENDENT_AMBULATORY_CARE_PROVIDER_SITE_OTHER): Payer: Commercial Managed Care - HMO | Admitting: Family Medicine

## 2015-06-21 DIAGNOSIS — E538 Deficiency of other specified B group vitamins: Secondary | ICD-10-CM | POA: Diagnosis not present

## 2015-06-21 MED ORDER — CYANOCOBALAMIN 1000 MCG/ML IJ SOLN
1000.0000 ug | Freq: Once | INTRAMUSCULAR | Status: AC
  Administered 2015-06-21: 1000 ug via INTRAMUSCULAR

## 2015-07-08 ENCOUNTER — Ambulatory Visit: Payer: Commercial Managed Care - HMO | Admitting: Family Medicine

## 2015-07-17 ENCOUNTER — Telehealth: Payer: Self-pay | Admitting: Family Medicine

## 2015-07-17 MED ORDER — ACCU-CHEK FASTCLIX LANCETS MISC
Status: DC
Start: 2015-07-17 — End: 2017-02-24

## 2015-07-17 NOTE — Telephone Encounter (Signed)
Pt needs new rx accu chek  fast clix lancets send to [facility] order

## 2015-07-17 NOTE — Telephone Encounter (Signed)
[facility]

## 2015-07-23 ENCOUNTER — Ambulatory Visit (INDEPENDENT_AMBULATORY_CARE_PROVIDER_SITE_OTHER): Payer: Commercial Managed Care - HMO | Admitting: Family Medicine

## 2015-07-23 DIAGNOSIS — E538 Deficiency of other specified B group vitamins: Secondary | ICD-10-CM | POA: Diagnosis not present

## 2015-07-23 MED ORDER — CYANOCOBALAMIN 1000 MCG/ML IJ SOLN
1000.0000 ug | Freq: Once | INTRAMUSCULAR | Status: AC
  Administered 2015-07-23: 1000 ug via INTRAMUSCULAR

## 2015-07-23 NOTE — Progress Notes (Signed)
Pre visit review using our clinic review tool, if applicable. No additional management support is needed unless otherwise documented below in the visit note. 

## 2015-08-13 DIAGNOSIS — E1151 Type 2 diabetes mellitus with diabetic peripheral angiopathy without gangrene: Secondary | ICD-10-CM | POA: Diagnosis not present

## 2015-08-13 DIAGNOSIS — L84 Corns and callosities: Secondary | ICD-10-CM | POA: Diagnosis not present

## 2015-08-13 DIAGNOSIS — B351 Tinea unguium: Secondary | ICD-10-CM | POA: Diagnosis not present

## 2015-08-26 ENCOUNTER — Ambulatory Visit (INDEPENDENT_AMBULATORY_CARE_PROVIDER_SITE_OTHER): Payer: Commercial Managed Care - HMO | Admitting: Family Medicine

## 2015-08-26 DIAGNOSIS — E538 Deficiency of other specified B group vitamins: Secondary | ICD-10-CM | POA: Diagnosis not present

## 2015-08-26 MED ORDER — CYANOCOBALAMIN 1000 MCG/ML IJ SOLN
1000.0000 ug | Freq: Once | INTRAMUSCULAR | Status: AC
  Administered 2015-08-26: 1000 ug via INTRAMUSCULAR

## 2015-08-26 MED ORDER — CYANOCOBALAMIN 1000 MCG/ML IJ SOLN: 1000.0000 ug | Freq: Once | INTRAMUSCULAR | Status: DC

## 2015-09-23 ENCOUNTER — Ambulatory Visit (INDEPENDENT_AMBULATORY_CARE_PROVIDER_SITE_OTHER): Payer: Commercial Managed Care - HMO | Admitting: Family Medicine

## 2015-09-23 DIAGNOSIS — E538 Deficiency of other specified B group vitamins: Secondary | ICD-10-CM | POA: Diagnosis not present

## 2015-09-23 MED ORDER — CYANOCOBALAMIN 1000 MCG/ML IJ SOLN
1000.0000 ug | INTRAMUSCULAR | Status: AC
  Administered 2015-09-23: 1000 ug via INTRAMUSCULAR

## 2015-10-10 ENCOUNTER — Ambulatory Visit: Payer: Commercial Managed Care - HMO | Admitting: Family Medicine

## 2015-10-11 ENCOUNTER — Encounter: Payer: Self-pay | Admitting: Family Medicine

## 2015-10-11 ENCOUNTER — Ambulatory Visit (INDEPENDENT_AMBULATORY_CARE_PROVIDER_SITE_OTHER): Payer: Commercial Managed Care - HMO | Admitting: Family Medicine

## 2015-10-11 VITALS — BP 132/82 | HR 73 | Temp 98.9°F | Ht 70.5 in | Wt 193.6 lb

## 2015-10-11 DIAGNOSIS — E119 Type 2 diabetes mellitus without complications: Secondary | ICD-10-CM

## 2015-10-11 DIAGNOSIS — I10 Essential (primary) hypertension: Secondary | ICD-10-CM

## 2015-10-11 DIAGNOSIS — E039 Hypothyroidism, unspecified: Secondary | ICD-10-CM | POA: Diagnosis not present

## 2015-10-11 DIAGNOSIS — E785 Hyperlipidemia, unspecified: Secondary | ICD-10-CM

## 2015-10-11 DIAGNOSIS — E538 Deficiency of other specified B group vitamins: Secondary | ICD-10-CM

## 2015-10-11 LAB — HEPATIC FUNCTION PANEL
ALBUMIN: 4.4 g/dL (ref 3.5–5.2)
ALK PHOS: 30 U/L — AB (ref 39–117)
ALT: 22 U/L (ref 0–53)
AST: 28 U/L (ref 0–37)
BILIRUBIN TOTAL: 1.7 mg/dL — AB (ref 0.2–1.2)
Bilirubin, Direct: 0.4 mg/dL — ABNORMAL HIGH (ref 0.0–0.3)
Total Protein: 7.2 g/dL (ref 6.0–8.3)

## 2015-10-11 LAB — HEMOGLOBIN A1C: HEMOGLOBIN A1C: 6.9 % — AB (ref 4.6–6.5)

## 2015-10-11 LAB — LIPID PANEL
CHOLESTEROL: 113 mg/dL (ref 0–200)
HDL: 35.4 mg/dL — ABNORMAL LOW (ref >39.00)
LDL Cholesterol: 54 mg/dL (ref 0–99)
NonHDL: 77.57
TRIGLYCERIDES: 117 mg/dL (ref 0.0–149.0)
Total CHOL/HDL Ratio: 3
VLDL: 23.4 mg/dL (ref 0.0–40.0)

## 2015-10-11 LAB — HM DIABETES FOOT EXAM: HM Diabetic Foot Exam: NORMAL

## 2015-10-11 LAB — BASIC METABOLIC PANEL
BUN: 17 mg/dL (ref 6–23)
CO2: 24 meq/L (ref 19–32)
CREATININE: 1.4 mg/dL (ref 0.40–1.50)
Calcium: 9.7 mg/dL (ref 8.4–10.5)
Chloride: 99 mEq/L (ref 96–112)
GFR: 52.05 mL/min — AB (ref >60.00)
Glucose, Bld: 120 mg/dL — ABNORMAL HIGH (ref 70–99)
Potassium: 3.4 mEq/L — ABNORMAL LOW (ref 3.5–5.1)
Sodium: 136 mEq/L (ref 135–145)

## 2015-10-11 NOTE — Progress Notes (Signed)
Pre visit review using our clinic review tool, if applicable. No additional management support is needed unless otherwise documented below in the visit note. 

## 2015-10-11 NOTE — Patient Instructions (Signed)
Let's stop the B12 IM and go to DAILY oral B12 1,000 ug.   We will plan to repeat B12 in about 6 months.

## 2015-10-11 NOTE — Progress Notes (Signed)
Subjective:     Patient ID: [name], male   DOB: 1935-08-18, 80 y.o.   MRN: [id number]  HPI Patient is seen for medical follow-up. He has history of type 2 diabetes, hypertension, B12 deficiency, hypothyroidism, osteoarthritis, hyperlipidemia. No history of CAD. Medications reviewed. He has been on once monthly B12 replacement now for several months. We have discussed switching him over to oral. He does feel better overall since starting B12. He thinks his memory has improved somewhat. No chest pains. No dizziness. No headaches. He has some mild peripheral edema which is unchanged. Denies any medication side effects. He sees podiatrist regularly for foot exams.  Past Medical History:  Diagnosis Date  . CELLULITIS, RIGHT LEG 04/22/2009  . DM w/o complication type II Q000111Q  . ECZEMA 05/08/2008  . HYPERLIPIDEMIA 05/08/2008  . HYPOTHYROIDISM 05/08/2008  . KNEE, ARTHRITIS, DEGEN./OSTEO 03/25/2009  . SUPERFICIAL PHLEBITIS 12/27/2008  . Unspecified essential hypertension 05/08/2008   Past Surgical History:  Procedure Laterality Date  . CATARACT EXTRACTION  2005   bilateral  . CHOLECYSTECTOMY    . COLONOSCOPY  04-15-2004   tics  . KNEE SURGERY     TKR Dr Alma Friendly 2007  . nasal polyps    . TONSILLECTOMY  1946  . WRIST SURGERY  2015   right side    reports that he has never smoked. He does not have any smokeless tobacco history on file. He reports that he does not drink alcohol or use drugs. family history includes Arthritis in his other; Heart disease in his other; Hypertension in his other. No Known Allergies   Review of Systems  Constitutional: Negative for appetite change and fatigue.  Eyes: Negative for visual disturbance.  Respiratory: Negative for cough, chest tightness and shortness of breath.   Cardiovascular: Negative for chest pain and palpitations.  Gastrointestinal: Negative for abdominal pain.  Endocrine: Negative for polydipsia and polyuria.  Neurological: Negative  for dizziness, syncope, weakness, light-headedness and headaches.       Objective:   Physical Exam  Constitutional: He is oriented to person, place, and time. He appears well-developed and well-nourished.  HENT:  Right Ear: External ear normal.  Left Ear: External ear normal.  Mouth/Throat: Oropharynx is clear and moist.  Eyes: Pupils are equal, round, and reactive to light.  Neck: Neck supple. No thyromegaly present.  Cardiovascular: Normal rate and regular rhythm.   Pulmonary/Chest: Effort normal and breath sounds normal. No respiratory distress. He has no wheezes. He has no rales.  Musculoskeletal:  Trace nonpitting edema legs bilaterally  Neurological: He is alert and oriented to person, place, and time.       Assessment:     #1 type 2 diabetes. History of stable control for several years  #2 hypertension well controlled  #3 hyperlipidemia  #4 hypothyroidism  #5 B12 deficiency    Plan:     -We'll try transition to oral B12. Recommend take over-the-counter B12 1000 g once daily and recheck B12 level in 6 months -Repeat labs today with A1c, lipid, hepatic, basic metabolic panel -Continue current medications otherwise -Reminder for flu vaccine this fall  [name] MD [facility]

## 2015-10-15 ENCOUNTER — Telehealth: Payer: Self-pay | Admitting: Family Medicine

## 2015-10-15 DIAGNOSIS — E1151 Type 2 diabetes mellitus with diabetic peripheral angiopathy without gangrene: Secondary | ICD-10-CM | POA: Diagnosis not present

## 2015-10-15 DIAGNOSIS — B351 Tinea unguium: Secondary | ICD-10-CM | POA: Diagnosis not present

## 2015-10-15 DIAGNOSIS — L84 Corns and callosities: Secondary | ICD-10-CM | POA: Diagnosis not present

## 2015-10-15 NOTE — Telephone Encounter (Signed)
Pt is aware of results. 

## 2015-10-15 NOTE — Telephone Encounter (Signed)
Pt returning the call think it maybe in reference to test results.

## 2015-10-25 ENCOUNTER — Ambulatory Visit: Payer: Commercial Managed Care - HMO | Admitting: Family Medicine

## 2015-12-11 ENCOUNTER — Other Ambulatory Visit: Payer: Self-pay | Admitting: Family Medicine

## 2015-12-24 DIAGNOSIS — E1151 Type 2 diabetes mellitus with diabetic peripheral angiopathy without gangrene: Secondary | ICD-10-CM | POA: Diagnosis not present

## 2015-12-24 DIAGNOSIS — L84 Corns and callosities: Secondary | ICD-10-CM | POA: Diagnosis not present

## 2015-12-24 DIAGNOSIS — B351 Tinea unguium: Secondary | ICD-10-CM | POA: Diagnosis not present

## 2016-02-06 DIAGNOSIS — H5200 Hypermetropia, unspecified eye: Secondary | ICD-10-CM | POA: Diagnosis not present

## 2016-02-06 DIAGNOSIS — E109 Type 1 diabetes mellitus without complications: Secondary | ICD-10-CM | POA: Diagnosis not present

## 2016-02-06 DIAGNOSIS — I10 Essential (primary) hypertension: Secondary | ICD-10-CM | POA: Diagnosis not present

## 2016-02-06 LAB — HM DIABETES EYE EXAM

## 2016-03-02 ENCOUNTER — Encounter: Payer: Self-pay | Admitting: Family Medicine

## 2016-03-03 DIAGNOSIS — B351 Tinea unguium: Secondary | ICD-10-CM | POA: Diagnosis not present

## 2016-03-03 DIAGNOSIS — L84 Corns and callosities: Secondary | ICD-10-CM | POA: Diagnosis not present

## 2016-03-03 DIAGNOSIS — E1151 Type 2 diabetes mellitus with diabetic peripheral angiopathy without gangrene: Secondary | ICD-10-CM | POA: Diagnosis not present

## 2016-04-13 ENCOUNTER — Ambulatory Visit: Payer: Commercial Managed Care - HMO | Admitting: Family Medicine

## 2016-04-13 ENCOUNTER — Other Ambulatory Visit (INDEPENDENT_AMBULATORY_CARE_PROVIDER_SITE_OTHER): Payer: Medicare HMO

## 2016-04-13 ENCOUNTER — Encounter: Payer: Self-pay | Admitting: Family Medicine

## 2016-04-13 DIAGNOSIS — E538 Deficiency of other specified B group vitamins: Secondary | ICD-10-CM | POA: Diagnosis not present

## 2016-04-13 LAB — VITAMIN B12: Vitamin B-12: 574 pg/mL (ref 211–911)

## 2016-04-23 DIAGNOSIS — R69 Illness, unspecified: Secondary | ICD-10-CM | POA: Diagnosis not present

## 2016-04-28 ENCOUNTER — Ambulatory Visit (INDEPENDENT_AMBULATORY_CARE_PROVIDER_SITE_OTHER): Payer: Medicare HMO | Admitting: Family Medicine

## 2016-04-28 ENCOUNTER — Encounter: Payer: Self-pay | Admitting: Family Medicine

## 2016-04-28 VITALS — BP 120/80 | HR 74 | Temp 98.3°F | Wt 192.2 lb

## 2016-04-28 DIAGNOSIS — I10 Essential (primary) hypertension: Secondary | ICD-10-CM | POA: Diagnosis not present

## 2016-04-28 DIAGNOSIS — E039 Hypothyroidism, unspecified: Secondary | ICD-10-CM

## 2016-04-28 DIAGNOSIS — E119 Type 2 diabetes mellitus without complications: Secondary | ICD-10-CM | POA: Diagnosis not present

## 2016-04-28 LAB — HEMOGLOBIN A1C: Hgb A1c MFr Bld: 6.7 % — ABNORMAL HIGH (ref 4.6–6.5)

## 2016-04-28 LAB — TSH: TSH: 3.95 u[IU]/mL (ref 0.35–4.50)

## 2016-04-28 NOTE — Progress Notes (Signed)
Subjective:     Patient ID: [name], male   DOB: [birth year], 81 y.o.   MRN: [id number]  HPI Patient seen for medical follow-up. He has type 2 diabetes which has been well controlled for years. Last A1c 6.9%. Medications reviewed. No recent side effects or other concerns. His lipids are checked last August and stable. He has hypothyroidism on levothyroxine this has been stable for several years. Is due for follow-up TSH. He sees podiatrist about every 2-3 months for nail trimming. Denies any recent foot concerns. Frequent he walks about a mile per day for exercise. No history of CAD. No history of smoking.  Past Medical History:  Diagnosis Date  . CELLULITIS, RIGHT LEG 04/22/2009  . DM w/o complication type II Q000111Q  . ECZEMA 05/08/2008  . HYPERLIPIDEMIA 05/08/2008  . HYPOTHYROIDISM 05/08/2008  . KNEE, ARTHRITIS, DEGEN./OSTEO 03/25/2009  . SUPERFICIAL PHLEBITIS 12/27/2008  . Unspecified essential hypertension 05/08/2008   Past Surgical History:  Procedure Laterality Date  . CATARACT EXTRACTION  2005   bilateral  . CHOLECYSTECTOMY    . COLONOSCOPY  04-15-2004   tics  . KNEE SURGERY     TKR Dr Alma Friendly 2007  . nasal polyps    . TONSILLECTOMY  1946  . WRIST SURGERY  2015   right side    reports that he has never smoked. He has never used smokeless tobacco. He reports that he does not drink alcohol or use drugs. family history includes Arthritis in his other; Heart disease in his other; Hypertension in his other. No Known Allergies   Review of Systems  Constitutional: Negative for fatigue and unexpected weight change.  Eyes: Negative for visual disturbance.  Respiratory: Negative for cough, chest tightness and shortness of breath.   Cardiovascular: Negative for chest pain, palpitations and leg swelling.  Endocrine: Negative for polydipsia and polyuria.  Neurological: Negative for dizziness, syncope, weakness, light-headedness and headaches.       Objective:   Physical Exam   Constitutional: He is oriented to person, place, and time. He appears well-developed and well-nourished.  HENT:  Right Ear: External ear normal.  Left Ear: External ear normal.  Mouth/Throat: Oropharynx is clear and moist.  Eyes: Pupils are equal, round, and reactive to light.  Neck: Neck supple. No thyromegaly present.  Cardiovascular: Normal rate and regular rhythm.   Pulmonary/Chest: Effort normal and breath sounds normal. No respiratory distress. He has no wheezes. He has no rales.  Musculoskeletal: He exhibits no edema.  Neurological: He is alert and oriented to person, place, and time.       Assessment:     #1 type 2 diabetes. History of good control  #2 hypothyroidism  #3 hypertension stable and at goal  #4 history of dyslipidemia. Lipids were stable last August    Plan:     -Continue regular podiatry checkups -Check labs today with TSH and hemoglobin A1c -Continue regular exercise habits with walking -Routine follow-up in 6 months -Continue yearly eye exams with most recent being December 2017  [name] MD [facility]

## 2016-04-28 NOTE — Progress Notes (Signed)
Pre visit review using our clinic review tool, if applicable. No additional management support is needed unless otherwise documented below in the visit note. 

## 2016-05-01 ENCOUNTER — Other Ambulatory Visit: Payer: Self-pay | Admitting: Family Medicine

## 2016-05-19 DIAGNOSIS — B351 Tinea unguium: Secondary | ICD-10-CM | POA: Diagnosis not present

## 2016-05-19 DIAGNOSIS — E1151 Type 2 diabetes mellitus with diabetic peripheral angiopathy without gangrene: Secondary | ICD-10-CM | POA: Diagnosis not present

## 2016-05-19 DIAGNOSIS — M79671 Pain in right foot: Secondary | ICD-10-CM | POA: Diagnosis not present

## 2016-05-19 DIAGNOSIS — M79672 Pain in left foot: Secondary | ICD-10-CM | POA: Diagnosis not present

## 2016-05-19 DIAGNOSIS — L84 Corns and callosities: Secondary | ICD-10-CM | POA: Diagnosis not present

## 2016-06-15 DIAGNOSIS — H903 Sensorineural hearing loss, bilateral: Secondary | ICD-10-CM | POA: Diagnosis not present

## 2016-07-28 DIAGNOSIS — M79675 Pain in left toe(s): Secondary | ICD-10-CM | POA: Diagnosis not present

## 2016-07-28 DIAGNOSIS — E1151 Type 2 diabetes mellitus with diabetic peripheral angiopathy without gangrene: Secondary | ICD-10-CM | POA: Diagnosis not present

## 2016-07-28 DIAGNOSIS — B351 Tinea unguium: Secondary | ICD-10-CM | POA: Diagnosis not present

## 2016-07-28 DIAGNOSIS — L84 Corns and callosities: Secondary | ICD-10-CM | POA: Diagnosis not present

## 2016-07-29 ENCOUNTER — Other Ambulatory Visit: Payer: Self-pay | Admitting: Family Medicine

## 2016-09-03 NOTE — Progress Notes (Addendum)
Subjective:   [name] is a 81 y.o. male who presents for an Initial Medicare Annual Wellness Visit.  Review of Systems  No ROS.  Medicare Wellness Visit. Additional risk factors are reflected in the social history.  Cardiac Risk Factors include: advanced age (>20men, >65 women);diabetes mellitus;dyslipidemia;male gender;hypertension   Sleep patterns: 7-8 hrs/night. Wakes up feeling rested. Naps during the day. Home Safety/Smoke Alarms: Feels safe in home. Smoke alarms in place.  Living environment; residence and Firearm Safety: Lives in split level home with wife.  Seat Belt Safety/Bike Helmet: Wears seat belt.   Counseling:   Dental- Every 6 months.  Male:   CCS-    10/01/2014 5 year recall. PSA- No results found for: PSA     Objective:    Today's Vitals   09/10/16 0930  BP: 118/62  Pulse: (!) 53  Resp: 16  SpO2: 98%  Weight: 192 lb (87.1 kg)  Height: 5\' 11"  (1.803 m)   Body mass index is 26.78 kg/m.  Current Medications (verified) Outpatient Encounter Prescriptions as of 09/10/2016  Medication Sig  . ACCU-CHEK AVIVA PLUS test strip CHECK BLOOD SUGARS ONCE PER DAY  . ACCU-CHEK FASTCLIX LANCETS MISC Check Blood Sugars 2 Times Daily or As Needed. DX: E11.9  . amLODipine (NORVASC) 10 MG tablet TAKE 1 TABLET EVERY DAY  . aspirin EC 325 MG tablet Take 325 mg by mouth daily.  . benazepril (LOTENSIN) 20 MG tablet TAKE 1 TABLET EVERY DAY  . desoximetasone (TOPICORT) 0.25 % cream Apply 1 application topically as needed.  . hydrochlorothiazide (HYDRODIURIL) 25 MG tablet TAKE 1 TABLET EVERY DAY  . levothyroxine (SYNTHROID, LEVOTHROID) 112 MCG tablet TAKE 1 TABLET EVERY DAY  . metFORMIN (GLUCOPHAGE) 500 MG tablet TAKE 2 TABLETS IN THE MORNING  AND TAKE 1 TABLET IN THE EVENING  . Omega-3 Fatty Acids (FISH OIL) 1200 MG CAPS Take 1 capsule by mouth daily.  . Potassium 99 MG TABS Take 1 tablet by mouth daily.  . propranolol (INDERAL) 40 MG tablet TAKE 1 TABLET EVERY DAY    . simvastatin (ZOCOR) 40 MG tablet TAKE 1 TABLET AT BEDTIME   Facility-Administered Encounter Medications as of 09/10/2016  Medication  . cyanocobalamin ((VITAMIN B-12)) injection 1,000 mcg    Allergies (verified) Patient has no known allergies.   History: Past Medical History:  Diagnosis Date  . CELLULITIS, RIGHT LEG 04/22/2009  . DM w/o complication type II 4/96/7591  . ECZEMA 05/08/2008  . HYPERLIPIDEMIA 05/08/2008  . HYPOTHYROIDISM 05/08/2008  . KNEE, ARTHRITIS, DEGEN./OSTEO 03/25/2009  . SUPERFICIAL PHLEBITIS 12/27/2008  . Unspecified essential hypertension 05/08/2008   Past Surgical History:  Procedure Laterality Date  . CATARACT EXTRACTION  2005   bilateral  . CHOLECYSTECTOMY    . COLONOSCOPY  04-15-2004   tics  . KNEE SURGERY     TKR Dr Alma Friendly 2007  . nasal polyps    . TONSILLECTOMY  1946  . WRIST SURGERY  2015   right side   Family History  Problem Relation Age of Onset  . Arthritis Other   . Hypertension Other   . Heart disease Other   . Colon cancer Neg Hx    Social History   Occupational History  . Not on file.   Social History Main Topics  . Smoking status: Never Smoker  . Smokeless tobacco: Never Used  . Alcohol use No  . Drug use: No  . Sexual activity: Not on file   Tobacco Counseling Counseling given: Not Answered  Activities of Daily Living In your present state of health, do you have any difficulty performing the following activities: 09/10/2016  Hearing? N  Vision? N  Difficulty concentrating or making decisions? N  Walking or climbing stairs? N  Dressing or bathing? N  Doing errands, shopping? N  Preparing Food and eating ? N  Using the Toilet? N  In the past six months, have you accidently leaked urine? N  Do you have problems with loss of bowel control? N  Managing your Medications? N  Managing your Finances? N  Housekeeping or managing your Housekeeping? N  Some recent data might be hidden    Immunizations and Health  Maintenance Immunization History  Administered Date(s) Administered  . Influenza Split 12/01/2010, 11/28/2012  . Influenza Whole 11/25/2009  . Influenza,inj,Quad PF,36+ Mos 11/29/2013  . Influenza-Unspecified 11/14/2014, 10/14/2015  . Pneumococcal Conjugate-13 07/04/2014  . Pneumococcal Polysaccharide-23 02/24/2007  . Td 07/11/2009  . Zoster 07/07/2010   There are no preventive care reminders to display for this patient.  Patient Care Team: [name], MD as PCP - General  Indicate any recent Medical Services you may have received from other than [facility] providers in the past year (date may be approximate).    Assessment:   This is a routine wellness examination for [name]. Physical assessment deferred to PCP.   Hearing/Vision screen Hearing Screening Comments: Has hearing aids for both ears. Vision Screening Comments: Twice/year. Cataracts removed bilat. Wears bifocals.  Dietary issues and exercise activities discussed: Current Exercise Habits: The patient does not participate in regular exercise at present   Diet (meal preparation, eat out, water intake, caffeinated beverages, dairy products, fruits and vegetables): 3 meals/day. At least 5 glasses water/day. 1 cup coffee. Diet coke almost every day.  Breakfast: Cereal or eggs and biscuit. Lunch: Sandwich or soup. Dinner: Sometimes eats out. Wife usually cooks meat and vegetables.     Goals    . Get back into the gym.       Depression Screen PHQ 2/9 Scores 09/10/2016 04/28/2016 01/07/2015 01/03/2014  PHQ - 2 Score 0 0 0 0    Fall Risk Fall Risk  09/10/2016 04/28/2016 01/07/2015 01/03/2014 07/03/2013  Falls in the past year? No No Yes No No  Number falls in past yr: - - 2 or more - -  Injury with Fall? - - No - -  Risk Factor Category  - - High Fall Risk - -  Follow up - - Falls evaluation completed - -    Cognitive Function:   Ad8 score reviewed for issues:  Issues making decisions:no  Less interest in hobbies /  activities:no  Repeats questions, stories (family complaining):no  Trouble using ordinary gadgets (microwave, computer, phone):no  Forgets the month or year: no  Mismanaging finances: no  Remembering appts:no  Daily problems with thinking and/or memory:no Ad8 score is=0      Screening Tests Health Maintenance  Topic Date Due  . INFLUENZA VACCINE  09/23/2016  . FOOT EXAM  10/10/2016  . HEMOGLOBIN A1C  10/29/2016  . OPHTHALMOLOGY EXAM  02/05/2017  . TETANUS/TDAP  07/12/2019  . COLONOSCOPY  10/01/2019  . PNA vac Low Risk Adult  Completed       Plan:   Follow up with PCP as scheduled.  I have personally reviewed and noted the following in the patient's chart:   . Medical and social history . Use of alcohol, tobacco or illicit drugs  . Current medications and supplements . Functional ability and  status . Nutritional status . Physical activity . Advanced directives . List of other physicians . Vitals . Screenings to include cognitive, depression, and falls . Referrals and appointments  In addition, I have reviewed and discussed with patient certain preventive protocols, quality metrics, and best practice recommendations. A written personalized care plan for preventive services as well as general preventive health recommendations were provided to patient.     Ree Edman, RN   09/10/2016   Above notes reviewed. Agree with assessment and plan  [name] MD [facility]

## 2016-09-03 NOTE — Progress Notes (Signed)
Pre visit review using our clinic review tool, if applicable. No additional management support is needed unless otherwise documented below in the visit note. 

## 2016-09-10 ENCOUNTER — Ambulatory Visit (INDEPENDENT_AMBULATORY_CARE_PROVIDER_SITE_OTHER): Payer: Medicare HMO

## 2016-09-10 VITALS — BP 118/62 | HR 53 | Resp 16 | Ht 71.0 in | Wt 192.0 lb

## 2016-09-10 DIAGNOSIS — Z0000 Encounter for general adult medical examination without abnormal findings: Secondary | ICD-10-CM | POA: Diagnosis not present

## 2016-09-10 NOTE — Patient Instructions (Signed)
Preventive Care 79 Years and Older, Male Preventive care refers to lifestyle choices and visits with your health care provider that can promote health and wellness. What does preventive care include?  A yearly physical exam. This is also called an annual well check.  Dental exams once or twice a year.  Routine eye exams. Ask your health care provider how often you should have your eyes checked.  Personal lifestyle choices, including: ? Daily care of your teeth and gums. ? Regular physical activity. ? Eating a healthy diet. ? Avoiding tobacco and drug use. ? Limiting alcohol use. ? Practicing safe sex. ? Taking low doses of aspirin every day. ? Taking vitamin and mineral supplements as recommended by your health care provider. What happens during an annual well check? The services and screenings done by your health care provider during your annual well check will depend on your age, overall health, lifestyle risk factors, and family history of disease. Counseling Your health care provider may ask you questions about your:  Alcohol use.  Tobacco use.  Drug use.  Emotional well-being.  Home and relationship well-being.  Sexual activity.  Eating habits.  History of falls.  Memory and ability to understand (cognition).  Work and work environment.  Screening You may have the following tests or measurements:  Height, weight, and BMI.  Blood pressure.  Lipid and cholesterol levels. These may be checked every 5 years, or more frequently if you are over 50 years old.  Skin check.  Lung cancer screening. You may have this screening every year starting at age 55 if you have a 30-pack-year history of smoking and currently smoke or have quit within the past 15 years.  Fecal occult blood test (FOBT) of the stool. You may have this test every year starting at age 50.  Flexible sigmoidoscopy or colonoscopy. You may have a sigmoidoscopy every 5 years or a colonoscopy every 10  years starting at age 50.  Prostate cancer screening. Recommendations will vary depending on your family history and other risks.  Hepatitis C blood test.  Hepatitis B blood test.  Sexually transmitted disease (STD) testing.  Diabetes screening. This is done by checking your blood sugar (glucose) after you have not eaten for a while (fasting). You may have this done every 1-3 years.  Abdominal aortic aneurysm (AAA) screening. You may need this if you are a current or former smoker.  Osteoporosis. You may be screened starting at age 70 if you are at high risk.  Talk with your health care provider about your test results, treatment options, and if necessary, the need for more tests. Vaccines Your health care provider may recommend certain vaccines, such as:  Influenza vaccine. This is recommended every year.  Tetanus, diphtheria, and acellular pertussis (Tdap, Td) vaccine. You may need a Td booster every 10 years.  Varicella vaccine. You may need this if you have not been vaccinated.  Zoster vaccine. You may need this after age 60.  Measles, mumps, and rubella (MMR) vaccine. You may need at least one dose of MMR if you were born in 1957 or later. You may also need a second dose.  Pneumococcal 13-valent conjugate (PCV13) vaccine. One dose is recommended after age 65.  Pneumococcal polysaccharide (PPSV23) vaccine. One dose is recommended after age 65.  Meningococcal vaccine. You may need this if you have certain conditions.  Hepatitis A vaccine. You may need this if you have certain conditions or if you travel or work in places where you   may be exposed to hepatitis A.  Hepatitis B vaccine. You may need this if you have certain conditions or if you travel or work in places where you may be exposed to hepatitis B.  Haemophilus influenzae type b (Hib) vaccine. You may need this if you have certain risk factors.  Talk to your health care provider about which screenings and vaccines  you need and how often you need them. This information is not intended to replace advice given to you by your health care provider. Make sure you discuss any questions you have with your health care provider. Document Released: 03/08/2015 Document Revised: 10/30/2015 Document Reviewed: 12/11/2014 Elsevier Interactive Patient Education  2017 [employer].

## 2016-10-06 DIAGNOSIS — L84 Corns and callosities: Secondary | ICD-10-CM | POA: Diagnosis not present

## 2016-10-06 DIAGNOSIS — B351 Tinea unguium: Secondary | ICD-10-CM | POA: Diagnosis not present

## 2016-10-06 DIAGNOSIS — M79676 Pain in unspecified toe(s): Secondary | ICD-10-CM | POA: Diagnosis not present

## 2016-10-06 DIAGNOSIS — E1151 Type 2 diabetes mellitus with diabetic peripheral angiopathy without gangrene: Secondary | ICD-10-CM | POA: Diagnosis not present

## 2016-10-08 LAB — HM DIABETES FOOT EXAM

## 2016-10-09 ENCOUNTER — Encounter: Payer: Self-pay | Admitting: Family Medicine

## 2016-10-28 ENCOUNTER — Ambulatory Visit (INDEPENDENT_AMBULATORY_CARE_PROVIDER_SITE_OTHER): Payer: Medicare HMO | Admitting: Family Medicine

## 2016-10-28 ENCOUNTER — Encounter: Payer: Self-pay | Admitting: Family Medicine

## 2016-10-28 VITALS — BP 130/80 | HR 74 | Temp 98.4°F | Wt 190.8 lb

## 2016-10-28 DIAGNOSIS — Z23 Encounter for immunization: Secondary | ICD-10-CM

## 2016-10-28 DIAGNOSIS — E538 Deficiency of other specified B group vitamins: Secondary | ICD-10-CM

## 2016-10-28 DIAGNOSIS — E119 Type 2 diabetes mellitus without complications: Secondary | ICD-10-CM | POA: Diagnosis not present

## 2016-10-28 DIAGNOSIS — E785 Hyperlipidemia, unspecified: Secondary | ICD-10-CM

## 2016-10-28 DIAGNOSIS — I10 Essential (primary) hypertension: Secondary | ICD-10-CM

## 2016-10-28 DIAGNOSIS — G3184 Mild cognitive impairment, so stated: Secondary | ICD-10-CM

## 2016-10-28 LAB — HEPATIC FUNCTION PANEL
ALK PHOS: 37 U/L — AB (ref 39–117)
ALT: 17 U/L (ref 0–53)
AST: 19 U/L (ref 0–37)
Albumin: 4.4 g/dL (ref 3.5–5.2)
BILIRUBIN DIRECT: 0.3 mg/dL (ref 0.0–0.3)
TOTAL PROTEIN: 7.1 g/dL (ref 6.0–8.3)
Total Bilirubin: 1.6 mg/dL — ABNORMAL HIGH (ref 0.2–1.2)

## 2016-10-28 LAB — BASIC METABOLIC PANEL
BUN: 17 mg/dL (ref 6–23)
CALCIUM: 10.2 mg/dL (ref 8.4–10.5)
CO2: 28 meq/L (ref 19–32)
Chloride: 98 mEq/L (ref 96–112)
Creatinine, Ser: 1.21 mg/dL (ref 0.40–1.50)
GFR: 61.43 mL/min (ref >60.00)
Glucose, Bld: 116 mg/dL — ABNORMAL HIGH (ref 70–99)
POTASSIUM: 4 meq/L (ref 3.5–5.1)
SODIUM: 136 meq/L (ref 135–145)

## 2016-10-28 LAB — LIPID PANEL
CHOL/HDL RATIO: 3
Cholesterol: 112 mg/dL (ref 0–200)
HDL: 33.7 mg/dL — AB (ref >39.00)
LDL Cholesterol: 61 mg/dL (ref 0–99)
NonHDL: 78.32
Triglycerides: 85 mg/dL (ref 0.0–149.0)
VLDL: 17 mg/dL (ref 0.0–40.0)

## 2016-10-28 LAB — POCT GLYCOSYLATED HEMOGLOBIN (HGB A1C): Hemoglobin A1C: 6.2

## 2016-10-28 NOTE — Progress Notes (Signed)
Subjective:     Patient ID: [name], male   DOB: 23-Apr-1935, 81 y.o.   MRN: [id number]  HPI Patient here for routine medical follow-up. He has history of B12 deficiency, type 2 diabetes, osteoarthritis, hypothyroidism, hyperlipidemia, hypertension. He's had some concerns about short-term memory deficit. Recent B12 level last winter was back up to normal and had been extremely low couple years ago. Thyroid at goal  He is not driving much anymore. Spends a lot of his time doing yard work such as mowing. No consistent exercise.  Type 2 diabetes. A1c today 6.2%. Remains on metformin. All medications reviewed. Compliant with all. No chest pains. No dyspnea. No falls.  Past Medical History:  Diagnosis Date  . CELLULITIS, RIGHT LEG 04/22/2009  . DM w/o complication type II 2/75/1700  . ECZEMA 05/08/2008  . HYPERLIPIDEMIA 05/08/2008  . HYPOTHYROIDISM 05/08/2008  . KNEE, ARTHRITIS, DEGEN./OSTEO 03/25/2009  . SUPERFICIAL PHLEBITIS 12/27/2008  . Unspecified essential hypertension 05/08/2008   Past Surgical History:  Procedure Laterality Date  . CATARACT EXTRACTION  2005   bilateral  . CHOLECYSTECTOMY    . COLONOSCOPY  04-15-2004   tics  . KNEE SURGERY     TKR Dr Alma Friendly 2007  . nasal polyps    . TONSILLECTOMY  1946  . WRIST SURGERY  2015   right side    reports that he has never smoked. He has never used smokeless tobacco. He reports that he does not drink alcohol or use drugs. family history includes Arthritis in his other; Heart disease in his other; Hypertension in his other. No Known Allergies     Review of Systems  Constitutional: Negative for fatigue.  Eyes: Negative for visual disturbance.  Respiratory: Negative for cough, chest tightness and shortness of breath.   Cardiovascular: Negative for chest pain, palpitations and leg swelling.  Endocrine: Negative for polydipsia and polyuria.  Neurological: Negative for dizziness, syncope, weakness, light-headedness and headaches.        Objective:   Physical Exam  Constitutional: He appears well-developed and well-nourished.  HENT:  Right Ear: External ear normal.  Left Ear: External ear normal.  Mouth/Throat: Oropharynx is clear and moist.  Eyes: Pupils are equal, round, and reactive to light.  Neck: Neck supple. No thyromegaly present.  Cardiovascular: Normal rate and regular rhythm.   Pulmonary/Chest: Effort normal and breath sounds normal. No respiratory distress. He has no wheezes. He has no rales.  Musculoskeletal: He exhibits no edema.  Neurological: He is alert.       Assessment:     #1 hypertension stable and ankle  #2 type 2 diabetes well controlled with A1c today 6.2%  #3 dyslipidemia  #4 concern for possible short-term memory deficit.  MMSE 27/30    Plan:     -Reduce metformin to 500 mg 1 twice daily -Check further labs with basic metabolic panel, lipid panel, hepatic panel -Check MMSE -Return in one month with his wife to discuss cognitive issues and also to screen further for depression with PHQ9  [name] MD [facility] at [facility]

## 2016-11-04 DIAGNOSIS — R69 Illness, unspecified: Secondary | ICD-10-CM | POA: Diagnosis not present

## 2016-11-12 ENCOUNTER — Encounter: Payer: Self-pay | Admitting: Family Medicine

## 2016-11-14 ENCOUNTER — Other Ambulatory Visit: Payer: Self-pay | Admitting: Family Medicine

## 2016-11-27 ENCOUNTER — Encounter: Payer: Self-pay | Admitting: Family Medicine

## 2016-11-27 ENCOUNTER — Ambulatory Visit (INDEPENDENT_AMBULATORY_CARE_PROVIDER_SITE_OTHER): Payer: Medicare HMO | Admitting: Family Medicine

## 2016-11-27 DIAGNOSIS — R4189 Other symptoms and signs involving cognitive functions and awareness: Secondary | ICD-10-CM | POA: Diagnosis not present

## 2016-11-27 MED ORDER — DONEPEZIL HCL 10 MG PO TABS: 10.0000 mg | ORAL_TABLET | Freq: Every day | ORAL | 6 refills | Status: DC

## 2016-11-27 MED ORDER — DONEPEZIL HCL 5 MG PO TABS: 5.0000 mg | ORAL_TABLET | Freq: Every day | ORAL | 0 refills | Status: DC

## 2016-11-27 NOTE — Progress Notes (Signed)
Subjective:     Patient ID: [name], male   DOB: Feb 26, 1935, 81 y.o.   MRN: [id number]  HPI Patient is seen with wife to discuss recent cognitive decline issues. He scored 27/30 on Mini-Mental status exam. We had him come back today to screen for depression and pH Q-9 score of 2. Wife states that he is forgetting things like TV channels, names of people, days of week. He even forgot that he had seen our nurse today from last visit. He has definite impairment of short-term memory. Still drives short distances.  He is on thyroid replacement and had B12 level of 574 back in February.  Past Medical History:  Diagnosis Date  . CELLULITIS, RIGHT LEG 04/22/2009  . DM w/o complication type II 0/11/2723  . ECZEMA 05/08/2008  . HYPERLIPIDEMIA 05/08/2008  . HYPOTHYROIDISM 05/08/2008  . KNEE, ARTHRITIS, DEGEN./OSTEO 03/25/2009  . SUPERFICIAL PHLEBITIS 12/27/2008  . Unspecified essential hypertension 05/08/2008   Past Surgical History:  Procedure Laterality Date  . CATARACT EXTRACTION  2005   bilateral  . CHOLECYSTECTOMY    . COLONOSCOPY  04-15-2004   tics  . KNEE SURGERY     TKR Dr Alma Friendly 2007  . nasal polyps    . TONSILLECTOMY  1946  . WRIST SURGERY  2015   right side    reports that he has never smoked. He has never used smokeless tobacco. He reports that he does not drink alcohol or use drugs. family history includes Arthritis in his other; Heart disease in his other; Hypertension in his other. No Known Allergies   Review of Systems  Constitutional: Negative for fatigue.  Eyes: Negative for visual disturbance.  Respiratory: Negative for cough, chest tightness and shortness of breath.   Cardiovascular: Negative for chest pain, palpitations and leg swelling.  Neurological: Negative for dizziness, syncope, weakness, light-headedness and headaches.       Objective:   Physical Exam  Constitutional: He is oriented to person, place, and time. He appears well-developed and  well-nourished.  HENT:  Right Ear: External ear normal.  Left Ear: External ear normal.  Mouth/Throat: Oropharynx is clear and moist.  Eyes: Pupils are equal, round, and reactive to light.  Neck: Neck supple. No thyromegaly present.  Cardiovascular: Normal rate and regular rhythm.   Pulmonary/Chest: Effort normal and breath sounds normal. No respiratory distress. He has no wheezes. He has no rales.  Musculoskeletal: He exhibits no edema.  Neurological: He is alert and oriented to person, place, and time.       Assessment:     Cognitive decline. No evidence for major depressive disorder.    Plan:     -Recommended trial of Aricept 5 mg daily at bedtime for one month and increased to 10 mg daily and reassess here in 3 months  [name] MD [facility] at [facility]

## 2016-11-27 NOTE — Patient Instructions (Signed)
Start the Aricept at 5 mg one at night After one month increase to 10 mg

## 2016-12-15 DIAGNOSIS — E1151 Type 2 diabetes mellitus with diabetic peripheral angiopathy without gangrene: Secondary | ICD-10-CM | POA: Diagnosis not present

## 2016-12-15 DIAGNOSIS — L84 Corns and callosities: Secondary | ICD-10-CM | POA: Diagnosis not present

## 2016-12-15 DIAGNOSIS — B351 Tinea unguium: Secondary | ICD-10-CM | POA: Diagnosis not present

## 2016-12-15 DIAGNOSIS — M79676 Pain in unspecified toe(s): Secondary | ICD-10-CM | POA: Diagnosis not present

## 2016-12-16 ENCOUNTER — Other Ambulatory Visit: Payer: Self-pay | Admitting: Family Medicine

## 2016-12-21 ENCOUNTER — Other Ambulatory Visit: Payer: Self-pay | Admitting: Family Medicine

## 2017-01-13 ENCOUNTER — Ambulatory Visit: Payer: Medicare HMO | Admitting: Family Medicine

## 2017-01-13 ENCOUNTER — Encounter: Payer: Self-pay | Admitting: Family Medicine

## 2017-01-13 VITALS — BP 110/74 | HR 72 | Temp 97.8°F | Wt 194.3 lb

## 2017-01-13 DIAGNOSIS — E119 Type 2 diabetes mellitus without complications: Secondary | ICD-10-CM | POA: Diagnosis not present

## 2017-01-13 DIAGNOSIS — R300 Dysuria: Secondary | ICD-10-CM

## 2017-01-13 DIAGNOSIS — R3915 Urgency of urination: Secondary | ICD-10-CM

## 2017-01-13 LAB — POCT URINALYSIS DIPSTICK
Bilirubin, UA: NEGATIVE
Blood, UA: NEGATIVE
GLUCOSE UA: NEGATIVE
Ketones, UA: NEGATIVE
LEUKOCYTES UA: NEGATIVE
Nitrite, UA: NEGATIVE
PROTEIN UA: NEGATIVE
Spec Grav, UA: 1.015 (ref 1.010–1.025)
UROBILINOGEN UA: 0.2 U/dL
pH, UA: 6 (ref 5.0–8.0)

## 2017-01-13 MED ORDER — MIRABEGRON ER 25 MG PO TB24: 25.0000 mg | ORAL_TABLET | Freq: Every day | ORAL | 5 refills | Status: DC

## 2017-01-13 NOTE — Progress Notes (Signed)
Subjective:     Patient ID: [name], male   DOB: 1936-01-13, 81 y.o.   MRN: [id number]  HPI Patient has multiple medical problems and is seen today with concerns for some urine urgency over the past 2-3 weeks. He denies any slow stream or significant nocturia. His symptoms are worse during the day. He only drinks about 1 cup of coffee per day and no other caffeine sources. No alcohol. No clear exacerbating factors. He feels he is able to empty his bladder fully but when he gets signal to go to the bathroom he has a difficult time making it. No actual incontinence. He had some mild burning couple weeks ago but none currently. No fevers or chills  He does have type 2 diabetes but this has been fairly well controlled.  No dry mouth.  Recent A1C in September 6.2%.  Past Medical History:  Diagnosis Date  . CELLULITIS, RIGHT LEG 04/22/2009  . DM w/o complication type II 3/55/7322  . ECZEMA 05/08/2008  . HYPERLIPIDEMIA 05/08/2008  . HYPOTHYROIDISM 05/08/2008  . KNEE, ARTHRITIS, DEGEN./OSTEO 03/25/2009  . SUPERFICIAL PHLEBITIS 12/27/2008  . Unspecified essential hypertension 05/08/2008   Past Surgical History:  Procedure Laterality Date  . CATARACT EXTRACTION  2005   bilateral  . CHOLECYSTECTOMY    . COLONOSCOPY  04-15-2004   tics  . KNEE SURGERY     TKR Dr Alma Friendly 2007  . nasal polyps    . TONSILLECTOMY  1946  . WRIST SURGERY  2015   right side    reports that  has never smoked. he has never used smokeless tobacco. He reports that he does not drink alcohol or use drugs. family history includes Arthritis in his other; Heart disease in his other; Hypertension in his other. No Known Allergies   Review of Systems  Constitutional: Negative for chills and fever.  Respiratory: Negative for shortness of breath.   Gastrointestinal: Negative for abdominal pain.  Endocrine: Negative for polydipsia.  Genitourinary: Positive for frequency and urgency. Negative for decreased urine volume,  difficulty urinating and hematuria.  Musculoskeletal: Negative for back pain.       Objective:   Physical Exam  Constitutional: He appears well-developed and well-nourished.  Cardiovascular: Normal rate and regular rhythm.  Pulmonary/Chest: Effort normal and breath sounds normal. No respiratory distress. He has no wheezes. He has no rales.  Neurological: He is alert.       Assessment:     #1 Urine urgency. Urine dipstick is negative. He is not describing obstructive urinary symptoms  #2 type 2 diabetes well controlled.      Plan:     -keep caffeine intake low -Consider trial of Myrbetriq 25 mg once daily -He has follow-up in January and reassess then  [name] MD [facility] at [facility]

## 2017-01-13 NOTE — Patient Instructions (Signed)
Keep caffeine intake low Avoid drinking within 2-3 hours of bedtime.

## 2017-02-05 LAB — HM DIABETES EYE EXAM

## 2017-02-18 ENCOUNTER — Encounter: Payer: Self-pay | Admitting: Family Medicine

## 2017-02-24 ENCOUNTER — Other Ambulatory Visit: Payer: Self-pay | Admitting: *Deleted

## 2017-02-24 ENCOUNTER — Telehealth: Payer: Self-pay | Admitting: Family Medicine

## 2017-02-24 MED ORDER — ACCU-CHEK FASTCLIX LANCETS MISC: 2 refills | Status: DC

## 2017-02-24 NOTE — Telephone Encounter (Signed)
Refill for lancets sent to new pharmacy

## 2017-02-24 NOTE — Telephone Encounter (Signed)
Copied from [place]. Topic: Quick Communication - Rx Refill/Question >> Feb 24, 2017  9:48 AM [place], Laban Emperor wrote: Has the patient contacted their pharmacy? No.   (Agent: If no, request that the patient contact the pharmacy for the refill.)   Preferred Pharmacy (with phone number or street name): [place], [place] [phone] (Phone) [phone] (Fax)  Pt wife Doroteo Bradford called requesting a refill on pt's [place] [[phone]]    Agent: Please be advised that RX refills may take up to 3 business days. We ask that you follow-up with your pharmacy.

## 2017-03-01 ENCOUNTER — Encounter: Payer: Self-pay | Admitting: Family Medicine

## 2017-03-01 ENCOUNTER — Ambulatory Visit: Payer: Medicare HMO | Admitting: Family Medicine

## 2017-03-01 VITALS — BP 120/80 | HR 75 | Temp 98.2°F | Wt 194.7 lb

## 2017-03-01 DIAGNOSIS — I10 Essential (primary) hypertension: Secondary | ICD-10-CM | POA: Diagnosis not present

## 2017-03-01 DIAGNOSIS — E119 Type 2 diabetes mellitus without complications: Secondary | ICD-10-CM | POA: Diagnosis not present

## 2017-03-01 DIAGNOSIS — R4189 Other symptoms and signs involving cognitive functions and awareness: Secondary | ICD-10-CM | POA: Diagnosis not present

## 2017-03-01 LAB — POCT GLYCOSYLATED HEMOGLOBIN (HGB A1C): HEMOGLOBIN A1C: 6.2

## 2017-03-01 MED ORDER — MIRABEGRON ER 25 MG PO TB24: 25.0000 mg | ORAL_TABLET | Freq: Every day | ORAL | 11 refills | Status: DC

## 2017-03-01 NOTE — Progress Notes (Signed)
Subjective:     Patient ID: [name], male   DOB: 1935/06/20, 82 y.o.   MRN: [id number]  HPI Patient seen for medical follow-up. He has chronic problems including type 2 diabetes, hyperlipidemia, hypothyroidism, B12 deficiency, hypertension, mild cognitive decline.  We started Aricept last fall and patient and his wife thinks he has had some improvement in memory since then. Currently on Aricept 10 mg daily.  Type 2 diabetes which has been very stable. Controlled with metformin. Still checks blood sugars daily fasting blood sugars These have been very stable.  Ongoing problems of urinary urgency. We started Myrbetriq which helps somewhat but still has frequent nocturia occasionally Limit his coffee intake to 1 cup per morning. No alcohol use.  Health maintenance up-to-date. Flu vaccine already given. Just had exam few weeks ago.  Past Medical History:  Diagnosis Date  . CELLULITIS, RIGHT LEG 04/22/2009  . DM w/o complication type II 6/38/9373  . ECZEMA 05/08/2008  . HYPERLIPIDEMIA 05/08/2008  . HYPOTHYROIDISM 05/08/2008  . KNEE, ARTHRITIS, DEGEN./OSTEO 03/25/2009  . SUPERFICIAL PHLEBITIS 12/27/2008  . Unspecified essential hypertension 05/08/2008   Past Surgical History:  Procedure Laterality Date  . CATARACT EXTRACTION  2005   bilateral  . CHOLECYSTECTOMY    . COLONOSCOPY  04-15-2004   tics  . KNEE SURGERY     TKR Dr Alma Friendly 2007  . nasal polyps    . TONSILLECTOMY  1946  . WRIST SURGERY  2015   right side    reports that  has never smoked. he has never used smokeless tobacco. He reports that he does not drink alcohol or use drugs. family history includes Arthritis in his other; Heart disease in his other; Hypertension in his other. No Known Allergies   Review of Systems  Constitutional: Negative for fatigue and unexpected weight change.  Eyes: Negative for visual disturbance.  Respiratory: Negative for cough, chest tightness and shortness of breath.   Cardiovascular:  Negative for chest pain, palpitations and leg swelling.  Gastrointestinal: Negative for abdominal pain.  Endocrine: Negative for polydipsia and polyuria.  Genitourinary: Positive for urgency. Negative for decreased urine volume and dysuria.  Neurological: Negative for dizziness, syncope, weakness, light-headedness and headaches.  Psychiatric/Behavioral: Negative for agitation and dysphoric mood.       Objective:   Physical Exam  Constitutional: He is oriented to person, place, and time. He appears well-developed and well-nourished.  HENT:  Right Ear: External ear normal.  Left Ear: External ear normal.  Mouth/Throat: Oropharynx is clear and moist.  Eyes: Pupils are equal, round, and reactive to light.  Neck: Neck supple. No thyromegaly present.  Cardiovascular: Normal rate and regular rhythm.  Pulmonary/Chest: Effort normal and breath sounds normal. No respiratory distress. He has no wheezes. He has no rales.  Musculoskeletal: He exhibits no edema.  Lymphadenopathy:    He has no cervical adenopathy.  Neurological: He is alert and oriented to person, place, and time.       Assessment:     #1 hypertension stable and at goal  #2 type 2 diabetes well-controlled A1c 6.2%  #3 mild cognitive decline. He does have history of B12 deficiency but is on replacement-and recent B12 level over 500    Plan:     -Continue current medications -Refilled Myrbetriq for one year -Routine follow-up in 6 months. Repeat Mini-Mental status exam at that time  [name] MD [facility] at [facility]

## 2017-03-02 DIAGNOSIS — E1151 Type 2 diabetes mellitus with diabetic peripheral angiopathy without gangrene: Secondary | ICD-10-CM | POA: Diagnosis not present

## 2017-03-02 DIAGNOSIS — L84 Corns and callosities: Secondary | ICD-10-CM | POA: Diagnosis not present

## 2017-03-02 DIAGNOSIS — B351 Tinea unguium: Secondary | ICD-10-CM | POA: Diagnosis not present

## 2017-03-02 DIAGNOSIS — M79676 Pain in unspecified toe(s): Secondary | ICD-10-CM | POA: Diagnosis not present

## 2017-04-04 ENCOUNTER — Encounter: Payer: Self-pay | Admitting: Family Medicine

## 2017-04-06 MED ORDER — SOLIFENACIN SUCCINATE 5 MG PO TABS: 5.0000 mg | ORAL_TABLET | Freq: Every day | ORAL | 3 refills | Status: DC

## 2017-04-09 ENCOUNTER — Telehealth: Payer: Self-pay | Admitting: *Deleted

## 2017-04-09 NOTE — Telephone Encounter (Signed)
let's try Toviaz 4 mg by mouth once daily

## 2017-04-09 NOTE — Telephone Encounter (Signed)
Patient picked up a prescription for Vesicare.  It cost $400. Patient's insurance has sent a note stating these are covered or we can do a prior auth: Toviaz or Oxybutynin.

## 2017-04-12 NOTE — Telephone Encounter (Signed)
Left detailed message on machine for patient to see if he would like a prescription of Toviaz sent to his pharmacy. CRM created

## 2017-04-13 MED ORDER — FESOTERODINE FUMARATE ER 4 MG PO TB24: 4.0000 mg | ORAL_TABLET | Freq: Every day | ORAL | 0 refills | Status: DC

## 2017-04-13 NOTE — Telephone Encounter (Signed)
Copied from [place] [phone]. Topic: Quick Communication - See Telephone Encounter >> Apr 13, 2017  1:22 PM Darl Householder, RMA wrote: Pt called and confirmed that he would like for rx for toviaz sent to his pharmacy, [place]

## 2017-04-13 NOTE — Telephone Encounter (Signed)
Rx sent 

## 2017-04-22 DIAGNOSIS — Z0100 Encounter for examination of eyes and vision without abnormal findings: Secondary | ICD-10-CM | POA: Diagnosis not present

## 2017-04-28 ENCOUNTER — Other Ambulatory Visit: Payer: Self-pay | Admitting: Family Medicine

## 2017-05-11 DIAGNOSIS — B351 Tinea unguium: Secondary | ICD-10-CM | POA: Diagnosis not present

## 2017-05-11 DIAGNOSIS — E1151 Type 2 diabetes mellitus with diabetic peripheral angiopathy without gangrene: Secondary | ICD-10-CM | POA: Diagnosis not present

## 2017-05-11 DIAGNOSIS — M79676 Pain in unspecified toe(s): Secondary | ICD-10-CM | POA: Diagnosis not present

## 2017-05-11 DIAGNOSIS — L84 Corns and callosities: Secondary | ICD-10-CM | POA: Diagnosis not present

## 2017-05-20 ENCOUNTER — Other Ambulatory Visit: Payer: Self-pay | Admitting: Family Medicine

## 2017-05-20 NOTE — Telephone Encounter (Signed)
Routed to wrong department, please route accordingly  °

## 2017-05-20 NOTE — Telephone Encounter (Addendum)
Will route to office for final disposition; pt of Dr Elease Hashimoto.

## 2017-05-20 NOTE — Telephone Encounter (Signed)
Copied from [place]. Topic: Quick Communication - Rx Refill/Question >> May 20, 2017  1:28 PM [name] wrote: Medication: Accu Check aviva solution (this is solution used in the meter) Has the patient contacted their pharmacy? Yes.   (Agent: If no, request that the patient contact the pharmacy for the refill.) Preferred Pharmacy (with phone number or street name): [facility]  Agent: Please be advised that RX refills may take up to 3 business days. We ask that you follow-up with your pharmacy.

## 2017-05-21 MED ORDER — ACCU-CHEK AVIVA VI SOLN
0 refills | Status: DC
Start: 2017-05-21 — End: 2018-06-07

## 2017-05-21 NOTE — Addendum Note (Signed)
Addended by: Agnes Lawrence on: 05/21/2017 05:07 PM   Modules accepted: Orders

## 2017-05-21 NOTE — Telephone Encounter (Signed)
I called the pt and spoke with his wife.  Mrs Kydd read the name of the vial the pt is requesting and this was sent to [facility] per the pts request.

## 2017-05-26 ENCOUNTER — Other Ambulatory Visit: Payer: Self-pay | Admitting: Family Medicine

## 2017-05-27 ENCOUNTER — Other Ambulatory Visit: Payer: Self-pay | Admitting: Family Medicine

## 2017-07-09 ENCOUNTER — Other Ambulatory Visit: Payer: Self-pay | Admitting: Family Medicine

## 2017-07-13 ENCOUNTER — Encounter: Payer: Self-pay | Admitting: Family Medicine

## 2017-07-14 ENCOUNTER — Encounter: Payer: Self-pay | Admitting: Family Medicine

## 2017-07-14 ENCOUNTER — Telehealth: Payer: Self-pay | Admitting: Family Medicine

## 2017-07-14 ENCOUNTER — Ambulatory Visit (INDEPENDENT_AMBULATORY_CARE_PROVIDER_SITE_OTHER): Payer: Medicare HMO | Admitting: Family Medicine

## 2017-07-14 ENCOUNTER — Ambulatory Visit (INDEPENDENT_AMBULATORY_CARE_PROVIDER_SITE_OTHER): Payer: Medicare HMO

## 2017-07-14 VITALS — BP 110/80 | HR 56 | Temp 97.4°F | Wt 194.0 lb

## 2017-07-14 DIAGNOSIS — M898X1 Other specified disorders of bone, shoulder: Secondary | ICD-10-CM

## 2017-07-14 DIAGNOSIS — I10 Essential (primary) hypertension: Secondary | ICD-10-CM | POA: Diagnosis not present

## 2017-07-14 DIAGNOSIS — R918 Other nonspecific abnormal finding of lung field: Secondary | ICD-10-CM | POA: Diagnosis not present

## 2017-07-14 MED ORDER — TRAMADOL HCL 50 MG PO TABS: 50.0000 mg | ORAL_TABLET | Freq: Four times a day (QID) | ORAL | 0 refills | Status: DC | PRN

## 2017-07-14 NOTE — Telephone Encounter (Signed)
Returned call. No answer and no vm.   Message routed to Dr. Elease Hashimoto.   CXR results are in chart.

## 2017-07-14 NOTE — Telephone Encounter (Signed)
Pt has been notified. I will be ordering CT chest to further evaluate.
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
## 2017-07-14 NOTE — Telephone Encounter (Unsigned)
Copied from [place] [phone]. Topic: Quick Communication - See Telephone Encounter >> Jul 14, 2017  5:04 PM Neva Seat wrote: [facility] [phone]  Needing to give chest Xray results. Please call Olivia Mackie to get results.

## 2017-07-14 NOTE — Patient Instructions (Signed)
Go for the CXR as discussed  Try some icing for any recurrent pain  May use the Tramadol if pain not controlled with the Tylenol  Also consider trying topical ointment such as Biofreeze

## 2017-07-15 NOTE — Addendum Note (Signed)
Addended by: [name] on: 07/15/2017 01:15 PM   Modules accepted: Orders

## 2017-07-20 DIAGNOSIS — E1151 Type 2 diabetes mellitus with diabetic peripheral angiopathy without gangrene: Secondary | ICD-10-CM | POA: Diagnosis not present

## 2017-07-20 DIAGNOSIS — B351 Tinea unguium: Secondary | ICD-10-CM | POA: Diagnosis not present

## 2017-07-20 DIAGNOSIS — L84 Corns and callosities: Secondary | ICD-10-CM | POA: Diagnosis not present

## 2017-07-20 DIAGNOSIS — M79676 Pain in unspecified toe(s): Secondary | ICD-10-CM | POA: Diagnosis not present

## 2017-07-23 ENCOUNTER — Encounter: Payer: Self-pay | Admitting: Family Medicine

## 2017-07-23 ENCOUNTER — Ambulatory Visit: Payer: Self-pay | Admitting: *Deleted

## 2017-07-23 ENCOUNTER — Ambulatory Visit (INDEPENDENT_AMBULATORY_CARE_PROVIDER_SITE_OTHER): Payer: Medicare HMO | Admitting: Family Medicine

## 2017-07-23 VITALS — BP 140/80 | HR 75 | Temp 98.2°F | Wt 189.4 lb

## 2017-07-23 DIAGNOSIS — K5900 Constipation, unspecified: Secondary | ICD-10-CM

## 2017-07-23 DIAGNOSIS — R1110 Vomiting, unspecified: Secondary | ICD-10-CM | POA: Diagnosis not present

## 2017-07-23 LAB — BASIC METABOLIC PANEL
BUN: 17 mg/dL (ref 6–23)
CHLORIDE: 94 meq/L — AB (ref 96–112)
CO2: 30 meq/L (ref 19–32)
Calcium: 9.9 mg/dL (ref 8.4–10.5)
Creatinine, Ser: 1.19 mg/dL (ref 0.40–1.50)
GFR: 62.5 mL/min (ref >60.00)
Glucose, Bld: 141 mg/dL — ABNORMAL HIGH (ref 70–99)
POTASSIUM: 4.4 meq/L (ref 3.5–5.1)
Sodium: 133 mEq/L — ABNORMAL LOW (ref 135–145)

## 2017-07-23 LAB — CBC WITH DIFFERENTIAL/PLATELET
BASOS PCT: 0.2 % (ref 0.0–3.0)
Basophils Absolute: 0 10*3/uL (ref 0.0–0.1)
EOS ABS: 0.4 10*3/uL (ref 0.0–0.7)
Eosinophils Relative: 2.9 % (ref 0.0–5.0)
HEMATOCRIT: 41.7 % (ref 39.0–52.0)
Hemoglobin: 14.2 g/dL (ref 13.0–17.0)
LYMPHS ABS: 1.8 10*3/uL (ref 0.7–4.0)
Lymphocytes Relative: 14.3 % (ref 12.0–46.0)
MCHC: 34 g/dL (ref 30.0–36.0)
MCV: 91.6 fl (ref 78.0–100.0)
MONO ABS: 1 10*3/uL (ref 0.1–1.0)
Monocytes Relative: 7.8 % (ref 3.0–12.0)
NEUTROS ABS: 9.6 10*3/uL — AB (ref 1.4–7.7)
NEUTROS PCT: 74.8 % (ref 43.0–77.0)
PLATELETS: 267 10*3/uL (ref 150.0–400.0)
RBC: 4.55 Mil/uL (ref 4.22–5.81)
RDW: 13.5 % (ref 11.5–15.5)
WBC: 12.9 10*3/uL — ABNORMAL HIGH (ref 4.0–10.5)

## 2017-07-23 LAB — HEPATIC FUNCTION PANEL
ALT: 18 U/L (ref 0–53)
AST: 19 U/L (ref 0–37)
Albumin: 4.4 g/dL (ref 3.5–5.2)
Alkaline Phosphatase: 49 U/L (ref 39–117)
BILIRUBIN DIRECT: 0.4 mg/dL — AB (ref 0.0–0.3)
BILIRUBIN TOTAL: 2.2 mg/dL — AB (ref 0.2–1.2)
TOTAL PROTEIN: 7.2 g/dL (ref 6.0–8.3)

## 2017-07-23 LAB — LIPASE: LIPASE: 25 U/L (ref 11.0–59.0)

## 2017-07-23 MED ORDER — ONDANSETRON 4 MG PO TBDP: 4.0000 mg | ORAL_TABLET | Freq: Three times a day (TID) | ORAL | 0 refills | Status: DC | PRN

## 2017-07-23 NOTE — Telephone Encounter (Signed)
Pt called with having nausea all week and started vomiting on Wed and Thurs. No vomiting this morning. He is also constipated, normally goes very regularly. He had an enema on Wed and did not have any results from that. He wakes up feeling like he needs to vomit. He thinks it may be from taking the tramadol but he has stopped taking that med. Appointment scheduled per pt request for this morning. Will route to flow at [facility] [place].  Reason for Disposition . Taking prescription medication that could cause nausea (e.g., narcotics/opiates, antibiotics, OCPs, many others)  Answer Assessment - Initial Assessment Questions 1. NAUSEA SEVERITY: "How bad is the nausea?" (e.g., mild, moderate, severe; dehydration, weight loss)   - MILD: loss of appetite without change in eating habits   - MODERATE: decreased oral intake without significant weight loss, dehydration, or malnutrition   - SEVERE: inadequate caloric or fluid intake, significant weight loss, symptoms of dehydration     moderate 2. ONSET: "When did the nausea begin?"     The beginning of the week 3. VOMITING: "Any vomiting?" If so, ask: "How many times today?"     Yes on Wednesday, Thursday was worst 4. RECURRENT SYMPTOM: "Have you had nausea before?" If so, ask: "When was the last time?" "What happened that time?"     no 5. CAUSE: "What do you think is causing the nausea?"     Thinks maybe the medication 6. PREGNANCY: "Is there any chance you are pregnant?" (e.g., unprotected intercourse, missed birth control pill, broken condom)     n/a  Protocols used: NAUSEA-A-AH

## 2017-07-23 NOTE — Progress Notes (Signed)
  Subjective:     Patient ID: [name], male   DOB: 10/19/1935, 82 y.o.   MRN: [id number]  HPI Patient seen with constipation for the past week and about 5 episodes of vomiting over the past few days. Had some recent periscapular pains and that pain is actually slightly improved. No chest pain. No abdominal pain. He was taking some tramadol for his back pain and thinks that might have exacerbated nausea and vomiting. They held this yesterday. He's had no vomiting today. No fever. No shortness of breath.  He has had previous cholecystectomy.   Wife gave some type of enema Wednesday and he finally had bowel movement then. He is on Toviaz for overactive bladder symptoms. No burning with urination. Recent chest x-ray unremarkable. Weight is down a few pounds from last visit  Past Medical History:  Diagnosis Date  . CELLULITIS, RIGHT LEG 04/22/2009  . DM w/o complication type II 06/01/8117  . ECZEMA 05/08/2008  . HYPERLIPIDEMIA 05/08/2008  . HYPOTHYROIDISM 05/08/2008  . KNEE, ARTHRITIS, DEGEN./OSTEO 03/25/2009  . SUPERFICIAL PHLEBITIS 12/27/2008  . Unspecified essential hypertension 05/08/2008   Past Surgical History:  Procedure Laterality Date  . CATARACT EXTRACTION  2005   bilateral  . CHOLECYSTECTOMY    . COLONOSCOPY  04-15-2004   tics  . KNEE SURGERY     TKR Dr Alma Friendly 2007  . nasal polyps    . TONSILLECTOMY  1946  . WRIST SURGERY  2015   right side    reports that he has never smoked. He has never used smokeless tobacco. He reports that he does not drink alcohol or use drugs. family history includes Arthritis in his other; Heart disease in his other; Hypertension in his other. No Known Allergies   Review of Systems  Constitutional: Negative for chills and fever.  Respiratory: Negative for shortness of breath.   Cardiovascular: Negative for chest pain.  Gastrointestinal: Positive for constipation and nausea. Negative for abdominal distention, abdominal pain, blood in stool and  diarrhea.  Genitourinary: Negative for dysuria.       Objective:   Physical Exam  Constitutional: He appears well-developed and well-nourished.  HENT:  Mouth/Throat: Oropharynx is clear and moist.  Neck: Neck supple.  Cardiovascular: Normal rate and regular rhythm.  Pulmonary/Chest: Effort normal and breath sounds normal. He has no rales.  Abdominal: Soft. Bowel sounds are normal. He exhibits no distension and no mass. There is no tenderness. There is no rebound and no guarding.  Musculoskeletal: He exhibits no edema.  Lymphadenopathy:    He has no cervical adenopathy.       Assessment:     #1 recurrent vomiting past few days. Question related to recent tramadol. He does not have any abdominal pain, fever, or other concerning features. Etiology not totally clear but has kept down some fluids today  #2 constipation possibly exacerbated by Toviaz    Plan:     -Check labs with CBC, comprehensive metabolic panel, lipase -hold Toviaz and tramadol at this time -Consider hold Aricept for any recurrent nausea or vomiting -Go to ER for any recurrent vomiting or any new symptoms such as abdominal pain  [name] MD [facility] at [facility]

## 2017-07-23 NOTE — Patient Instructions (Signed)
HOLD the Tramadol and Toviaz for now.  Bland foods and plenty of fluids  Follow up for any fever, abdominal pain, or other concerns.  Consider Miralax for any further constipation.

## 2017-08-02 ENCOUNTER — Ambulatory Visit (INDEPENDENT_AMBULATORY_CARE_PROVIDER_SITE_OTHER)
Admission: RE | Admit: 2017-08-02 | Discharge: 2017-08-02 | Disposition: A | Discharge status: Home / Self Care | Payer: Medicare HMO | Source: Ambulatory Visit | Attending: Family Medicine | Admitting: Family Medicine

## 2017-08-02 DIAGNOSIS — R918 Other nonspecific abnormal finding of lung field: Secondary | ICD-10-CM | POA: Diagnosis not present

## 2017-08-31 ENCOUNTER — Ambulatory Visit (INDEPENDENT_AMBULATORY_CARE_PROVIDER_SITE_OTHER): Payer: Medicare HMO | Admitting: Family Medicine

## 2017-08-31 ENCOUNTER — Encounter: Payer: Self-pay | Admitting: Family Medicine

## 2017-08-31 VITALS — BP 130/80 | HR 87 | Temp 98.3°F | Wt 194.7 lb

## 2017-08-31 DIAGNOSIS — E785 Hyperlipidemia, unspecified: Secondary | ICD-10-CM | POA: Diagnosis not present

## 2017-08-31 DIAGNOSIS — I10 Essential (primary) hypertension: Secondary | ICD-10-CM

## 2017-08-31 DIAGNOSIS — E119 Type 2 diabetes mellitus without complications: Secondary | ICD-10-CM

## 2017-08-31 DIAGNOSIS — E538 Deficiency of other specified B group vitamins: Secondary | ICD-10-CM

## 2017-08-31 DIAGNOSIS — E039 Hypothyroidism, unspecified: Secondary | ICD-10-CM

## 2017-08-31 LAB — BASIC METABOLIC PANEL
BUN: 24 mg/dL — AB (ref 6–23)
CALCIUM: 9.9 mg/dL (ref 8.4–10.5)
CO2: 30 mEq/L (ref 19–32)
Chloride: 98 mEq/L (ref 96–112)
Creatinine, Ser: 1.43 mg/dL (ref 0.40–1.50)
GFR: 50.55 mL/min — AB (ref >60.00)
GLUCOSE: 109 mg/dL — AB (ref 70–99)
Potassium: 4.5 mEq/L (ref 3.5–5.1)
SODIUM: 135 meq/L (ref 135–145)

## 2017-08-31 LAB — HEPATIC FUNCTION PANEL
ALT: 19 U/L (ref 0–53)
AST: 20 U/L (ref 0–37)
Albumin: 4.3 g/dL (ref 3.5–5.2)
Alkaline Phosphatase: 37 U/L — ABNORMAL LOW (ref 39–117)
BILIRUBIN DIRECT: 0.3 mg/dL (ref 0.0–0.3)
TOTAL PROTEIN: 7.2 g/dL (ref 6.0–8.3)
Total Bilirubin: 1.8 mg/dL — ABNORMAL HIGH (ref 0.2–1.2)

## 2017-08-31 LAB — LIPID PANEL
CHOL/HDL RATIO: 3
Cholesterol: 108 mg/dL (ref 0–200)
HDL: 31.4 mg/dL — ABNORMAL LOW (ref >39.00)
LDL CALC: 52 mg/dL (ref 0–99)
NonHDL: 76.66
Triglycerides: 123 mg/dL (ref 0.0–149.0)
VLDL: 24.6 mg/dL (ref 0.0–40.0)

## 2017-08-31 LAB — POCT GLYCOSYLATED HEMOGLOBIN (HGB A1C): HEMOGLOBIN A1C: 6.2 % — AB (ref 4.0–5.6)

## 2017-08-31 LAB — TSH: TSH: 5.44 u[IU]/mL — ABNORMAL HIGH (ref 0.35–4.50)

## 2017-08-31 LAB — VITAMIN B12: Vitamin B-12: >1500 pg/mL — ABNORMAL HIGH (ref 211–911)

## 2017-08-31 NOTE — Progress Notes (Signed)
  Subjective:     Patient ID: [name], male   DOB: [birth year], 82 y.o.   MRN: [id number]  HPI Patient seen for medical follow-up  Has had some cognitive decline and we started him on Aricept several months ago. He is here by himself today. He is still driving and getting around fairly well. His chronic medical prongs history type 2 diabetes, hyperlipidemia, hypothyroidism, urinary urgency, hypertension.  Medications reviewed. He no longer takes [place]. Urinary symptoms stable. Blood sugars very well-controlled A1c today 6.2%. Does not monitor regularly at home. He sees podiatrist at least every 4-6 months. Is getting regular yearly checkups.  Denies any recent dizziness, headaches, or chest pain. Has history of B12 deficiency. He is on oral replacement. Has not have level in about a year and a half  Hypothyroidism on replacement. His TSH levels have been fairly stable for several years  Past Medical History:  Diagnosis Date  . CELLULITIS, RIGHT LEG 04/22/2009  . DM w/o complication type II 3/38/2505  . ECZEMA 05/08/2008  . HYPERLIPIDEMIA 05/08/2008  . HYPOTHYROIDISM 05/08/2008  . KNEE, ARTHRITIS, DEGEN./OSTEO 03/25/2009  . SUPERFICIAL PHLEBITIS 12/27/2008  . Unspecified essential hypertension 05/08/2008   Past Surgical History:  Procedure Laterality Date  . CATARACT EXTRACTION  2005   bilateral  . CHOLECYSTECTOMY    . COLONOSCOPY  04-15-2004   tics  . KNEE SURGERY     TKR Dr Alma Friendly 2007  . nasal polyps    . TONSILLECTOMY  1946  . WRIST SURGERY  2015   right side    reports that he has never smoked. He has never used smokeless tobacco. He reports that he does not drink alcohol or use drugs. family history includes Arthritis in his other; Heart disease in his other; Hypertension in his other. No Known Allergies   Review of Systems  Constitutional: Negative for fatigue.  Eyes: Negative for visual disturbance.  Respiratory: Negative for cough, chest tightness and shortness  of breath.   Cardiovascular: Negative for chest pain, palpitations and leg swelling.  Neurological: Negative for dizziness, syncope, weakness, light-headedness and headaches.       Objective:   Physical Exam  Constitutional: He is oriented to person, place, and time. He appears well-developed and well-nourished.  HENT:  Right Ear: External ear normal.  Left Ear: External ear normal.  Mouth/Throat: Oropharynx is clear and moist.  Eyes: Pupils are equal, round, and reactive to light.  Neck: Neck supple. No thyromegaly present.  Cardiovascular: Normal rate and regular rhythm.  Pulmonary/Chest: Effort normal and breath sounds normal. No respiratory distress. He has no wheezes. He has no rales.  Musculoskeletal: He exhibits no edema.  Neurological: He is alert and oriented to person, place, and time.  Skin:  He has some fungal changes in the great toenails bilaterally. He has small callus left foot MTP joint but no evidence for ulceration. Feet are warm to touch with good capillary refill. Normal sensory function with monofilament throughout       Assessment:     #1 type 2 diabetes well-controlled A1c today 6.2%  #2 hypertension stable and at goal  #3 hyperlipidemia  #4 history of B12 deficiency  #5 hypothyroidism    Plan:     -Recheck labs with lipid panel, hepatic panel, basic metabolic panel, L97 level, TSH -Continue regular eye checkups. -We'll plan routine follow-up in 6 months. Repeat MMSE at that point  Gary Post MD [facility] at [facility]

## 2017-09-12 NOTE — Progress Notes (Addendum)
Subjective:   [name] is a 82 y.o. male who presents for Medicare Annual/Subsequent preventive examination.  Sleep patterns: 7-8 hrs/night. Wakes up feeling rested. Naps during the day. Still lives in single family home Split level  Bedroom and bathroom were upstairs  Still driving some   BMI 27  Checks his bs every am, wife assist  Meds states his wife oversees  Has had both knees replaced   Loves to shoot but stopped abruptly  Does not do this anymore Was a Civil engineer, contracting by trade  Functional losses;  Mowing the yard or pays someone  Counseling:   Dental- Every 6 months.  Male:   CCS-    10/01/2014 5 year recall. 2021 PSA- per Dr. Elease Hashimoto  IMM Zoster 2012 Educated regarding shingrix   Recent Labs    Cardiac Risk Factors include: advanced age (>87men, >60 women);diabetes mellitus;dyslipidemia;family history of premature cardiovascular disease;hypertension;male genderHearing aids both ears Vision   Biannual; cataracts removed  Current Exercise Habits: The patient does not participate in regular exercise at present  Does walk with wife as she encourages him   Diet Pre-diabetic A1c 6.2  Good control  3 meals/day. At least 5 glasses water/day. 1 cup coffee. Diet coke almost every day.  Breakfast: Cereal or eggs and biscuit. Lunch: Sandwich or soup. Dinner: Sometimes eats out. Wife usually cooks meat and vegetables.           Objective:    Vitals: BP 124/80   Pulse 72   Ht 5\' 11"  (1.803 m)   Wt 195 lb (88.5 kg)   SpO2 96%   BMI 27.20 kg/m   Body mass index is 27.2 kg/m.  Advanced Directives 09/14/2017 09/14/2017 09/14/2017 09/10/2016 09/17/2014  Does Patient Have a Medical Advance Directive? Yes Yes Yes Yes Yes  Type of Advance Directive - - Public librarian;Living will [place];Living will  Does patient want to make changes to medical advance directive? - - - No - Patient declined -  Copy of [place] in Chart? - - - No - copy requested -    Tobacco Social History   Tobacco Use  Smoking Status Never Smoker  Smokeless Tobacco Never Used     Counseling given: Yes   Clinical Intake:     Past Medical History:  Diagnosis Date  . CELLULITIS, RIGHT LEG 04/22/2009  . DM w/o complication type II 9/38/1017  . ECZEMA 05/08/2008  . HYPERLIPIDEMIA 05/08/2008  . HYPOTHYROIDISM 05/08/2008  . KNEE, ARTHRITIS, DEGEN./OSTEO 03/25/2009  . SUPERFICIAL PHLEBITIS 12/27/2008  . Unspecified essential hypertension 05/08/2008   Past Surgical History:  Procedure Laterality Date  . CATARACT EXTRACTION  2005   bilateral  . CHOLECYSTECTOMY    . COLONOSCOPY  04-15-2004   tics  . KNEE SURGERY     TKR Dr Alma Friendly 2007  . nasal polyps    . TONSILLECTOMY  1946  . WRIST SURGERY  2015   right side   Family History  Problem Relation Age of Onset  . Arthritis Other   . Hypertension Other   . Heart disease Other   . Colon cancer Neg Hx    Social History   Socioeconomic History  . Marital status: Married    Spouse name: Not on file  . Number of children: Not on file  . Years of education: Not on file  . Highest education level: Not on file  Occupational History  . Not on file  Social Needs  . Financial resource strain: Not on file  . Food insecurity:    Worry: Not on file    Inability: Not on file  . Transportation needs:    Medical: Not on file    Non-medical: Not on file  Tobacco Use  . Smoking status: Never Smoker  . Smokeless tobacco: Never Used  Substance and Sexual Activity  . Alcohol use: No    Alcohol/week: 0.0 oz  . Drug use: No  . Sexual activity: Not on file  Lifestyle  . Physical activity:    Days per week: Not on file    Minutes per session: Not on file  . Stress: Not on file  Relationships  . Social connections:    Talks on phone: Not on file    Gets together: Not on file    Attends religious service: Not on file    Active member of club or  organization: Not on file    Attends meetings of clubs or organizations: Not on file    Relationship status: Not on file  Other Topics Concern  . Not on file  Social History Narrative  . Not on file    Outpatient Encounter Medications as of 09/14/2017  Medication Sig  . ACCU-CHEK AVIVA PLUS test strip CHECK BLOOD SUGAR ONE TIME DAILY  . ACCU-CHEK FASTCLIX LANCETS MISC Check Blood Sugars 2 Times Daily or As Needed. DX: E11.9  . amLODipine (NORVASC) 10 MG tablet TAKE 1 TABLET EVERY DAY  . aspirin EC 325 MG tablet Take 325 mg by mouth daily.  . benazepril (LOTENSIN) 20 MG tablet TAKE 1 TABLET EVERY DAY  . Blood Glucose Calibration (ACCU-CHEK AVIVA) SOLN Use as directed  . desoximetasone (TOPICORT) 0.25 % cream Apply 1 application topically as needed.  . donepezil (ARICEPT) 10 MG tablet TAKE 1 TABLET BY MOUTH AT BEDTIME  . hydrochlorothiazide (HYDRODIURIL) 25 MG tablet TAKE 1 TABLET EVERY DAY (Patient taking differently: TAKE half TABLET EVERY DAY)  . levothyroxine (SYNTHROID, LEVOTHROID) 112 MCG tablet TAKE 1 TABLET EVERY DAY  . metFORMIN (GLUCOPHAGE) 500 MG tablet TAKE 2 TABLETS IN THE MORNING AND TAKE 1 TABLET IN THE EVENING (Patient taking differently: TAKE 1 TABLETS IN THE MORNING AND TAKE 1 TABLET IN THE EVENING)  . Omega-3 Fatty Acids (FISH OIL) 1200 MG CAPS Take 1 capsule by mouth daily.  . Potassium 99 MG TABS Take 1 tablet by mouth daily.  . propranolol (INDERAL) 40 MG tablet TAKE 1 TABLET EVERY DAY  . simvastatin (ZOCOR) 40 MG tablet TAKE 1 TABLET AT BEDTIME   Facility-Administered Encounter Medications as of 09/14/2017  Medication  . cyanocobalamin ((VITAMIN B-12)) injection 1,000 mcg    Activities of Daily Living In your present state of health, do you have any difficulty performing the following activities: 09/14/2017  Hearing? Y  Vision? N  Difficulty concentrating or making decisions? Y  Walking or climbing stairs? N  Dressing or bathing? N  Doing errands, shopping? N    Preparing Food and eating ? N  Using the Toilet? N  In the past six months, have you accidently leaked urine? (No Data)  Comment went through testing but it is better now; dr Elease Hashimoto  Do you have problems with loss of bowel control? N  Managing your Medications? N  Managing your Finances? Y  Housekeeping or managing your Housekeeping? N  Some recent data might be hidden    Patient Care Team: [name], MD as PCP - General  Assessment:   This is a routine wellness examination for [name].  Exercise Activities and Dietary recommendations Mows the yard Still walks with wife  Current Exercise Habits: Home exercise routine, Type of exercise: walking, Time (Minutes): 30, Intensity: Mild  Goals    . Patient Stated     To try and maintain health and keep the wife happy        Fall Risk Fall Risk  09/14/2017 09/10/2016 04/28/2016 01/07/2015 01/03/2014  Falls in the past year? No No No Yes No  Comment 5 to 82 yo fell x 3  - - - -  Number falls in past yr: - - - 2 or more -  Injury with Fall? - - - No -  Risk Factor Category  - - - High Fall Risk -  Follow up - - - Falls evaluation completed -     Depression Screen PHQ 2/9 Scores 09/14/2017 11/27/2016 09/10/2016 04/28/2016  PHQ - 2 Score 0 1 0 0  PHQ- 9 Score - 2 - -   States he used to have a temper but not now He worked for the telephone company [employer]   Talks about the grand dtr he lost   Cognitive Function MMSE - [place] Exam 09/14/2017 10/28/2016  Not completed: Refused -  Orientation to time 2 3  Orientation to Place 4 5  Registration 3 3  Attention/ Calculation 5 5  Recall 0 3  Language- name 2 objects 2 2  Language- repeat 1 1  Language- follow 3 step command 3 3  Language- read & follow direction 1 1  Write a sentence 1 1  Copy design 1 1  Total score 23 28        Immunization History  Administered Date(s) Administered  . Influenza Split 12/01/2010, 11/28/2012  . Influenza Whole  11/25/2009  . Influenza, High Dose Seasonal PF 10/28/2016  . Influenza,inj,Quad PF,6+ Mos 11/29/2013  . Influenza-Unspecified 11/14/2014, 10/14/2015  . Pneumococcal Conjugate-13 07/04/2014  . Pneumococcal Polysaccharide-23 02/24/2007  . Td 07/11/2009  . Zoster 07/07/2010      Screening Tests Health Maintenance  Topic Date Due  . INFLUENZA VACCINE  09/23/2017  . FOOT EXAM  10/08/2017  . OPHTHALMOLOGY EXAM  02/05/2018  . HEMOGLOBIN A1C  03/03/2018  . TETANUS/TDAP  07/12/2019  . COLONOSCOPY  10/01/2019  . PNA vac Low Risk Adult  Completed         Plan:      PCP Notes   Health Maintenance Colonoscopy due 2021 Discussed shingrix with spouse    Abnormal Screens  MMSE 23/30; some loss of orientation to time and date but could subtract serial 7s from 100. Recall 0/3 Takes instruction well Temperament is easy going today  Started aricept   Referrals  none  Patient concerns; none  Nurse Concerns; Seems stable at present  Encouraged the wife to apply for veterans benefits as they assist with HHC or ADC which may be helpful in the future. Given number tof up   Next PCP apt 03/04/2018       I have personally reviewed and noted the following in the patient's chart:   . Medical and social history . Use of alcohol, tobacco or illicit drugs  . Current medications and supplements . Functional ability and status . Nutritional status . Physical activity . Advanced directives . List of other physicians . Hospitalizations, surgeries, and ER visits in previous 12 months . Vitals . Screenings to include cognitive, depression, and  falls . Referrals and appointments  In addition, I have reviewed and discussed with patient certain preventive protocols, quality metrics, and best practice recommendations. A written personalized care plan for preventive services as well as general preventive health recommendations were provided to patient.     DHDIX,BOERQ,  RN  09/14/2017  I have reviewed the documentation for the AWV and [place] provided by the health coach and agree with their documentation. I was immediately available for any questions  [name] MD [facility] at [facility]

## 2017-09-14 ENCOUNTER — Ambulatory Visit (INDEPENDENT_AMBULATORY_CARE_PROVIDER_SITE_OTHER): Payer: Medicare HMO

## 2017-09-14 VITALS — BP 124/80 | HR 72 | Ht 71.0 in | Wt 195.0 lb

## 2017-09-14 DIAGNOSIS — Z0000 Encounter for general adult medical examination without abnormal findings: Secondary | ICD-10-CM

## 2017-09-14 NOTE — Patient Instructions (Addendum)
[name] , Thank you for taking time to come for your Medicare Wellness Visit. I appreciate your ongoing commitment to your health goals. Please review the following plan we discussed and let me know if I can assist you in the future.   Shingrix is a vaccine for the prevention of Shingles in Adults 50 and older.  If you are on Medicare, the shingrix is covered under your Part D plan, so you will take both of the vaccines in the series at your pharmacy. Please check with your benefits regarding applicable copays or out of pocket expenses.  The Shingrix is given in 2 vaccines approx 8 weeks apart. You must receive the 2nd dose prior to 6 months from receipt of the first. Please have the pharmacist print out you Immunization  dates for our office records   You may want to sign up for the [facility] if you have not They have "extra services" you may need one day  Also, you do not have to change your doctor  Veterans want to access their VA benefits can call [facility]  [address] [place], [place] [address]  Phone: [phone] Or [phone] Fax: [phone]  OR   They can to to the [facility] office and call [phone] and If the prompt starts, hit 0 and then ask for eligibility. They will assist you to sign up for medical benefits.      These are the goals we discussed: Goals    . Patient Stated     To try and maintain health and keep the wife happy        This is a list of the screening recommended for you and due dates:  Health Maintenance  Topic Date Due  . Flu Shot  09/23/2017  . Complete foot exam   10/08/2017  . Eye exam for diabetics  02/05/2018  . Hemoglobin A1C  03/03/2018  . Tetanus Vaccine  07/12/2019  . Colon Cancer Screening  10/01/2019  . Pneumonia vaccines  Completed    Fall Prevention in the Home Falls can cause injuries and can affect people from all age groups. There are many simple things that you can do to make  your home safe and to help prevent falls. What can I do on the outside of my home?  Regularly repair the edges of walkways and driveways and fix any cracks.  Remove high doorway thresholds.  Trim any shrubbery on the main path into your home.  Use bright outdoor lighting.  Clear walkways of debris and clutter, including tools and rocks.  Regularly check that handrails are securely fastened and in good repair. Both sides of any steps should have handrails.  Install guardrails along the edges of any raised decks or porches.  Have leaves, snow, and ice cleared regularly.  Use sand or salt on walkways during winter months.  In the garage, clean up any spills right away, including grease or oil spills. What can I do in the bathroom?  Use night lights.  Install grab bars by the toilet and in the tub and shower. Do not use towel bars as grab bars.  Use non-skid mats or decals on the floor of the tub or shower.  If you need to sit down while you are in the shower, use a plastic, non-slip stool.  Keep the floor dry. Immediately clean up any water that spills on the floor.  Remove soap buildup in the tub or shower on a  regular basis.  Attach bath mats securely with double-sided non-slip rug tape.  Remove throw rugs and other tripping hazards from the floor. What can I do in the bedroom?  Use night lights.  Make sure that a bedside light is easy to reach.  Do not use oversized bedding that drapes onto the floor.  Have a firm chair that has side arms to use for getting dressed.  Remove throw rugs and other tripping hazards from the floor. What can I do in the kitchen?  Clean up any spills right away.  Avoid walking on wet floors.  Place frequently used items in easy-to-reach places.  If you need to reach for something above you, use a sturdy step stool that has a grab bar.  Keep electrical cables out of the way.  Do not use floor polish or wax that makes floors  slippery. If you have to use wax, make sure that it is non-skid floor wax.  Remove throw rugs and other tripping hazards from the floor. What can I do in the stairways?  Do not leave any items on the stairs.  Make sure that there are handrails on both sides of the stairs. Fix handrails that are broken or loose. Make sure that handrails are as long as the stairways.  Check any carpeting to make sure that it is firmly attached to the stairs. Fix any carpet that is loose or worn.  Avoid having throw rugs at the top or bottom of stairways, or secure the rugs with carpet tape to prevent them from moving.  Make sure that you have a light switch at the top of the stairs and the bottom of the stairs. If you do not have them, have them installed. What are some other fall prevention tips?  Wear closed-toe shoes that fit well and support your feet. Wear shoes that have rubber soles or low heels.  When you use a stepladder, make sure that it is completely opened and that the sides are firmly locked. Have someone hold the ladder while you are using it. Do not climb a closed stepladder.  Add color or contrast paint or tape to grab bars and handrails in your home. Place contrasting color strips on the first and last steps.  Use mobility aids as needed, such as canes, walkers, scooters, and crutches.  Turn on lights if it is dark. Replace any light bulbs that burn out.  Set up furniture so that there are clear paths. Keep the furniture in the same spot.  Fix any uneven floor surfaces.  Choose a carpet design that does not hide the edge of steps of a stairway.  Be aware of any and all pets.  Review your medicines with your healthcare provider. Some medicines can cause dizziness or changes in blood pressure, which increase your risk of falling. Talk with your health care provider about other ways that you can decrease your risk of falls. This may include working with a physical therapist or trainer  to improve your strength, balance, and endurance. This information is not intended to replace advice given to you by your health care provider. Make sure you discuss any questions you have with your health care provider. Document Released: 01/30/2002 Document Revised: 07/09/2015 Document Reviewed: 03/16/2014 Elsevier Interactive Patient Education  2018 [place] Maintenance, Male A healthy lifestyle and preventive care is important for your health and wellness. Ask your health care provider about what schedule of regular examinations is right for you.  What should I know about weight and diet? Eat a Healthy Diet  Eat plenty of vegetables, fruits, whole grains, low-fat dairy products, and lean protein.  Do not eat a lot of foods high in solid fats, added sugars, or salt.  Maintain a Healthy Weight Regular exercise can help you achieve or maintain a healthy weight. You should:  Do at least 150 minutes of exercise each week. The exercise should increase your heart rate and make you sweat (moderate-intensity exercise).  Do strength-training exercises at least twice a week.  Watch Your Levels of Cholesterol and Blood Lipids  Have your blood tested for lipids and cholesterol every 5 years starting at 80 years of age. If you are at high risk for heart disease, you should start having your blood tested when you are 80 years old. You may need to have your cholesterol levels checked more often if: ? Your lipid or cholesterol levels are high. ? You are older than 81 years of age. ? You are at high risk for heart disease.  What should I know about cancer screening? Many types of cancers can be detected early and may often be prevented. Lung Cancer  You should be screened every year for lung cancer if: ? You are a current smoker who has smoked for at least 30 years. ? You are a former smoker who has quit within the past 15 years.  Talk to your health care provider about your  screening options, when you should start screening, and how often you should be screened.  Colorectal Cancer  Routine colorectal cancer screening usually begins at 80 years of age and should be repeated every 5-10 years until you are 80 years old. You may need to be screened more often if early forms of precancerous polyps or small growths are found. Your health care provider may recommend screening at an earlier age if you have risk factors for colon cancer.  Your health care provider may recommend using home test kits to check for hidden blood in the stool.  A small camera at the end of a tube can be used to examine your colon (sigmoidoscopy or colonoscopy). This checks for the earliest forms of colorectal cancer.  Prostate and Testicular Cancer  Depending on your age and overall health, your health care provider may do certain tests to screen for prostate and testicular cancer.  Talk to your health care provider about any symptoms or concerns you have about testicular or prostate cancer.  Skin Cancer  Check your skin from head to toe regularly.  Tell your health care provider about any new moles or changes in moles, especially if: ? There is a change in a mole's size, shape, or color. ? You have a mole that is larger than a pencil eraser.  Always use sunscreen. Apply sunscreen liberally and repeat throughout the day.  Protect yourself by wearing long sleeves, pants, a wide-brimmed hat, and sunglasses when outside.  What should I know about heart disease, diabetes, and high blood pressure?  If you are 54-5 years of age, have your blood pressure checked every 3-5 years. If you are 53 years of age or older, have your blood pressure checked every year. You should have your blood pressure measured twice-once when you are at a hospital or clinic, and once when you are not at a hospital or clinic. Record the average of the two measurements. To check your blood pressure when you are not at  a hospital or clinic, you  can use: ? An automated blood pressure machine at a pharmacy. ? A home blood pressure monitor.  Talk to your health care provider about your target blood pressure.  If you are between 86-21 years old, ask your health care provider if you should take aspirin to prevent heart disease.  Have regular diabetes screenings by checking your fasting blood sugar level. ? If you are at a normal weight and have a low risk for diabetes, have this test once every three years after the age of 56. ? If you are overweight and have a high risk for diabetes, consider being tested at a younger age or more often.  A one-time screening for abdominal aortic aneurysm (AAA) by ultrasound is recommended for men aged 59-75 years who are current or former smokers. What should I know about preventing infection? Hepatitis B If you have a higher risk for hepatitis B, you should be screened for this virus. Talk with your health care provider to find out if you are at risk for hepatitis B infection. Hepatitis C Blood testing is recommended for:  Everyone born from [birth year] through 1963.  Anyone with known risk factors for hepatitis C.  Sexually Transmitted Diseases (STDs)  You should be screened each year for STDs including gonorrhea and chlamydia if: ? You are sexually active and are younger than 80 years of age. ? You are older than 80 years of age and your health care provider tells you that you are at risk for this type of infection. ? Your sexual activity has changed since you were last screened and you are at an increased risk for chlamydia or gonorrhea. Ask your health care provider if you are at risk.  Talk with your health care provider about whether you are at high risk of being infected with HIV. Your health care provider may recommend a prescription medicine to help prevent HIV infection.  What else can I do?  Schedule regular health, dental, and eye exams.  Stay current with  your vaccines (immunizations).  Do not use any tobacco products, such as cigarettes, chewing tobacco, and e-cigarettes. If you need help quitting, ask your health care provider.  Limit alcohol intake to no more than 2 drinks per day. One drink equals 12 ounces of beer, 5 ounces of wine, or 1 ounces of hard liquor.  Do not use street drugs.  Do not share needles.  Ask your health care provider for help if you need support or information about quitting drugs.  Tell your health care provider if you often feel depressed.  Tell your health care provider if you have ever been abused or do not feel safe at home. This information is not intended to replace advice given to you by your health care provider. Make sure you discuss any questions you have with your health care provider. Document Released: 08/08/2007 Document Revised: 10/09/2015 Document Reviewed: 11/13/2014 Elsevier Interactive Patient Education  [employer].

## 2017-09-28 DIAGNOSIS — B351 Tinea unguium: Secondary | ICD-10-CM | POA: Diagnosis not present

## 2017-09-28 DIAGNOSIS — L84 Corns and callosities: Secondary | ICD-10-CM | POA: Diagnosis not present

## 2017-09-28 DIAGNOSIS — M79676 Pain in unspecified toe(s): Secondary | ICD-10-CM | POA: Diagnosis not present

## 2017-09-28 DIAGNOSIS — E1151 Type 2 diabetes mellitus with diabetic peripheral angiopathy without gangrene: Secondary | ICD-10-CM | POA: Diagnosis not present

## 2017-10-01 ENCOUNTER — Telehealth: Payer: Self-pay | Admitting: Family Medicine

## 2017-10-01 MED ORDER — DONEPEZIL HCL 10 MG PO TABS: 10.0000 mg | ORAL_TABLET | Freq: Every day | ORAL | 5 refills | Status: DC

## 2017-10-01 NOTE — Telephone Encounter (Signed)
Copied from [place] [id number]. Topic: Quick Communication - Rx Refill/Question >> Oct 01, 2017 12:08 PM Reyne Dumas L wrote: Medication: donepezil (ARICEPT) 10 MG tablet  Has the patient contacted their pharmacy? Yes - pharmacy states they can't refill because it is listed as inactive there (Agent: If no, request that the patient contact the pharmacy for the refill.) (Agent: If yes, when and what did the pharmacy advise?)  Preferred Pharmacy (with phone number or street name): [id number] - [place] [phone] (Phone) [phone] (Fax)  Agent: Please be advised that RX refills may take up to 3 business days. We ask that you follow-up with your pharmacy.

## 2017-10-25 ENCOUNTER — Encounter: Payer: Self-pay | Admitting: Family Medicine

## 2017-10-27 ENCOUNTER — Encounter: Payer: Self-pay | Admitting: Family Medicine

## 2017-10-27 ENCOUNTER — Ambulatory Visit (INDEPENDENT_AMBULATORY_CARE_PROVIDER_SITE_OTHER): Payer: Medicare HMO | Admitting: Family Medicine

## 2017-10-27 VITALS — BP 110/68 | HR 73 | Temp 98.8°F | Wt 191.1 lb

## 2017-10-27 DIAGNOSIS — R300 Dysuria: Secondary | ICD-10-CM

## 2017-10-27 DIAGNOSIS — N183 Chronic kidney disease, stage 3 unspecified: Secondary | ICD-10-CM

## 2017-10-27 DIAGNOSIS — Z23 Encounter for immunization: Secondary | ICD-10-CM

## 2017-10-27 LAB — POCT URINALYSIS DIPSTICK
Bilirubin, UA: NEGATIVE
GLUCOSE UA: NEGATIVE
KETONES UA: NEGATIVE
Nitrite, UA: NEGATIVE
Protein, UA: NEGATIVE
Spec Grav, UA: 1.02 (ref 1.010–1.025)
Urobilinogen, UA: 0.2 E.U./dL
pH, UA: 6 (ref 5.0–8.0)

## 2017-10-27 LAB — BASIC METABOLIC PANEL
BUN: 21 mg/dL (ref 6–23)
CALCIUM: 9.9 mg/dL (ref 8.4–10.5)
CO2: 28 meq/L (ref 19–32)
CREATININE: 1.47 mg/dL (ref 0.40–1.50)
Chloride: 95 mEq/L — ABNORMAL LOW (ref 96–112)
GFR: 48.95 mL/min — ABNORMAL LOW (ref >60.00)
GLUCOSE: 121 mg/dL — AB (ref 70–99)
Potassium: 4 mEq/L (ref 3.5–5.1)
Sodium: 133 mEq/L — ABNORMAL LOW (ref 135–145)

## 2017-10-27 MED ORDER — CIPROFLOXACIN HCL 500 MG PO TABS: 500.0000 mg | ORAL_TABLET | Freq: Two times a day (BID) | ORAL | 0 refills | Status: DC

## 2017-10-27 NOTE — Addendum Note (Signed)
Addended by: Westley Hummer B on: 10/27/2017 09:30 AM   Modules accepted: Orders

## 2017-10-27 NOTE — Progress Notes (Signed)
  Subjective:     Patient ID: [name], male   DOB: 31-Aug-1935, 82 y.o.   MRN: [id number]  HPI Patient seen for the following issues  He's had couple days of some mild burning with urination. He has some chronic frequency and urgency. He takes Toviaz for that. Still has some nocturia and urgency symptoms. He denies any documented fever but wife thinks he may of had low-grade fever subjectively yesterday. No nausea or vomiting. No flank pain. No gross hematuria. One-day history of dry cough. No dyspnea.  Patient had recent basic metabolic panel and GFR was slightly down from his baseline.  Past Medical History:  Diagnosis Date  . CELLULITIS, RIGHT LEG 04/22/2009  . DM w/o complication type II 5/37/4827  . ECZEMA 05/08/2008  . HYPERLIPIDEMIA 05/08/2008  . HYPOTHYROIDISM 05/08/2008  . KNEE, ARTHRITIS, DEGEN./OSTEO 03/25/2009  . SUPERFICIAL PHLEBITIS 12/27/2008  . Unspecified essential hypertension 05/08/2008   Past Surgical History:  Procedure Laterality Date  . CATARACT EXTRACTION  2005   bilateral  . CHOLECYSTECTOMY    . COLONOSCOPY  04-15-2004   tics  . KNEE SURGERY     TKR Dr Alma Friendly 2007  . nasal polyps    . TONSILLECTOMY  1946  . WRIST SURGERY  2015   right side    reports that he has never smoked. He has never used smokeless tobacco. He reports that he does not drink alcohol or use drugs. family history includes Arthritis in his other; Heart disease in his other; Hypertension in his other. No Known Allergies   Review of Systems  Constitutional: Negative for chills and fever.  Respiratory: Negative for shortness of breath.   Gastrointestinal: Negative for nausea and vomiting.  Genitourinary: Positive for dysuria and urgency. Negative for hematuria.       Objective:   Physical Exam  Constitutional: He appears well-developed and well-nourished.  Cardiovascular: Normal rate and regular rhythm.  Pulmonary/Chest: Effort normal and breath sounds normal.        Assessment:     Patient percent with couple day history of some urine frequency and burning with urination. Urine dipstick reveals leukocytes and blood. Rule out acute cystitis. Patient nontoxic in appearance    Plan:     -urine culture sent -Recheck basic metabolic panel -Stay well-hydrated -Start Cipro 500 mg twice a day for 10 days -Flu vaccine given  [name] MD [facility]

## 2017-10-27 NOTE — Patient Instructions (Signed)
Urinary Tract Infection, Adult  A urinary tract infection (UTI) is an infection of any part of the urinary tract, which includes the kidneys, ureters, bladder, and urethra. These organs make, store, and get rid of urine in the body. UTI can be a bladder infection (cystitis) or kidney infection (pyelonephritis).  What are the causes?  This infection may be caused by fungi, viruses, or bacteria. Bacteria are the most common cause of UTIs. This condition can also be caused by repeated incomplete emptying of the bladder during urination.  What increases the risk?  This condition is more likely to develop if:   You ignore your need to urinate or hold urine for long periods of time.   You do not empty your bladder completely during urination.   You wipe back to front after urinating or having a bowel movement, if you are male.   You are uncircumcised, if you are male.   You are constipated.   You have a urinary catheter that stays in place (indwelling).   You have a weak defense (immune) system.   You have a medical condition that affects your bowels, kidneys, or bladder.   You have diabetes.   You take antibiotic medicines frequently or for long periods of time, and the antibiotics no longer work well against certain types of infections (antibiotic resistance).   You take medicines that irritate your urinary tract.   You are exposed to chemicals that irritate your urinary tract.   You are male.    What are the signs or symptoms?  Symptoms of this condition include:   Fever.   Frequent urination or passing small amounts of urine frequently.   Needing to urinate urgently.   Pain or burning with urination.   Urine that smells bad or unusual.   Cloudy urine.   Pain in the lower abdomen or back.   Trouble urinating.   Blood in the urine.   Vomiting or being less hungry than normal.   Diarrhea or abdominal pain.   Vaginal discharge, if you are male.    How is this diagnosed?  This condition is  diagnosed with a medical history and physical exam. You will also need to provide a urine sample to test your urine. Other tests may be done, including:   Blood tests.   Sexually transmitted disease (STD) testing.    If you have had more than one UTI, a cystoscopy or imaging studies may be done to determine the cause of the infections.  How is this treated?  Treatment for this condition often includes a combination of two or more of the following:   Antibiotic medicine.   Other medicines to treat less common causes of UTI.   Over-the-counter medicines to treat pain.   Drinking enough water to stay hydrated.    Follow these instructions at home:   Take over-the-counter and prescription medicines only as told by your health care provider.   If you were prescribed an antibiotic, take it as told by your health care provider. Do not stop taking the antibiotic even if you start to feel better.   Avoid alcohol, caffeine, tea, and carbonated beverages. They can irritate your bladder.   Drink enough fluid to keep your urine clear or pale yellow.   Keep all follow-up visits as told by your health care provider. This is important.   Make sure to:  ? Empty your bladder often and completely. Do not hold urine for long periods of time.  ?   Empty your bladder before and after sex.  ? Wipe from front to back after a bowel movement if you are male. Use each tissue one time when you wipe.  Contact a health care provider if:   You have back pain.   You have a fever.   You feel nauseous or vomit.   Your symptoms do not get better after 3 days.   Your symptoms go away and then return.  Get help right away if:   You have severe back pain or lower abdominal pain.   You are vomiting and cannot keep down any medicines or water.  This information is not intended to replace advice given to you by your health care provider. Make sure you discuss any questions you have with your health care provider.  Document Released:  11/19/2004 Document Revised: 07/24/2015 Document Reviewed: 12/31/2014  Elsevier Interactive Patient Education  2018 [employer] Inc[employer]

## 2017-10-28 LAB — URINE CULTURE
MICRO NUMBER:: 91056439
Result:: NO GROWTH
SPECIMEN QUALITY: ADEQUATE

## 2017-11-25 ENCOUNTER — Other Ambulatory Visit: Payer: Self-pay | Admitting: Family Medicine

## 2017-11-30 ENCOUNTER — Encounter: Payer: Self-pay | Admitting: Family Medicine

## 2017-12-01 ENCOUNTER — Other Ambulatory Visit: Payer: Self-pay

## 2017-12-01 DIAGNOSIS — R3915 Urgency of urination: Secondary | ICD-10-CM

## 2017-12-07 DIAGNOSIS — B351 Tinea unguium: Secondary | ICD-10-CM | POA: Diagnosis not present

## 2017-12-07 DIAGNOSIS — L84 Corns and callosities: Secondary | ICD-10-CM | POA: Diagnosis not present

## 2017-12-07 DIAGNOSIS — E1151 Type 2 diabetes mellitus with diabetic peripheral angiopathy without gangrene: Secondary | ICD-10-CM | POA: Diagnosis not present

## 2017-12-07 DIAGNOSIS — M79676 Pain in unspecified toe(s): Secondary | ICD-10-CM | POA: Diagnosis not present

## 2017-12-20 ENCOUNTER — Encounter: Payer: Self-pay | Admitting: Family Medicine

## 2018-01-19 DIAGNOSIS — R3915 Urgency of urination: Secondary | ICD-10-CM | POA: Diagnosis not present

## 2018-01-19 DIAGNOSIS — N3941 Urge incontinence: Secondary | ICD-10-CM | POA: Diagnosis not present

## 2018-02-21 DIAGNOSIS — N3941 Urge incontinence: Secondary | ICD-10-CM | POA: Diagnosis not present

## 2018-02-21 DIAGNOSIS — R3915 Urgency of urination: Secondary | ICD-10-CM | POA: Diagnosis not present

## 2018-02-22 DIAGNOSIS — B351 Tinea unguium: Secondary | ICD-10-CM | POA: Diagnosis not present

## 2018-02-22 DIAGNOSIS — L84 Corns and callosities: Secondary | ICD-10-CM | POA: Diagnosis not present

## 2018-02-22 DIAGNOSIS — E1151 Type 2 diabetes mellitus with diabetic peripheral angiopathy without gangrene: Secondary | ICD-10-CM | POA: Diagnosis not present

## 2018-02-22 DIAGNOSIS — M79676 Pain in unspecified toe(s): Secondary | ICD-10-CM | POA: Diagnosis not present

## 2018-03-04 ENCOUNTER — Encounter: Payer: Self-pay | Admitting: Family Medicine

## 2018-03-04 ENCOUNTER — Ambulatory Visit (INDEPENDENT_AMBULATORY_CARE_PROVIDER_SITE_OTHER): Payer: Medicare HMO | Admitting: Family Medicine

## 2018-03-04 ENCOUNTER — Other Ambulatory Visit: Payer: Self-pay

## 2018-03-04 VITALS — BP 136/86 | HR 70 | Temp 98.1°F | Wt 194.0 lb

## 2018-03-04 DIAGNOSIS — I10 Essential (primary) hypertension: Secondary | ICD-10-CM

## 2018-03-04 DIAGNOSIS — E039 Hypothyroidism, unspecified: Secondary | ICD-10-CM | POA: Diagnosis not present

## 2018-03-04 DIAGNOSIS — E119 Type 2 diabetes mellitus without complications: Secondary | ICD-10-CM

## 2018-03-04 DIAGNOSIS — E785 Hyperlipidemia, unspecified: Secondary | ICD-10-CM

## 2018-03-04 DIAGNOSIS — N183 Chronic kidney disease, stage 3 unspecified: Secondary | ICD-10-CM | POA: Insufficient documentation

## 2018-03-04 LAB — POCT GLYCOSYLATED HEMOGLOBIN (HGB A1C): HEMOGLOBIN A1C: 6.1 % — AB (ref 4.0–5.6)

## 2018-03-04 LAB — BASIC METABOLIC PANEL
BUN: 21 mg/dL (ref 6–23)
CO2: 29 mEq/L (ref 19–32)
Calcium: 9.7 mg/dL (ref 8.4–10.5)
Chloride: 98 mEq/L (ref 96–112)
Creatinine, Ser: 1.28 mg/dL (ref 0.40–1.50)
GFR: 57.37 mL/min — AB (ref >60.00)
Glucose, Bld: 118 mg/dL — ABNORMAL HIGH (ref 70–99)
Potassium: 4.2 mEq/L (ref 3.5–5.1)
Sodium: 135 mEq/L (ref 135–145)

## 2018-03-04 LAB — TSH: TSH: 5.21 u[IU]/mL — ABNORMAL HIGH (ref 0.35–4.50)

## 2018-03-04 MED ORDER — HYDROCHLOROTHIAZIDE 12.5 MG PO TABS: 12.5000 mg | ORAL_TABLET | Freq: Every day | ORAL | 2 refills | Status: DC

## 2018-03-04 MED ORDER — BENAZEPRIL HCL 20 MG PO TABS: 20.0000 mg | ORAL_TABLET | Freq: Every day | ORAL | 2 refills | Status: DC

## 2018-03-04 MED ORDER — SIMVASTATIN 40 MG PO TABS: 40.0000 mg | ORAL_TABLET | Freq: Every day | ORAL | 2 refills | Status: DC

## 2018-03-04 MED ORDER — PROPRANOLOL HCL 40 MG PO TABS: 40.0000 mg | ORAL_TABLET | Freq: Every day | ORAL | 2 refills | Status: DC

## 2018-03-04 MED ORDER — DONEPEZIL HCL 10 MG PO TABS: 10.0000 mg | ORAL_TABLET | Freq: Every day | ORAL | 2 refills | Status: DC

## 2018-03-04 MED ORDER — LEVOTHYROXINE SODIUM 112 MCG PO TABS: 112.0000 ug | ORAL_TABLET | Freq: Every day | ORAL | 2 refills | Status: DC

## 2018-03-04 MED ORDER — AMLODIPINE BESYLATE 10 MG PO TABS: 10.0000 mg | ORAL_TABLET | Freq: Every day | ORAL | 2 refills | Status: DC

## 2018-03-04 NOTE — Progress Notes (Signed)
Subjective:     Patient ID: [name], male   DOB: 1935/06/19, 83 y.o.   MRN: [id number]  HPI Patient seen for medical follow-up.  He does have some cognitive impairment and we started Aricept last year.  He was driven here by his wife.  She does most of the driving now.  He has no specific complaints today.  His chronic problems include history of type 2 diabetes, hypothyroidism, dyslipidemia, hypertension, chronic kidney disease stage III.  He has had some problems with urinary urgency and is followed by urology for that.  Is not monitoring his blood sugars regularly.  Medications reviewed.  He does have some chronic lower extremity edema right greater than left.  No dyspnea.  No orthopnea.  Edema worse late in the day.  Past Medical History:  Diagnosis Date  . CELLULITIS, RIGHT LEG 04/22/2009  . DM w/o complication type II 2/70/3500  . ECZEMA 05/08/2008  . HYPERLIPIDEMIA 05/08/2008  . HYPOTHYROIDISM 05/08/2008  . KNEE, ARTHRITIS, DEGEN./OSTEO 03/25/2009  . SUPERFICIAL PHLEBITIS 12/27/2008  . Unspecified essential hypertension 05/08/2008   Past Surgical History:  Procedure Laterality Date  . CATARACT EXTRACTION  2005   bilateral  . CHOLECYSTECTOMY    . COLONOSCOPY  04-15-2004   tics  . KNEE SURGERY     TKR Dr Alma Friendly 2007  . nasal polyps    . TONSILLECTOMY  1946  . WRIST SURGERY  2015   right side    reports that he has never smoked. He has never used smokeless tobacco. He reports that he does not drink alcohol or use drugs. family history includes Arthritis in an other family member; Heart disease in an other family member; Hypertension in an other family member. No Known Allergies   Review of Systems  Constitutional: Negative for fatigue.  Eyes: Negative for visual disturbance.  Respiratory: Negative for cough, chest tightness and shortness of breath.   Cardiovascular: Positive for leg swelling. Negative for chest pain and palpitations.  Gastrointestinal: Negative for  abdominal pain and diarrhea.  Endocrine: Negative for polydipsia and polyuria.  Neurological: Negative for dizziness, syncope, weakness, light-headedness and headaches.  Psychiatric/Behavioral: Negative for agitation.       Objective:   Physical Exam Constitutional:      Appearance: He is well-developed.  Eyes:     Pupils: Pupils are equal, round, and reactive to light.  Neck:     Musculoskeletal: Neck supple.     Thyroid: No thyromegaly.  Cardiovascular:     Rate and Rhythm: Normal rate and regular rhythm.  Pulmonary:     Effort: Pulmonary effort is normal. No respiratory distress.     Breath sounds: Normal breath sounds. No wheezing or rales.  Musculoskeletal:     Right lower leg: Edema present.     Left lower leg: Edema present.     Comments: Trace pitting edema lower legs bilaterally  Neurological:     Mental Status: He is alert and oriented to person, place, and time.        Assessment:     #1 type 2 diabetes well-controlled with A1c 6.1%  #2 hypertension stable and at goal  #3 hypothyroidism.  His TSH was 5.44  #4 cognitive impairment  #5 bilateral leg edema.  Suspect largely venous stasis.  Probably has some diastolic dysfunction.    Plan:     -Continue current medications -Recheck TSH and basic metabolic panel -Elevate legs frequently -Consider knee-high support stockings -We will plan routine follow-up in 6 months.  Repeat MMSE or other cognitive screening at that point  [name] MD [facility]

## 2018-03-04 NOTE — Patient Instructions (Signed)
Elevate legs frequently  Consider compression socks (knee high).

## 2018-03-16 DIAGNOSIS — N3941 Urge incontinence: Secondary | ICD-10-CM | POA: Diagnosis not present

## 2018-03-16 DIAGNOSIS — R3915 Urgency of urination: Secondary | ICD-10-CM | POA: Diagnosis not present

## 2018-03-28 DIAGNOSIS — N3941 Urge incontinence: Secondary | ICD-10-CM | POA: Diagnosis not present

## 2018-03-28 DIAGNOSIS — R8271 Bacteriuria: Secondary | ICD-10-CM | POA: Diagnosis not present

## 2018-03-28 DIAGNOSIS — N311 Reflex neuropathic bladder, not elsewhere classified: Secondary | ICD-10-CM | POA: Diagnosis not present

## 2018-04-28 DIAGNOSIS — N3941 Urge incontinence: Secondary | ICD-10-CM | POA: Diagnosis not present

## 2018-05-03 DIAGNOSIS — E1151 Type 2 diabetes mellitus with diabetic peripheral angiopathy without gangrene: Secondary | ICD-10-CM | POA: Diagnosis not present

## 2018-05-03 DIAGNOSIS — L84 Corns and callosities: Secondary | ICD-10-CM | POA: Diagnosis not present

## 2018-05-03 DIAGNOSIS — B351 Tinea unguium: Secondary | ICD-10-CM | POA: Diagnosis not present

## 2018-05-03 DIAGNOSIS — M79676 Pain in unspecified toe(s): Secondary | ICD-10-CM | POA: Diagnosis not present

## 2018-05-05 DIAGNOSIS — R3915 Urgency of urination: Secondary | ICD-10-CM | POA: Diagnosis not present

## 2018-05-05 DIAGNOSIS — N3941 Urge incontinence: Secondary | ICD-10-CM | POA: Diagnosis not present

## 2018-05-12 DIAGNOSIS — R3915 Urgency of urination: Secondary | ICD-10-CM | POA: Diagnosis not present

## 2018-05-12 DIAGNOSIS — N3941 Urge incontinence: Secondary | ICD-10-CM | POA: Diagnosis not present

## 2018-05-19 DIAGNOSIS — R3915 Urgency of urination: Secondary | ICD-10-CM | POA: Diagnosis not present

## 2018-05-19 DIAGNOSIS — N3941 Urge incontinence: Secondary | ICD-10-CM | POA: Diagnosis not present

## 2018-05-26 DIAGNOSIS — R3915 Urgency of urination: Secondary | ICD-10-CM | POA: Diagnosis not present

## 2018-05-26 DIAGNOSIS — N3941 Urge incontinence: Secondary | ICD-10-CM | POA: Diagnosis not present

## 2018-06-02 DIAGNOSIS — N3941 Urge incontinence: Secondary | ICD-10-CM | POA: Diagnosis not present

## 2018-06-06 ENCOUNTER — Other Ambulatory Visit: Payer: Self-pay | Admitting: Family Medicine

## 2018-06-09 DIAGNOSIS — R3915 Urgency of urination: Secondary | ICD-10-CM | POA: Diagnosis not present

## 2018-06-09 DIAGNOSIS — N3941 Urge incontinence: Secondary | ICD-10-CM | POA: Diagnosis not present

## 2018-06-16 DIAGNOSIS — N3941 Urge incontinence: Secondary | ICD-10-CM | POA: Diagnosis not present

## 2018-06-16 DIAGNOSIS — R3915 Urgency of urination: Secondary | ICD-10-CM | POA: Diagnosis not present

## 2018-06-23 DIAGNOSIS — N3941 Urge incontinence: Secondary | ICD-10-CM | POA: Diagnosis not present

## 2018-06-30 DIAGNOSIS — N3941 Urge incontinence: Secondary | ICD-10-CM | POA: Diagnosis not present

## 2018-07-04 DIAGNOSIS — R351 Nocturia: Secondary | ICD-10-CM | POA: Diagnosis not present

## 2018-07-04 DIAGNOSIS — N3941 Urge incontinence: Secondary | ICD-10-CM | POA: Diagnosis not present

## 2018-07-04 DIAGNOSIS — N311 Reflex neuropathic bladder, not elsewhere classified: Secondary | ICD-10-CM | POA: Diagnosis not present

## 2018-07-07 DIAGNOSIS — N3941 Urge incontinence: Secondary | ICD-10-CM | POA: Diagnosis not present

## 2018-07-12 DIAGNOSIS — M79676 Pain in unspecified toe(s): Secondary | ICD-10-CM | POA: Diagnosis not present

## 2018-07-12 DIAGNOSIS — B351 Tinea unguium: Secondary | ICD-10-CM | POA: Diagnosis not present

## 2018-07-12 DIAGNOSIS — E1151 Type 2 diabetes mellitus with diabetic peripheral angiopathy without gangrene: Secondary | ICD-10-CM | POA: Diagnosis not present

## 2018-07-12 DIAGNOSIS — L84 Corns and callosities: Secondary | ICD-10-CM | POA: Diagnosis not present

## 2018-07-14 DIAGNOSIS — N3941 Urge incontinence: Secondary | ICD-10-CM | POA: Diagnosis not present

## 2018-07-22 DIAGNOSIS — N3941 Urge incontinence: Secondary | ICD-10-CM | POA: Diagnosis not present

## 2018-07-22 DIAGNOSIS — R3915 Urgency of urination: Secondary | ICD-10-CM | POA: Diagnosis not present

## 2018-08-04 DIAGNOSIS — N3941 Urge incontinence: Secondary | ICD-10-CM | POA: Diagnosis not present

## 2018-08-04 DIAGNOSIS — R3915 Urgency of urination: Secondary | ICD-10-CM | POA: Diagnosis not present

## 2018-08-25 DIAGNOSIS — R3915 Urgency of urination: Secondary | ICD-10-CM | POA: Diagnosis not present

## 2018-08-25 DIAGNOSIS — N3941 Urge incontinence: Secondary | ICD-10-CM | POA: Diagnosis not present

## 2018-09-02 ENCOUNTER — Other Ambulatory Visit: Payer: Self-pay

## 2018-09-02 ENCOUNTER — Encounter: Payer: Self-pay | Admitting: Family Medicine

## 2018-09-02 ENCOUNTER — Ambulatory Visit (INDEPENDENT_AMBULATORY_CARE_PROVIDER_SITE_OTHER): Payer: Medicare HMO | Admitting: Family Medicine

## 2018-09-02 VITALS — BP 110/72 | HR 70 | Temp 98.1°F | Ht 71.0 in | Wt 188.0 lb

## 2018-09-02 DIAGNOSIS — E039 Hypothyroidism, unspecified: Secondary | ICD-10-CM

## 2018-09-02 DIAGNOSIS — I10 Essential (primary) hypertension: Secondary | ICD-10-CM | POA: Diagnosis not present

## 2018-09-02 DIAGNOSIS — R5383 Other fatigue: Secondary | ICD-10-CM

## 2018-09-02 DIAGNOSIS — N183 Chronic kidney disease, stage 3 unspecified: Secondary | ICD-10-CM

## 2018-09-02 DIAGNOSIS — E119 Type 2 diabetes mellitus without complications: Secondary | ICD-10-CM

## 2018-09-02 DIAGNOSIS — E785 Hyperlipidemia, unspecified: Secondary | ICD-10-CM

## 2018-09-02 LAB — CBC WITH DIFFERENTIAL/PLATELET
Basophils Absolute: 0.1 10*3/uL (ref 0.0–0.1)
Basophils Relative: 0.5 % (ref 0.0–3.0)
Eosinophils Absolute: 0.3 10*3/uL (ref 0.0–0.7)
Eosinophils Relative: 3.1 % (ref 0.0–5.0)
HCT: 40.2 % (ref 39.0–52.0)
Hemoglobin: 13.4 g/dL (ref 13.0–17.0)
Lymphocytes Relative: 18.7 % (ref 12.0–46.0)
Lymphs Abs: 2 10*3/uL (ref 0.7–4.0)
MCHC: 33.3 g/dL (ref 30.0–36.0)
MCV: 92.2 fl (ref 78.0–100.0)
Monocytes Absolute: 0.9 10*3/uL (ref 0.1–1.0)
Monocytes Relative: 8.7 % (ref 3.0–12.0)
Neutro Abs: 7.5 10*3/uL (ref 1.4–7.7)
Neutrophils Relative %: 69 % (ref 43.0–77.0)
Platelets: 266 10*3/uL (ref 150.0–400.0)
RBC: 4.36 Mil/uL (ref 4.22–5.81)
RDW: 13.7 % (ref 11.5–15.5)
WBC: 10.8 10*3/uL — ABNORMAL HIGH (ref 4.0–10.5)

## 2018-09-02 LAB — LIPID PANEL
Cholesterol: 95 mg/dL (ref 0–200)
HDL: 33.4 mg/dL — ABNORMAL LOW (ref >39.00)
LDL Cholesterol: 44 mg/dL (ref 0–99)
NonHDL: 61.25
Total CHOL/HDL Ratio: 3
Triglycerides: 84 mg/dL (ref 0.0–149.0)
VLDL: 16.8 mg/dL (ref 0.0–40.0)

## 2018-09-02 LAB — POCT GLYCOSYLATED HEMOGLOBIN (HGB A1C): Hemoglobin A1C: 6.6 % — AB (ref 4.0–5.6)

## 2018-09-02 LAB — COMPREHENSIVE METABOLIC PANEL
ALT: 21 U/L (ref 0–53)
AST: 22 U/L (ref 0–37)
Albumin: 4.3 g/dL (ref 3.5–5.2)
Alkaline Phosphatase: 46 U/L (ref 39–117)
BUN: 20 mg/dL (ref 6–23)
CO2: 28 mEq/L (ref 19–32)
Calcium: 9.3 mg/dL (ref 8.4–10.5)
Chloride: 98 mEq/L (ref 96–112)
Creatinine, Ser: 1.43 mg/dL (ref 0.40–1.50)
GFR: 47.44 mL/min — ABNORMAL LOW (ref >60.00)
Glucose, Bld: 115 mg/dL — ABNORMAL HIGH (ref 70–99)
Potassium: 4.3 mEq/L (ref 3.5–5.1)
Sodium: 136 mEq/L (ref 135–145)
Total Bilirubin: 1.7 mg/dL — ABNORMAL HIGH (ref 0.2–1.2)
Total Protein: 7.1 g/dL (ref 6.0–8.3)

## 2018-09-02 LAB — TSH: TSH: 3.48 u[IU]/mL (ref 0.35–4.50)

## 2018-09-02 MED ORDER — AMLODIPINE BESYLATE 5 MG PO TABS: 5.0000 mg | ORAL_TABLET | Freq: Every day | ORAL | 3 refills | Status: DC

## 2018-09-02 NOTE — Patient Instructions (Signed)
REDUCE the Amlodipine to 5 mg daily  Stay well hydrated  Let's plan on 3 month follow up.

## 2018-09-02 NOTE — Progress Notes (Signed)
Subjective:     Patient ID: [name], male   DOB: October 28, 1935, 83 y.o.   MRN: [id number]  HPI Patient has chronic problems including cognitive impairment, hypothyroidism, hyperlipidemia, chronic kidney disease, type 2 diabetes, hypertension.  He had slightly elevated TSH several months ago and we recommended repeat at this time.  He states he has been "cold a lot "his weight is actually down a few pounds but he states he has been trying to lose and eat healthier.  Has had some decreased appetite.  Some general fatigue.  Increased daytime napping.  Much less active physically.  Denies any recent fever, chills, cough, dyspnea, chest pain, abdominal pain, headache, dysuria, or any stool changes.  Denies depression symptoms.  Past Medical History:  Diagnosis Date  . CELLULITIS, RIGHT LEG 04/22/2009  . DM w/o complication type II 7/32/2025  . ECZEMA 05/08/2008  . HYPERLIPIDEMIA 05/08/2008  . HYPOTHYROIDISM 05/08/2008  . KNEE, ARTHRITIS, DEGEN./OSTEO 03/25/2009  . SUPERFICIAL PHLEBITIS 12/27/2008  . Unspecified essential hypertension 05/08/2008   Past Surgical History:  Procedure Laterality Date  . CATARACT EXTRACTION  2005   bilateral  . CHOLECYSTECTOMY    . COLONOSCOPY  04-15-2004   tics  . KNEE SURGERY     TKR Dr Alma Friendly 2007  . nasal polyps    . TONSILLECTOMY  1946  . WRIST SURGERY  2015   right side    reports that he has never smoked. He has never used smokeless tobacco. He reports that he does not drink alcohol or use drugs. family history includes Arthritis in an other family member; Heart disease in an other family member; Hypertension in an other family member. No Known Allergies   Review of Systems  Constitutional: Positive for appetite change and fatigue. Negative for unexpected weight change.  HENT: Negative for trouble swallowing.   Eyes: Negative for visual disturbance.  Respiratory: Negative for cough, chest tightness and shortness of breath.   Cardiovascular:  Negative for chest pain, palpitations and leg swelling.  Gastrointestinal: Negative for abdominal pain, blood in stool, diarrhea, nausea and vomiting.  Endocrine: Negative for polydipsia and polyuria.  Genitourinary: Negative for dysuria.  Neurological: Negative for dizziness, syncope, light-headedness and headaches.  Hematological: Negative for adenopathy. Does not bruise/bleed easily.  Psychiatric/Behavioral: Negative for dysphoric mood.       Objective:   Physical Exam Constitutional:      Appearance: He is well-developed.  HENT:     Right Ear: External ear normal.     Left Ear: External ear normal.  Eyes:     Pupils: Pupils are equal, round, and reactive to light.  Neck:     Musculoskeletal: Neck supple.     Thyroid: No thyromegaly.  Cardiovascular:     Rate and Rhythm: Normal rate and regular rhythm.  Pulmonary:     Effort: Pulmonary effort is normal. No respiratory distress.     Breath sounds: Normal breath sounds. No wheezing or rales.  Musculoskeletal:     Right lower leg: No edema.     Left lower leg: No edema.  Neurological:     Mental Status: He is alert and oriented to person, place, and time.        Assessment:     #1 hypertension stable.  His blood pressure repeat left arm seated at rest 118/60 and standing went to 100/60  #2 type 2 diabetes well controlled A1c 6.6%  #3 hypothyroidism.  TSH was slightly elevated back in January  #4 modest weight loss.  Patient states he and his wife of actually been trying to lose a few pounds due to dietary changes.  He is also less active and he thinks his decreased appetite is related to that  #5 generalized weakness    Plan:     -Check further labs with CBC, comprehensive metabolic panel, TSH, lipids  -Reduce amlodipine to 5 mg daily.  Increase hydration  -41-month follow-up.  Repeat orthostatics then.  Consider further lowering of blood pressure medications if stable  -Consider trial of Remeron if his weight  continues to drop and especially if he is having ongoing sleep difficulties at that point  [name] MD [facility]

## 2018-09-05 DIAGNOSIS — N3941 Urge incontinence: Secondary | ICD-10-CM | POA: Diagnosis not present

## 2018-09-15 DIAGNOSIS — R3915 Urgency of urination: Secondary | ICD-10-CM | POA: Diagnosis not present

## 2018-09-15 DIAGNOSIS — N3941 Urge incontinence: Secondary | ICD-10-CM | POA: Diagnosis not present

## 2018-09-19 ENCOUNTER — Other Ambulatory Visit: Payer: Self-pay | Admitting: Family Medicine

## 2018-09-19 ENCOUNTER — Ambulatory Visit: Payer: Medicare HMO

## 2018-09-20 DIAGNOSIS — M79676 Pain in unspecified toe(s): Secondary | ICD-10-CM | POA: Diagnosis not present

## 2018-09-20 DIAGNOSIS — B351 Tinea unguium: Secondary | ICD-10-CM | POA: Diagnosis not present

## 2018-09-20 DIAGNOSIS — L84 Corns and callosities: Secondary | ICD-10-CM | POA: Diagnosis not present

## 2018-09-20 DIAGNOSIS — E1151 Type 2 diabetes mellitus with diabetic peripheral angiopathy without gangrene: Secondary | ICD-10-CM | POA: Diagnosis not present

## 2018-09-28 DIAGNOSIS — D3131 Benign neoplasm of right choroid: Secondary | ICD-10-CM | POA: Diagnosis not present

## 2018-09-28 DIAGNOSIS — H5200 Hypermetropia, unspecified eye: Secondary | ICD-10-CM | POA: Diagnosis not present

## 2018-09-28 DIAGNOSIS — Z961 Presence of intraocular lens: Secondary | ICD-10-CM | POA: Diagnosis not present

## 2018-09-28 DIAGNOSIS — H43813 Vitreous degeneration, bilateral: Secondary | ICD-10-CM | POA: Diagnosis not present

## 2018-09-28 DIAGNOSIS — Z0100 Encounter for examination of eyes and vision without abnormal findings: Secondary | ICD-10-CM | POA: Diagnosis not present

## 2018-09-28 DIAGNOSIS — I10 Essential (primary) hypertension: Secondary | ICD-10-CM | POA: Diagnosis not present

## 2018-10-06 DIAGNOSIS — R3915 Urgency of urination: Secondary | ICD-10-CM | POA: Diagnosis not present

## 2018-10-06 DIAGNOSIS — N3941 Urge incontinence: Secondary | ICD-10-CM | POA: Diagnosis not present

## 2018-10-19 DIAGNOSIS — R351 Nocturia: Secondary | ICD-10-CM | POA: Diagnosis not present

## 2018-10-19 DIAGNOSIS — N3941 Urge incontinence: Secondary | ICD-10-CM | POA: Diagnosis not present

## 2018-10-27 DIAGNOSIS — N3941 Urge incontinence: Secondary | ICD-10-CM | POA: Diagnosis not present

## 2018-10-27 DIAGNOSIS — R3915 Urgency of urination: Secondary | ICD-10-CM | POA: Diagnosis not present

## 2018-11-17 DIAGNOSIS — R3915 Urgency of urination: Secondary | ICD-10-CM | POA: Diagnosis not present

## 2018-11-17 DIAGNOSIS — N3941 Urge incontinence: Secondary | ICD-10-CM | POA: Diagnosis not present

## 2018-11-29 ENCOUNTER — Other Ambulatory Visit: Payer: Self-pay | Admitting: Family Medicine

## 2018-11-29 DIAGNOSIS — E1151 Type 2 diabetes mellitus with diabetic peripheral angiopathy without gangrene: Secondary | ICD-10-CM | POA: Diagnosis not present

## 2018-11-29 DIAGNOSIS — M79676 Pain in unspecified toe(s): Secondary | ICD-10-CM | POA: Diagnosis not present

## 2018-11-29 DIAGNOSIS — B351 Tinea unguium: Secondary | ICD-10-CM | POA: Diagnosis not present

## 2018-11-29 DIAGNOSIS — L84 Corns and callosities: Secondary | ICD-10-CM | POA: Diagnosis not present

## 2018-12-06 ENCOUNTER — Other Ambulatory Visit: Payer: Self-pay | Admitting: Family Medicine

## 2018-12-06 MED ORDER — ACCU-CHEK AVIVA PLUS VI STRP: ORAL_STRIP | 1 refills | Status: DC

## 2018-12-06 NOTE — Telephone Encounter (Signed)
Medication Refill - Medication:  ACCU-CHEK AVIVA PLUS test strip    Has the patient contacted their pharmacy? Yes pharmacy sent fax.   Preferred Pharmacy (with phone number or street name):  [place], [place] [phone] (Phone) [phone] (Fax)     Agent: Please be advised that RX refills may take up to 3 business days. We ask that you follow-up with your pharmacy.

## 2018-12-06 NOTE — Telephone Encounter (Signed)
Requested Prescriptions  Pending Prescriptions Disp Refills  . glucose blood (ACCU-CHEK AVIVA PLUS) test strip 100 strip 1    Sig: CHECK BLOOD SUGAR ONE TIME DAILY     There is no refill protocol information for this order

## 2018-12-07 ENCOUNTER — Encounter: Payer: Self-pay | Admitting: Family Medicine

## 2018-12-08 DIAGNOSIS — R3915 Urgency of urination: Secondary | ICD-10-CM | POA: Diagnosis not present

## 2018-12-08 DIAGNOSIS — N3941 Urge incontinence: Secondary | ICD-10-CM | POA: Diagnosis not present

## 2018-12-09 ENCOUNTER — Encounter: Payer: Self-pay | Admitting: Family Medicine

## 2018-12-09 ENCOUNTER — Other Ambulatory Visit: Payer: Self-pay

## 2018-12-09 ENCOUNTER — Ambulatory Visit (INDEPENDENT_AMBULATORY_CARE_PROVIDER_SITE_OTHER): Payer: Medicare HMO | Admitting: Family Medicine

## 2018-12-09 VITALS — BP 118/78 | HR 64 | Temp 98.2°F | Wt 188.1 lb

## 2018-12-09 DIAGNOSIS — E119 Type 2 diabetes mellitus without complications: Secondary | ICD-10-CM | POA: Diagnosis not present

## 2018-12-09 DIAGNOSIS — I10 Essential (primary) hypertension: Secondary | ICD-10-CM

## 2018-12-09 DIAGNOSIS — K5900 Constipation, unspecified: Secondary | ICD-10-CM

## 2018-12-09 DIAGNOSIS — F321 Major depressive disorder, single episode, moderate: Secondary | ICD-10-CM | POA: Diagnosis not present

## 2018-12-09 MED ORDER — BUPROPION HCL ER (XL) 150 MG PO TB24: 150.0000 mg | ORAL_TABLET | Freq: Every day | ORAL | 5 refills | Status: DC

## 2018-12-09 NOTE — Patient Instructions (Signed)
Major Depressive Disorder, Adult Major depressive disorder (MDD) is a mental health condition. It may also be called clinical depression or unipolar depression. MDD usually causes feelings of sadness, hopelessness, or helplessness. MDD can also cause physical symptoms. It can interfere with work, school, relationships, and other everyday activities. MDD may be mild, moderate, or severe. It may occur once (single episode major depressive disorder) or it may occur multiple times (recurrent major depressive disorder). What are the causes? The exact cause of this condition is not known. MDD is most likely caused by a combination of things, which may include:  Genetic factors. These are traits that are passed along from parent to child.  Individual factors. Your personality, your behavior, and the way you handle your thoughts and feelings may contribute to MDD. This includes personality traits and behaviors learned from others.  Physical factors, such as: ? Differences in the part of your brain that controls emotion. This part of your brain may be different than it is in people who do not have MDD. ? Long-term (chronic) medical or psychiatric illnesses.  Social factors. Traumatic experiences or major life changes may play a role in the development of MDD. What increases the risk? This condition is more likely to develop in women. The following factors may also make you more likely to develop MDD:  A family history of depression.  Troubled family relationships.  Abnormally low levels of certain brain chemicals.  Traumatic events in childhood, especially abuse or the loss of a parent.  Being under a lot of stress, or long-term stress, especially from upsetting life experiences or losses.  A history of: ? Chronic physical illness. ? Other mental health disorders. ? Substance abuse.  Poor living conditions.  Experiencing social exclusion or discrimination on a regular basis. What are the  signs or symptoms? The main symptoms of MDD typically include:  Constant depressed or irritable mood.  Loss of interest in things and activities. MDD symptoms may also include:  Sleeping or eating too much or too little.  Unexplained weight change.  Fatigue or low energy.  Feelings of worthlessness or guilt.  Difficulty thinking clearly or making decisions.  Thoughts of suicide or of harming others.  Physical agitation or weakness.  Isolation. Severe cases of MDD may also occur with other symptoms, such as:  Delusions or hallucinations, in which you imagine things that are not real (psychotic depression).  Low-level depression that lasts at least a year (chronic depression or persistent depressive disorder).  Extreme sadness and hopelessness (melancholic depression).  Trouble speaking and moving (catatonic depression). How is this diagnosed? This condition may be diagnosed based on:  Your symptoms.  Your medical history, including your mental health history. This may involve tests to evaluate your mental health. You may be asked questions about your lifestyle, including any drug and alcohol use, and how long you have had symptoms of MDD.  A physical exam.  Blood tests to rule out other conditions. You must have a depressed mood and at least four other MDD symptoms most of the day, nearly every day in the same 2-week timeframe before your health care provider can confirm a diagnosis of MDD. How is this treated? This condition is usually treated by mental health professionals, such as psychologists, psychiatrists, and clinical social workers. You may need more than one type of treatment. Treatment may include:  Psychotherapy. This is also called talk therapy or counseling. Types of psychotherapy include: ? Cognitive behavioral therapy (CBT). This type of therapy   teaches you to recognize unhealthy feelings, thoughts, and behaviors, and replace them with positive thoughts  and actions. ? Interpersonal therapy (IPT). This helps you to improve the way you relate to and communicate with others. ? Family therapy. This treatment includes members of your family.  Medicine to treat anxiety and depression, or to help you control certain emotions and behaviors.  Lifestyle changes, such as: ? Limiting alcohol and drug use. ? Exercising regularly. ? Getting plenty of sleep. ? Making healthy eating choices. ? Spending more time outdoors.  Treatments involving stimulation of the brain can be used in situations with extremely severe symptoms, or when medicine or other therapies do not work over time. These treatments include electroconvulsive therapy, transcranial magnetic stimulation, and vagal nerve stimulation. Follow these instructions at home: Activity  Return to your normal activities as told by your health care provider.  Exercise regularly and spend time outdoors as told by your health care provider. General instructions  Take over-the-counter and prescription medicines only as told by your health care provider.  Do not drink alcohol. If you drink alcohol, limit your alcohol intake to no more than 1 drink a day for nonpregnant women and 2 drinks a day for men. One drink equals 12 oz of beer, 5 oz of wine, or 1 oz of hard liquor. Alcohol can affect any antidepressant medicines you are taking. Talk to your health care provider about your alcohol use.  Eat a healthy diet and get plenty of sleep.  Find activities that you enjoy doing, and make time to do them.  Consider joining a support group. Your health care provider may be able to recommend a support group.  Keep all follow-up visits as told by your health care provider. This is important. Where to find more information National Alliance on Mental Illness  www.nami.org [place]. National Institute of Mental Health  www.nimh.nih.gov National Suicide Prevention Lifeline  1-800-273-TALK (8255). This is  free, 24-hour help. Contact a health care provider if:  Your symptoms get worse.  You develop new symptoms. Get help right away if:  You self-harm.  You have serious thoughts about hurting yourself or others.  You see, hear, taste, smell, or feel things that are not present (hallucinate). This information is not intended to replace advice given to you by your health care provider. Make sure you discuss any questions you have with your health care provider. Document Released: 06/06/2012 Document Revised: 01/22/2017 Document Reviewed: 08/21/2015 Elsevier Patient Education  2020 Elsevier Inc.  

## 2018-12-09 NOTE — Progress Notes (Signed)
Subjective:     Patient ID: [name], male   DOB: 1935-04-21, 83 y.o.   MRN: [id number]  HPI   [name] is seen for follow-up regarding some mild orthostatic type symptoms her last visit.  He also had some mild weight loss.  He had been trying to lose some weight.  His weight is stable since last visit.  We obtained several labs including TSH, CBC, comprehensive metabolic panel and these were unremarkable for any major acute changes.  We reduced his amlodipine to 5 mg daily.  He is not describing orthostatic symptoms.  He does sometimes feel "off balance ".  Denies any falls.  No syncope.  He does have some depression issues.  Low motivation and low energy.  He states he no longer has any major hobbies.  He has been more restricted in physical activities because of intolerance to doing more active things.  Denies any chest pains or dyspnea.  Sleep is fair.  Constipation issues which are likely related to oxybutynin.  He had some urgency and was seen by urology and is currently on Myrbetriq and oxybutynin.  Past Medical History:  Diagnosis Date  . CELLULITIS, RIGHT LEG 04/22/2009  . DM w/o complication type II [id number]  . ECZEMA 05/08/2008  . HYPERLIPIDEMIA 05/08/2008  . HYPOTHYROIDISM 05/08/2008  . KNEE, ARTHRITIS, DEGEN./OSTEO 03/25/2009  . SUPERFICIAL PHLEBITIS 12/27/2008  . Unspecified essential hypertension 05/08/2008   Past Surgical History:  Procedure Laterality Date  . CATARACT EXTRACTION  2005   bilateral  . CHOLECYSTECTOMY    . COLONOSCOPY  04-15-2004   tics  . KNEE SURGERY     TKR Dr Alma Friendly 2007  . nasal polyps    . TONSILLECTOMY  1946  . WRIST SURGERY  2015   right side    reports that he has never smoked. He has never used smokeless tobacco. He reports that he does not drink alcohol or use drugs. family history includes Arthritis in an other family member; Heart disease in an other family member; Hypertension in an other family member. No Known Allergies    Review  of Systems  Constitutional: Positive for fatigue. Negative for chills and fever.  Eyes: Negative for visual disturbance.  Respiratory: Negative for cough, chest tightness and shortness of breath.   Cardiovascular: Negative for chest pain, palpitations and leg swelling.  Gastrointestinal: Negative for abdominal pain.  Neurological: Negative for dizziness, seizures, syncope, weakness, light-headedness and headaches.  Psychiatric/Behavioral: Positive for dysphoric mood.       Objective:   Physical Exam Constitutional:      Appearance: Normal appearance.  Cardiovascular:     Rate and Rhythm: Normal rate and regular rhythm.  Pulmonary:     Effort: Pulmonary effort is normal.     Breath sounds: Normal breath sounds.  Neurological:     Mental Status: He is alert.  Psychiatric:     Comments: Somewhat flat affect.  PHQ-9 score of 19        Assessment:     #1 hypertension stable.  Blood pressure is better after reduction of amlodipine.  No orthostatic change.  Seated blood pressure 140/70 and standing 138/70  #2 recent mild weight loss which appears to stabilized  #3 depressed mood.  High PHQ-9 score as above.  No suicidal ideation  #4 constipation likely related oxybutynin    Plan:     -Recommend measures to reduce constipation including plenty of fluids, fiber, increased activity, stool softener if needed  -Discussed trial of Wellbutrin  XL 150 mg once daily.  No contraindications.  Reassess in 1 month.  -Strongly encouraged him to try to get engaged in hobbies and other activities  [name] MD [facility] [facility] at [facility]

## 2018-12-23 ENCOUNTER — Telehealth: Payer: Self-pay | Admitting: Family Medicine

## 2018-12-23 NOTE — Telephone Encounter (Signed)
Spoke with wife. Informed her that Dr. Elease Hashimoto is out of the office on vacation and offered to schedule with another provider. Wife spoke with patient and they both agreed to see another provider for this issue. Patient scheduled to see Dr. Sarajane Jews on Tuesday 11/3.

## 2018-12-23 NOTE — Telephone Encounter (Signed)
Patient's wife, Rhoen, Langill is calling
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
## 2018-12-27 ENCOUNTER — Encounter: Payer: Self-pay | Admitting: Family Medicine

## 2018-12-27 ENCOUNTER — Ambulatory Visit (INDEPENDENT_AMBULATORY_CARE_PROVIDER_SITE_OTHER): Payer: Medicare HMO | Admitting: Family Medicine

## 2018-12-27 ENCOUNTER — Other Ambulatory Visit: Payer: Self-pay

## 2018-12-27 VITALS — BP 120/80 | HR 76 | Temp 97.7°F | Ht 71.0 in | Wt 180.2 lb

## 2018-12-27 DIAGNOSIS — R4189 Other symptoms and signs involving cognitive functions and awareness: Secondary | ICD-10-CM

## 2018-12-27 DIAGNOSIS — E559 Vitamin D deficiency, unspecified: Secondary | ICD-10-CM | POA: Diagnosis not present

## 2018-12-27 DIAGNOSIS — N1831 Chronic kidney disease, stage 3a: Secondary | ICD-10-CM

## 2018-12-27 DIAGNOSIS — E039 Hypothyroidism, unspecified: Secondary | ICD-10-CM | POA: Diagnosis not present

## 2018-12-27 DIAGNOSIS — I10 Essential (primary) hypertension: Secondary | ICD-10-CM | POA: Diagnosis not present

## 2018-12-27 DIAGNOSIS — E119 Type 2 diabetes mellitus without complications: Secondary | ICD-10-CM | POA: Diagnosis not present

## 2018-12-27 DIAGNOSIS — E538 Deficiency of other specified B group vitamins: Secondary | ICD-10-CM | POA: Diagnosis not present

## 2018-12-27 DIAGNOSIS — G3281 Cerebellar ataxia in diseases classified elsewhere: Secondary | ICD-10-CM

## 2018-12-27 DIAGNOSIS — F339 Major depressive disorder, recurrent, unspecified: Secondary | ICD-10-CM | POA: Insufficient documentation

## 2018-12-27 DIAGNOSIS — R410 Disorientation, unspecified: Secondary | ICD-10-CM

## 2018-12-27 DIAGNOSIS — F320 Major depressive disorder, single episode, mild: Secondary | ICD-10-CM

## 2018-12-27 LAB — POCT URINALYSIS DIPSTICK
Bilirubin, UA: NEGATIVE
Blood, UA: NEGATIVE
Glucose, UA: NEGATIVE
Leukocytes, UA: NEGATIVE
Nitrite, UA: NEGATIVE
Protein, UA: POSITIVE — AB
Spec Grav, UA: 1.015 (ref 1.010–1.025)
Urobilinogen, UA: 0.2 E.U./dL
pH, UA: 7 (ref 5.0–8.0)

## 2018-12-27 LAB — T4, FREE: Free T4: 1.59 ng/dL (ref 0.60–1.60)

## 2018-12-27 LAB — BASIC METABOLIC PANEL
BUN: 16 mg/dL (ref 6–23)
CO2: 31 mEq/L (ref 19–32)
Calcium: 10.1 mg/dL (ref 8.4–10.5)
Chloride: 89 mEq/L — ABNORMAL LOW (ref 96–112)
Creatinine, Ser: 1.14 mg/dL (ref 0.40–1.50)
GFR: 61.57 mL/min (ref >60.00)
Glucose, Bld: 170 mg/dL — ABNORMAL HIGH (ref 70–99)
Potassium: 3.6 mEq/L (ref 3.5–5.1)
Sodium: 128 mEq/L — ABNORMAL LOW (ref 135–145)

## 2018-12-27 LAB — CBC WITH DIFFERENTIAL/PLATELET
Basophils Absolute: 0 10*3/uL (ref 0.0–0.1)
Basophils Relative: 0.2 % (ref 0.0–3.0)
Eosinophils Absolute: 0.2 10*3/uL (ref 0.0–0.7)
Eosinophils Relative: 2 % (ref 0.0–5.0)
HCT: 44.4 % (ref 39.0–52.0)
Hemoglobin: 14.7 g/dL (ref 13.0–17.0)
Lymphocytes Relative: 14.2 % (ref 12.0–46.0)
Lymphs Abs: 1.7 10*3/uL (ref 0.7–4.0)
MCHC: 33.2 g/dL (ref 30.0–36.0)
MCV: 91.3 fl (ref 78.0–100.0)
Monocytes Absolute: 1.1 10*3/uL — ABNORMAL HIGH (ref 0.1–1.0)
Monocytes Relative: 9.1 % (ref 3.0–12.0)
Neutro Abs: 8.7 10*3/uL — ABNORMAL HIGH (ref 1.4–7.7)
Neutrophils Relative %: 74.5 % (ref 43.0–77.0)
Platelets: 317 10*3/uL (ref 150.0–400.0)
RBC: 4.86 Mil/uL (ref 4.22–5.81)
RDW: 14 % (ref 11.5–15.5)
WBC: 11.7 10*3/uL — ABNORMAL HIGH (ref 4.0–10.5)

## 2018-12-27 LAB — VITAMIN B12: Vitamin B-12: >1500 pg/mL — ABNORMAL HIGH (ref 211–911)

## 2018-12-27 LAB — HEPATIC FUNCTION PANEL
ALT: 25 U/L (ref 0–53)
AST: 23 U/L (ref 0–37)
Albumin: 4.6 g/dL (ref 3.5–5.2)
Alkaline Phosphatase: 51 U/L (ref 39–117)
Bilirubin, Direct: 0.6 mg/dL — ABNORMAL HIGH (ref 0.0–0.3)
Total Bilirubin: 3.4 mg/dL — ABNORMAL HIGH (ref 0.2–1.2)
Total Protein: 7.4 g/dL (ref 6.0–8.3)

## 2018-12-27 LAB — HEMOGLOBIN A1C: Hgb A1c MFr Bld: 6.7 % — ABNORMAL HIGH (ref 4.6–6.5)

## 2018-12-27 LAB — VITAMIN D 25 HYDROXY (VIT D DEFICIENCY, FRACTURES): VITD: 58.97 ng/mL (ref 30.00–100.00)

## 2018-12-27 LAB — TSH: TSH: 3.48 u[IU]/mL (ref 0.35–4.50)

## 2018-12-27 LAB — T3, FREE: T3, Free: 2.4 pg/mL (ref 2.3–4.2)

## 2018-12-27 NOTE — Progress Notes (Signed)
Subjective:    Patient ID: [name], male    DOB: 10-Apr-1935, 83 y.o.   MRN: [id number]  HPI Here with his wife for a number of issues including memory loss, periods of confusion, occasional inability to speak the words he wants to say, recent falls, loss of balance, and weight loss. His appetite is poor and he has lost 8 lbs since his last visit with Dr. Elease Hashimoto 2 weeks ago. His BP has been stable at home. His diabetes has been well controlled. His last A1c in July was 6.6, and his am fasting glucoses generally run from 90 to 110. They have some friends who had side effects from Metformin, and they ask if he can stop this. He denies any headache or chest pain or SOB. No bowel issues. He has urinary frequency, but this has been a chronic problem. No recent fever or other signs of infection. He had been on B12 shots for awhile, but he was changed to oral tablets. The last time his blood b12 level was checked was a year and 1/2 ago. When he saw Dr. Elease Hashimoto several weeks ago they discussed some depression symptoms he was having, and he was started on Wellbutrin XL 150 mg daily. He says this has helped because he is happier, and his wife says he is more motivated to get up and do things around the house.    Review of Systems  Constitutional: Positive for appetite change, fatigue and unexpected weight change. Negative for chills, diaphoresis and fever.  Eyes: Negative.   Respiratory: Negative.   Cardiovascular: Negative.   Gastrointestinal: Negative.   Endocrine: Positive for cold intolerance. Negative for heat intolerance.  Genitourinary: Positive for frequency and urgency. Negative for dysuria, flank pain and hematuria.  Neurological: Positive for dizziness, tremors, speech difficulty, weakness and light-headedness. Negative for seizures, syncope, facial asymmetry, numbness and headaches.  Psychiatric/Behavioral: Positive for confusion, decreased concentration, dysphoric mood and sleep  disturbance. Negative for agitation, hallucinations, self-injury and suicidal ideas. The patient is nervous/anxious.        Objective:   Physical Exam Constitutional:      Appearance: Normal appearance.     Comments: He has a very unsteady gait, he walks slowly and holds onto the wall   Cardiovascular:     Rate and Rhythm: Normal rate and regular rhythm.     Pulses: Normal pulses.     Heart sounds: Normal heart sounds.  Pulmonary:     Effort: Pulmonary effort is normal.     Breath sounds: Normal breath sounds.  Abdominal:     General: Abdomen is flat. Bowel sounds are normal. There is no distension.     Palpations: Abdomen is soft. There is no mass.     Tenderness: There is no abdominal tenderness. There is no guarding or rebound.     Hernia: No hernia is present.  Musculoskeletal:        General: No swelling.     Right lower leg: No edema.     Left lower leg: No edema.  Lymphadenopathy:     Cervical: No cervical adenopathy.  Neurological:     Mental Status: He is alert and oriented to person, place, and time.     Cranial Nerves: No cranial nerve deficit.     Comments: Gait is unsteady. Finger to nose is normal. When standing with feet together and eyes closed he tends to fall to one side or the other. He has mild cog wheeling in the right  elbow. Both hands have slight resting tremors.            Assessment & Plan:  This elderly man has a number of issues, but his main problems seem to involve the central nervous system. He may have early Parkinsonian symptoms. We will set up a contrasted brain MRI soon. Get labs today including a B12 level. We agreed he will stop taking Metformin altogether and he will watch the glucoses closely. Follow up with Dr. Elease Hashimoto as scheduled on 01-10-19.  Alysia Penna, MD

## 2018-12-27 NOTE — Patient Instructions (Signed)
There are no preventive care reminders to display for this patient.  Depression screen [facility] 2/9 12/09/2018 09/14/2017 11/27/2016  Decreased Interest 3 0 0  Down, Depressed, Hopeless 2 0 1  PHQ - 2 Score 5 0 1  Altered sleeping 2 - 0  Tired, decreased energy 3 - 0  Change in appetite 0 - 0  Feeling bad or failure about yourself  3 - 0  Trouble concentrating 3 - 1  Moving slowly or fidgety/restless 3 - 0  Suicidal thoughts 0 - 0  PHQ-9 Score 19 - 2  Difficult doing work/chores Somewhat difficult - Not difficult at all

## 2018-12-29 DIAGNOSIS — R3915 Urgency of urination: Secondary | ICD-10-CM | POA: Diagnosis not present

## 2018-12-30 ENCOUNTER — Other Ambulatory Visit: Payer: Self-pay | Admitting: Family Medicine

## 2019-01-02 ENCOUNTER — Other Ambulatory Visit: Payer: Self-pay | Admitting: Family Medicine

## 2019-01-02 ENCOUNTER — Ambulatory Visit
Admission: RE | Admit: 2019-01-02 | Discharge: 2019-01-02 | Disposition: A | Discharge status: Home / Self Care | Payer: Medicare HMO | Source: Ambulatory Visit | Attending: Family Medicine | Admitting: Family Medicine

## 2019-01-02 ENCOUNTER — Other Ambulatory Visit: Payer: Self-pay

## 2019-01-02 DIAGNOSIS — N3941 Urge incontinence: Secondary | ICD-10-CM | POA: Diagnosis not present

## 2019-01-02 DIAGNOSIS — I6782 Cerebral ischemia: Secondary | ICD-10-CM | POA: Diagnosis not present

## 2019-01-02 DIAGNOSIS — G3281 Cerebellar ataxia in diseases classified elsewhere: Secondary | ICD-10-CM | POA: Diagnosis not present

## 2019-01-02 DIAGNOSIS — R351 Nocturia: Secondary | ICD-10-CM | POA: Diagnosis not present

## 2019-01-02 DIAGNOSIS — Z135 Encounter for screening for eye and ear disorders: Secondary | ICD-10-CM | POA: Diagnosis not present

## 2019-01-02 MED ORDER — GADOBENATE DIMEGLUMINE 529 MG/ML IV SOLN
15.0000 mL | Freq: Once | INTRAVENOUS | Status: AC | PRN
  Administered 2019-01-02: 15 mL via INTRAVENOUS

## 2019-01-10 ENCOUNTER — Ambulatory Visit (INDEPENDENT_AMBULATORY_CARE_PROVIDER_SITE_OTHER): Payer: Medicare HMO | Admitting: Family Medicine

## 2019-01-10 ENCOUNTER — Other Ambulatory Visit: Payer: Self-pay

## 2019-01-10 ENCOUNTER — Encounter: Payer: Self-pay | Admitting: Family Medicine

## 2019-01-10 VITALS — BP 100/60 | HR 87 | Temp 97.2°F | Ht 71.0 in | Wt 173.3 lb

## 2019-01-10 DIAGNOSIS — I959 Hypotension, unspecified: Secondary | ICD-10-CM | POA: Diagnosis not present

## 2019-01-10 DIAGNOSIS — F339 Major depressive disorder, recurrent, unspecified: Secondary | ICD-10-CM | POA: Diagnosis not present

## 2019-01-10 DIAGNOSIS — E871 Hypo-osmolality and hyponatremia: Secondary | ICD-10-CM

## 2019-01-10 DIAGNOSIS — R531 Weakness: Secondary | ICD-10-CM | POA: Diagnosis not present

## 2019-01-10 LAB — BASIC METABOLIC PANEL
BUN: 13 mg/dL (ref 6–23)
CO2: 31 mEq/L (ref 19–32)
Calcium: 10.1 mg/dL (ref 8.4–10.5)
Chloride: 91 mEq/L — ABNORMAL LOW (ref 96–112)
Creatinine, Ser: 1.08 mg/dL (ref 0.40–1.50)
GFR: 65.53 mL/min (ref >60.00)
Glucose, Bld: 150 mg/dL — ABNORMAL HIGH (ref 70–99)
Potassium: 3.5 mEq/L (ref 3.5–5.1)
Sodium: 133 mEq/L — ABNORMAL LOW (ref 135–145)

## 2019-01-10 NOTE — Progress Notes (Signed)
Subjective:     Patient ID: [name], male   DOB: [birth year], 83 y.o.   MRN: [id number]  HPI Here for follow-up regarding depression and also for recent MRI that was done in my absence couple weeks ago.  Patient has had some recent severe depression with PHQ-9 score of 19.  We added low-dose Wellbutrin 150 mg daily and his wife thinks his depression is improved.  However, he had a couple of recent falls.  Once he was getting out of the car and fell and the second episode occurred when he was feeding some birds out in the yard.  He was seen here and had multiple labs done which were significant for sodium of 128 and total bilirubin 3.4.  He has history of chronic mild elevated total bilirubin.  Other electrolytes were stable.  No past history of hyponatremia.  He is on HCTZ.  They saw his urologist and he stopped oxybutynin and wife thinks his balance is better since stopping that.  In fact, he walked to the car today without his walker for the first time in a while without difficulty.  Recent MRI showed some chronic lacunar infarcts but no acute changes.  He has had some recent weight loss and poor appetite in general.  Denies any major body aches or any focal weakness.  No headaches.  Past Medical History:  Diagnosis Date  . CELLULITIS, RIGHT LEG 04/22/2009  . DM w/o complication type II [id number]  . ECZEMA 05/08/2008  . HYPERLIPIDEMIA 05/08/2008  . HYPOTHYROIDISM 05/08/2008  . KNEE, ARTHRITIS, DEGEN./OSTEO 03/25/2009  . SUPERFICIAL PHLEBITIS 12/27/2008  . Unspecified essential hypertension 05/08/2008   Past Surgical History:  Procedure Laterality Date  . CATARACT EXTRACTION  2005   bilateral  . CHOLECYSTECTOMY    . COLONOSCOPY  04-15-2004   tics  . KNEE SURGERY     TKR Dr Alma Friendly 2007  . nasal polyps    . TONSILLECTOMY  1946  . WRIST SURGERY  2015   right side    reports that he has never smoked. He has never used smokeless tobacco. He reports that he does not drink alcohol or use  drugs. family history includes Arthritis in an other family member; Heart disease in an other family member; Hypertension in an other family member. No Known Allergies   Review of Systems  Constitutional: Positive for appetite change and fatigue.  Respiratory: Negative for cough and shortness of breath.   Cardiovascular: Negative for chest pain and palpitations.  Gastrointestinal: Negative for abdominal pain, blood in stool, diarrhea, nausea and vomiting.  Genitourinary: Negative for dysuria.  Neurological: Positive for weakness. Negative for dizziness, seizures, syncope, speech difficulty and headaches.  Hematological: Negative for adenopathy. Does not bruise/bleed easily.       Objective:   Physical Exam Vitals signs reviewed.  Constitutional:      Appearance: Normal appearance.  Cardiovascular:     Rate and Rhythm: Normal rate and regular rhythm.  Pulmonary:     Effort: Pulmonary effort is normal.     Breath sounds: Normal breath sounds.  Abdominal:     Palpations: Abdomen is soft.     Tenderness: There is no abdominal tenderness.  Musculoskeletal:     Right lower leg: No edema.     Left lower leg: No edema.  Neurological:     General: No focal deficit present.     Mental Status: He is alert and oriented to person, place, and time.     Cranial Nerves:  No cranial nerve deficit.     Coordination: Coordination normal.  Psychiatric:     Comments: PHQ 9 score of 18 Flat affect        Assessment:     #1 history of recurrent depression.  Recent PHQ 9 score 19.  Wife thinks he is clinically improved with low-dose Wellbutrin though he has score today of 18 which is much change.  He states he is more interactive since starting Wellbutrin and more engaged in doing activities  #2 generalized weakness.  May be combination of low blood pressure and hyponatremia.  See below.  Generally poor appetite for some time.  Wife does think this is improved some since stopping  oxybutynin  #3 hyponatremia.  Probably combination of factors including decreased oral intake, HCTZ, and possibly related to recent Wellbutrin  #4 low blood pressure.  Initial reading per nurse 140/88 but repeat by me seated left arm with normal size cuff 105/60 and standing 100/60.  We confirm systolic by palpation as well    Plan:     -Hold HCTZ for now -Recheck basic metabolic panel -Liberalize sodium intake slightly -Reluctant to increase Wellbutrin at this point until we confirm hyponatremia not related to medication -Set up physical therapy in [place] for generalized strengthening -Recommend walker use at all times -If hyponatremia not improving with discontinuation HCTZ consider further evaluation for adrenal insufficiency and other causes. -Recommended 2-week follow-up  [name] MD [facility]

## 2019-01-10 NOTE — Patient Instructions (Signed)
HOLD the HCTZ 12. 5 mg for now  Stay well hydrated and liberalize salt a little

## 2019-01-14 DIAGNOSIS — W19XXXA Unspecified fall, initial encounter: Secondary | ICD-10-CM | POA: Diagnosis not present

## 2019-01-14 DIAGNOSIS — R5381 Other malaise: Secondary | ICD-10-CM | POA: Diagnosis not present

## 2019-01-14 DIAGNOSIS — T148XXA Other injury of unspecified body region, initial encounter: Secondary | ICD-10-CM | POA: Diagnosis not present

## 2019-01-15 ENCOUNTER — Encounter: Payer: Self-pay | Admitting: Family Medicine

## 2019-01-16 ENCOUNTER — Other Ambulatory Visit: Payer: Self-pay

## 2019-01-17 ENCOUNTER — Encounter: Payer: Self-pay | Admitting: Physical Therapy

## 2019-01-17 ENCOUNTER — Ambulatory Visit: Payer: Medicare HMO | Attending: Family Medicine | Admitting: Physical Therapy

## 2019-01-17 ENCOUNTER — Encounter: Payer: Self-pay | Admitting: Family Medicine

## 2019-01-17 ENCOUNTER — Ambulatory Visit (INDEPENDENT_AMBULATORY_CARE_PROVIDER_SITE_OTHER): Payer: Medicare HMO

## 2019-01-17 ENCOUNTER — Ambulatory Visit (INDEPENDENT_AMBULATORY_CARE_PROVIDER_SITE_OTHER): Payer: Medicare HMO | Admitting: Family Medicine

## 2019-01-17 ENCOUNTER — Other Ambulatory Visit: Payer: Self-pay

## 2019-01-17 VITALS — BP 128/68 | HR 55 | Temp 97.0°F | Ht 71.0 in | Wt 176.0 lb

## 2019-01-17 DIAGNOSIS — R531 Weakness: Secondary | ICD-10-CM | POA: Diagnosis not present

## 2019-01-17 DIAGNOSIS — M546 Pain in thoracic spine: Secondary | ICD-10-CM

## 2019-01-17 DIAGNOSIS — R296 Repeated falls: Secondary | ICD-10-CM | POA: Insufficient documentation

## 2019-01-17 DIAGNOSIS — R262 Difficulty in walking, not elsewhere classified: Secondary | ICD-10-CM | POA: Diagnosis not present

## 2019-01-17 DIAGNOSIS — R2681 Unsteadiness on feet: Secondary | ICD-10-CM | POA: Diagnosis not present

## 2019-01-17 MED ORDER — TRAMADOL HCL 50 MG PO TABS: 50.0000 mg | ORAL_TABLET | Freq: Four times a day (QID) | ORAL | 0 refills | Status: AC | PRN

## 2019-01-17 NOTE — Progress Notes (Signed)
Subjective:     Patient ID: [name], male   DOB: 02/17/1936, 83 y.o.   MRN: [id number]  HPI Mr. Rockne Coons is seen following a fall which occurred at home on the 21st of this month.  He has had several recent falls.  There was recent discontinuation of oxybutynin and wife thought his balance was better after that.  However, when getting out of bed all day of recent fall 3 days ago he was reaching to get to his walker and apparently lost balance and fell with the walker.  He fell against a chair with his thoracic region.  He also hit his head against a closet door.  There was no loss of consciousness.  He had hematoma occipital area has had no headaches or increased confusion or lethargy since then.  No scalp laceration.  EMS came out and they thought there was low risk of fracture and vitals are stable.  He was not taken into the ED for further evaluation.  He has some cognitive deficits at baseline but no acute confusion  He has had no falls since the 21st.  We had already set up outpatient physical therapy up in [place] from last visit and he starts that later today.  He is having pain around the mid thoracic area slightly more on the right side but near the midline since the fall.  Pain is moderate at times.  Interfering with sleep.  Taking Aleve with some relief.  He notes pain is worse with movement and cough and sneeze and deep breathing.  No dyspnea.  No extremity injuries reported.  No nausea or vomiting.  Past Medical History:  Diagnosis Date  . CELLULITIS, RIGHT LEG 04/22/2009  . DM w/o complication type II Q000111Q  . ECZEMA 05/08/2008  . HYPERLIPIDEMIA 05/08/2008  . HYPOTHYROIDISM 05/08/2008  . KNEE, ARTHRITIS, DEGEN./OSTEO 03/25/2009  . SUPERFICIAL PHLEBITIS 12/27/2008  . Unspecified essential hypertension 05/08/2008   Past Surgical History:  Procedure Laterality Date  . CATARACT EXTRACTION  2005   bilateral  . CHOLECYSTECTOMY    . COLONOSCOPY  04-15-2004   tics  . KNEE  SURGERY     TKR Dr Alma Friendly 2007  . nasal polyps    . TONSILLECTOMY  1946  . WRIST SURGERY  2015   right side    reports that he has never smoked. He has never used smokeless tobacco. He reports that he does not drink alcohol or use drugs. family history includes Arthritis in an other family member; Heart disease in an other family member; Hypertension in an other family member. No Known Allergies   Review of Systems  Constitutional: Negative for appetite change and unexpected weight change.  Respiratory: Negative for shortness of breath.   Cardiovascular: Negative for chest pain.  Gastrointestinal: Negative for abdominal pain.  Musculoskeletal: Positive for back pain.  Neurological: Negative for seizures, syncope, speech difficulty and headaches.  Hematological: Negative for adenopathy.  Psychiatric/Behavioral: Negative for confusion.       Objective:   Physical Exam Constitutional:      Appearance: Normal appearance.  HENT:     Head: Normocephalic and atraumatic.  Cardiovascular:     Rate and Rhythm: Normal rate and regular rhythm.  Pulmonary:     Effort: Pulmonary effort is normal.     Breath sounds: Normal breath sounds.  Musculoskeletal:     Comments: He has some mild tenderness mid thoracic area near the midline.  He has some mild soft tissue tenderness just right of  the midline mid thoracic back.  No visible ecchymosis or swelling  No lumbar tenderness  Neurological:     Mental Status: He is alert.        Assessment:     #1 recent falls.  He has physical therapy evaluation pending as above  #2 midthoracic back pain following fall-rule out compression fracture    Plan:     -Obtain plain films thoracic spine-no obvious acute compression fracture but this will be over read by radiology -Physical therapy as discussed.  We also discussed possible occupational therapy evaluation to evaluate home environment if falls continue  [name] MD [facility]

## 2019-01-17 NOTE — Therapy (Signed)
[facility] [place], [place], [address] Phone: [phone]   Fax:  [phone]  Physical Therapy Evaluation  Patient Details  Name: [name] MRN: [id number] Date of Birth: [birth year] Referring Provider (PT): Carolann Littler, MD   Encounter Date: 01/17/2019  PT End of Session - 01/17/19 1536    Visit Number  1    Number of Visits  12    Date for PT Re-Evaluation  03/07/19    PT Start Time  1430    PT Stop Time  1520    PT Time Calculation (min)  50 min    Equipment Utilized During Treatment  --   rolling walker   Activity Tolerance  Patient tolerated treatment well    Behavior During Therapy  [facility] for tasks assessed/performed       Past Medical History:  Diagnosis Date  . CELLULITIS, RIGHT LEG 04/22/2009  . DM w/o complication type II [id number]  . ECZEMA 05/08/2008  . HYPERLIPIDEMIA 05/08/2008  . HYPOTHYROIDISM 05/08/2008  . KNEE, ARTHRITIS, DEGEN./OSTEO 03/25/2009  . SUPERFICIAL PHLEBITIS 12/27/2008  . Unspecified essential hypertension 05/08/2008    Past Surgical History:  Procedure Laterality Date  . CATARACT EXTRACTION  2005   bilateral  . CHOLECYSTECTOMY    . COLONOSCOPY  04-15-2004   tics  . KNEE SURGERY     TKR Dr Alma Friendly 2007  . nasal polyps    . TONSILLECTOMY  1946  . WRIST SURGERY  2015   right side    There were no vitals filed for this visit.   Subjective Assessment - 01/17/19 1538    Subjective  COVID-19 screening performed upon arrival.Patient arrives to physical therapy with reports of decreased balance, difficulty walking and frequent falls that has progressed over the past couple months. Patient reports his most recent fall was on 01/14/2019 when he fell out of bed trying to go to the bathroom at night. Patient reported hitting his back and his head. Patient's wife reported having to call EMS to get the patient up. Patient reports ongoing weakness along with his decreased balance. Patient  reports pain in his low back due to the fall with various activities. Patient's goals are to improve balance and improve ability to perform normal activities.    Pertinent History  HTN    Limitations  Standing;Walking;House hold activities    Diagnostic tests  x-ray: normal    Patient Stated Goals  get back to normal with no falls    Currently in Pain?  Yes    Pain Score  6     Pain Location  Back    Pain Orientation  Lower    Pain Descriptors / Indicators  Aching;Sore    Pain Type  Acute pain    Pain Onset  In the past 7 days    Pain Frequency  Constant         [facility] PT Assessment - 01/17/19 0001      Assessment   Medical Diagnosis  Weakness    Referring Provider (PT)  Carolann Littler, MD    Onset Date/Surgical Date  --   ongoing most recent fall: 01/14/19   Next MD Visit  01/24/2019    Prior Therapy  no      Precautions   Precautions  Fall      Restrictions   Weight Bearing Restrictions  No      Balance Screen   Has the patient fallen in the past 6 months  Yes  How many times?  6    Has the patient had a decrease in activity level because of a fear of falling?   Yes    Is the patient reluctant to leave their home because of a fear of falling?   Yes      [place] residence    Living Arrangements  Spouse/significant other      Prior Function   Level of Independence  Independent with household mobility with device;Requires assistive device for independence      ROM / Strength   AROM / PROM / Strength  Strength      Strength   Strength Assessment Site  Knee;Hip;Ankle    Right/Left Hip  Right;Left    Right Hip Flexion  3+/5    Right Hip ABduction  4/5   in sitting   Right Hip ADduction  4/5   in sitting   Left Hip Flexion  3/5   pain in low back   Left Hip ABduction  4/5   in sitting   Left Hip ADduction  4/5   in sitting   Right/Left Knee  Right;Left    Right Knee Flexion  4+/5    Right Knee Extension  4+/5    Left  Knee Flexion  4+/5    Left Knee Extension  4+/5    Right/Left Ankle  Right;Left    Right Ankle Dorsiflexion  4/5    Left Ankle Dorsiflexion  4/5      Transfers   Comments  cautious transitions with pain with low back due to most recent fall      Ambulation/Gait   Assistive device  Rolling walker    Gait Pattern  Step-through pattern;Decreased stride length;Decreased step length - right;Decreased step length - left;Right flexed knee in stance;Left flexed knee in stance;Trunk flexed    Gait velocity  slow, cautious gait      Balance   Balance Assessed  Yes      Standardized Balance Assessment   Standardized Balance Assessment  Five Times Sit to Stand    Five times sit to stand comments   Modified 5x sit to stand 26 seconds with UE support                Objective measurements completed on examination: See above findings.              PT Education - 01/17/19 1541    Education Details  heel raises, toe raises, hip abduction, hip adduction in sitting    Person(s) Educated  Patient;Spouse    Methods  Explanation;Demonstration;Handout    Comprehension  Verbalized understanding;Returned demonstration          PT Long Term Goals - 01/17/19 1603      PT LONG TERM GOAL #1   Title  Patient will be independent with HEP    Time  6    Period  Weeks    Status  New      PT LONG TERM GOAL #2   Title  Patient will demonstrate 4+/5 bilateral LE MMT to improve stability during functional tasks.    Time  6    Period  Weeks    Status  New      PT LONG TERM GOAL #3   Title  Patient will perform modified 5x sit to stand test in 18 seconds or less to improve balance and decrease risk of falls.    Time  6  Period  Weeks    Status  New      PT LONG TERM GOAL #4   Title  Patient will ambulate community distances with walker or LRD to safely access his community.    Time  6    Period  Weeks    Status  New             Plan - 01/17/19 1548    Clinical  Impression Statement  Patient is an 83 year old male who presents to physical therapy with his wife with decreased balance and decreased LE MMT. Patient ambulates with a rolling walker with decreased gait speed, decreased stride length, and increased bilateral knee flexion during stance. Patient and PT discussed HEP as well as importance of performing to improve gait, balance, and strength. Patient reported understanding. Patient would benefit from skilled physical therapy to address deficits and address patient's goals.    Personal Factors and Comorbidities  Age;Time since onset of injury/illness/exacerbation;Comorbidity 1    Comorbidities  HTN    Examination-Activity Limitations  Stand;Transfers;Locomotion Level    Stability/Clinical Decision Making  Stable/Uncomplicated    Clinical Decision Making  Low    Rehab Potential  Good    PT Frequency  2x / week    PT Duration  6 weeks    PT Treatment/Interventions  ADLs/Self Care Home Management;Balance training;Therapeutic exercise;Therapeutic activities;Functional mobility training;Stair training;Gait training;Neuromuscular re-education;Patient/family education;Manual techniques;Passive range of motion    PT Next Visit Plan  Nustep, seated and standing LE strengthening, balance activities, gait training with rolling walker.    PT Home Exercise Plan  see patient education section    Consulted and Agree with Plan of Care  Patient       Patient will benefit from skilled therapeutic intervention in order to improve the following deficits and impairments:  Decreased activity tolerance, Decreased balance, Decreased strength, Difficulty walking, Pain, Postural dysfunction  Visit Diagnosis: Unsteadiness on feet - Plan: PT plan of care cert/re-cert  Difficulty in walking, not elsewhere classified - Plan: PT plan of care cert/re-cert  Repeated falls - Plan: PT plan of care cert/re-cert     Problem List Patient Active Problem List   Diagnosis Date  Noted  . Hyponatremia 01/10/2019  . Depression, recurrent ([place]) 12/27/2018  . CKD (chronic kidney disease) stage 3, GFR 30-59 ml/min 03/04/2018  . Urinary urgency 01/13/2017  . Cognitive decline 11/27/2016  . Vitamin B 12 deficiency 04/12/2015  . Erectile dysfunction 07/07/2010  . CELLULITIS, RIGHT LEG 04/22/2009  . KNEE, ARTHRITIS, DEGEN./OSTEO 03/25/2009  . SUPERFICIAL PHLEBITIS 12/27/2008  . Hypothyroidism 05/08/2008  . Type 2 diabetes mellitus, controlled ([place]) 05/08/2008  . Hyperlipidemia 05/08/2008  . Essential hypertension 05/08/2008  . ECZEMA 05/08/2008    Gabriela Eves, PT, DPT 01/17/2019, 5:46 PM  [facility] [address] [place], [place], [address] Phone: [phone]   Fax:  [phone]  Name: JACORY MCGRANE MRN: [id number] Date of Birth: Oct 13, 1935

## 2019-01-17 NOTE — Patient Instructions (Signed)

## 2019-01-18 DIAGNOSIS — N3941 Urge incontinence: Secondary | ICD-10-CM | POA: Diagnosis not present

## 2019-01-24 ENCOUNTER — Encounter: Payer: Self-pay | Admitting: Family Medicine

## 2019-01-24 ENCOUNTER — Ambulatory Visit (INDEPENDENT_AMBULATORY_CARE_PROVIDER_SITE_OTHER): Payer: Medicare HMO | Admitting: Family Medicine

## 2019-01-24 ENCOUNTER — Encounter: Payer: Self-pay | Admitting: Physical Therapy

## 2019-01-24 ENCOUNTER — Ambulatory Visit: Payer: Medicare HMO | Attending: Family Medicine | Admitting: Physical Therapy

## 2019-01-24 ENCOUNTER — Other Ambulatory Visit: Payer: Self-pay

## 2019-01-24 VITALS — BP 115/78 | HR 57 | Temp 97.0°F | Ht 71.0 in | Wt 172.0 lb

## 2019-01-24 DIAGNOSIS — R2681 Unsteadiness on feet: Secondary | ICD-10-CM | POA: Insufficient documentation

## 2019-01-24 DIAGNOSIS — R296 Repeated falls: Secondary | ICD-10-CM | POA: Diagnosis not present

## 2019-01-24 DIAGNOSIS — R3915 Urgency of urination: Secondary | ICD-10-CM

## 2019-01-24 DIAGNOSIS — R531 Weakness: Secondary | ICD-10-CM

## 2019-01-24 DIAGNOSIS — R262 Difficulty in walking, not elsewhere classified: Secondary | ICD-10-CM | POA: Insufficient documentation

## 2019-01-24 DIAGNOSIS — E871 Hypo-osmolality and hyponatremia: Secondary | ICD-10-CM | POA: Diagnosis not present

## 2019-01-24 DIAGNOSIS — R5383 Other fatigue: Secondary | ICD-10-CM

## 2019-01-24 NOTE — Therapy (Signed)
[facility] [place], [place], [address] Phone: [phone]   Fax:  [phone]  Physical Therapy Treatment  Patient Details  Name: [name] MRN: [id number] Date of Birth: Nov 14, 1935 Referring Provider (PT): Carolann Littler, MD   Encounter Date: 01/24/2019  PT End of Session - 01/24/19 [id number]    Visit Number  2    Number of Visits  12    Date for PT Re-Evaluation  03/07/19    PT Start Time  1430    PT Stop Time  1505    PT Time Calculation (min)  35 min    Activity Tolerance  Patient tolerated treatment well    Behavior During Therapy  [facility] for tasks assessed/performed       Past Medical History:  Diagnosis Date  . CELLULITIS, RIGHT LEG 04/22/2009  . DM w/o complication type II [id number]  . ECZEMA 05/08/2008  . HYPERLIPIDEMIA 05/08/2008  . HYPOTHYROIDISM 05/08/2008  . KNEE, ARTHRITIS, DEGEN./OSTEO 03/25/2009  . SUPERFICIAL PHLEBITIS 12/27/2008  . Unspecified essential hypertension 05/08/2008    Past Surgical History:  Procedure Laterality Date  . CATARACT EXTRACTION  2005   bilateral  . CHOLECYSTECTOMY    . COLONOSCOPY  04-15-2004   tics  . KNEE SURGERY     TKR Dr Alma Friendly 2007  . nasal polyps    . TONSILLECTOMY  1946  . WRIST SURGERY  2015   right side    There were no vitals filed for this visit.  Subjective Assessment - 01/24/19 1436    Subjective  COVID-19 screening performed upon arrival. Patient arrives stating he is feeling good.    Pertinent History  HTN    Limitations  Standing;Walking;House hold activities    Diagnostic tests  x-ray: normal    Patient Stated Goals  get back to normal with no falls         [facility] PT Assessment - 01/24/19 0001      Assessment   Medical Diagnosis  Weakness    Referring Provider (PT)  Carolann Littler, MD    Next MD Visit  01/24/2019    Prior Therapy  no      Precautions   Precautions  [facility] Adult PT Treatment/Exercise -  01/24/19 0001      Exercises   Exercises  Knee/Hip;Lumbar      Lumbar Exercises: Seated   Other Seated Lumbar Exercises  core crunches x10 followed by trunk rotation      Knee/Hip Exercises: Aerobic   Nustep  Level 3 x10 minutes      Knee/Hip Exercises: Standing   Knee Flexion  AROM;Both;2 sets;10 reps    Hip Flexion  AROM;Both;2 sets;10 reps;Knee bent   toe tapping on 6" step   Hip Abduction  AROM;Both;2 sets;10 reps;Knee straight      Knee/Hip Exercises: Seated   Long Arc Quad  AROM;Both;2 sets;10 reps    [facility]  x20 5" hold    Clamshell with TheraBand  Yellow   x20   Hamstring Curl  --                  PT Long Term Goals - 01/17/19 1603      PT LONG TERM GOAL #1   Title  Patient will be independent with HEP    Time  6    Period  Weeks    Status  New      PT LONG TERM GOAL #2   Title  Patient will demonstrate 4+/5 bilateral LE MMT to improve stability during functional tasks.    Time  6    Period  Weeks    Status  New      PT LONG TERM GOAL #3   Title  Patient will perform modified 5x sit to stand test in 18 seconds or less to improve balance and decrease risk of falls.    Time  6    Period  Weeks    Status  New      PT LONG TERM GOAL #4   Title  Patient will ambulate community distances with walker or LRD to safely access his community.    Time  6    Period  Weeks    Status  New            Plan - 01/24/19 1607    Clinical Impression Statement  Patient arrives doing fairly well but reported an increase of fatigue as therapy sessions continued. Patient noted with poor core activation as patient sat with a backward lean when back was unsupported. Patient provided with hand support to prevent leaning too far. Patient and PT discussed he typically falls backwards when he falls. Patient and PT discussed the goals of PT and ways of impoving balance. Patient reported understanding. Patient's session terminated early due to fatigue. Patient  required close supervision/contact guard during ambulation due to LE weakness and due to fear of LEs giving out. Patient provided with cuing to reduce shuffling and improve bilateral foot clearance with minimal carryover.    Personal Factors and Comorbidities  Age;Time since onset of injury/illness/exacerbation;Comorbidity 1    Comorbidities  HTN    Examination-Activity Limitations  Stand;Transfers;Locomotion Level    Stability/Clinical Decision Making  Stable/Uncomplicated    Clinical Decision Making  Low    Rehab Potential  Good    PT Frequency  2x / week    PT Duration  6 weeks    PT Treatment/Interventions  ADLs/Self Care Home Management;Balance training;Therapeutic exercise;Therapeutic activities;Functional mobility training;Stair training;Gait training;Neuromuscular re-education;Patient/family education;Manual techniques;Passive range of motion    PT Next Visit Plan  Nustep, seated and standing LE strengthening, balance activities, gait training with rolling walker.    PT Home Exercise Plan  see patient education section    Consulted and Agree with Plan of Care  Patient       Patient will benefit from skilled therapeutic intervention in order to improve the following deficits and impairments:  Decreased activity tolerance, Decreased balance, Decreased strength, Difficulty walking, Pain, Postural dysfunction  Visit Diagnosis: Unsteadiness on feet  Difficulty in walking, not elsewhere classified  Repeated falls     Problem List Patient Active Problem List   Diagnosis Date Noted  . Frequent falls 01/24/2019  . Hyponatremia 01/10/2019  . Depression, recurrent ([place]) 12/27/2018  . CKD (chronic kidney disease) stage 3, GFR 30-59 ml/min 03/04/2018  . Urinary urgency 01/13/2017  . Cognitive decline 11/27/2016  . Vitamin B 12 deficiency 04/12/2015  . Erectile dysfunction 07/07/2010  . CELLULITIS, RIGHT LEG 04/22/2009  . KNEE, ARTHRITIS, DEGEN./OSTEO 03/25/2009  . SUPERFICIAL  PHLEBITIS 12/27/2008  . Hypothyroidism 05/08/2008  . Type 2 diabetes mellitus, controlled ([place]) 05/08/2008  . Hyperlipidemia 05/08/2008  . Essential hypertension 05/08/2008  . ECZEMA 05/08/2008    Gabriela Eves, PT, DPT 01/24/2019, 4:19 PM  [facility] [address] [place], [place], [address] Phone: [phone]  Fax:  [phone]  Name: [name] MRN: [id number] Date of Birth: 10/01/1935

## 2019-01-24 NOTE — Progress Notes (Signed)
Subjective:     Patient ID: [name], male   DOB: 12/12/1935, 83 y.o.   MRN: [id number]  HPI [name] has hypertension, type 2 diabetes, osteoarthritis, chronic kidney disease, history of chronic urinary urgency, recent hyponatremia, hyperlipidemia, history of cognitive impairment.  Recent issues with recurrent falls.  Refer to prior note.  We obtained multiple labs.  His B12 and thyroid were normal range.  He complained of generalized weakness.  Sodium was 128.  We discontinued HCTZ and subsequent sodium up to 133. Diabetes has been well controlled with recent A1c 6.7%  He is followed by urology regarding his urine frequency.  Still gets up about 4-5 times at night.  Recent discontinuation of oxybutynin because of his recurrent falls.  He was started on Myrbetriq but does not feel this is helping his urine frequency as much.  He also had recent vitamin D level that was normal.  He has been set up for physical therapy and starts that this week.  They have already given him some home exercises.  He is not any fall since he was here couple weeks ago.  He had fallen and had some thoracic back pain and plain films did not show any compression fracture.  His pain is better.  He is sleeping better at night with less back pain  Past Medical History:  Diagnosis Date  . CELLULITIS, RIGHT LEG 04/22/2009  . DM w/o complication type II [id number]  . ECZEMA 05/08/2008  . HYPERLIPIDEMIA 05/08/2008  . HYPOTHYROIDISM 05/08/2008  . KNEE, ARTHRITIS, DEGEN./OSTEO 03/25/2009  . SUPERFICIAL PHLEBITIS 12/27/2008  . Unspecified essential hypertension 05/08/2008   Past Surgical History:  Procedure Laterality Date  . CATARACT EXTRACTION  2005   bilateral  . CHOLECYSTECTOMY    . COLONOSCOPY  04-15-2004   tics  . KNEE SURGERY     TKR Dr Alma Friendly 2007  . nasal polyps    . TONSILLECTOMY  1946  . WRIST SURGERY  2015   right side    reports that he has never smoked. He has never used smokeless tobacco. He  reports that he does not drink alcohol or use drugs. family history includes Arthritis in an other family member; Heart disease in an other family member; Hypertension in an other family member. No Known Allergies   Review of Systems  Constitutional: Positive for fatigue. Negative for chills, fever and unexpected weight change.  Respiratory: Negative for cough and shortness of breath.   Cardiovascular: Negative for chest pain.  Gastrointestinal: Negative for abdominal pain.  Genitourinary: Negative for dysuria.  Neurological: Positive for weakness. Negative for syncope, speech difficulty and headaches.  Psychiatric/Behavioral: Negative for agitation.       Objective:   Physical Exam Vitals signs reviewed.  Constitutional:      Appearance: Normal appearance.  Neck:     Musculoskeletal: Neck supple.  Cardiovascular:     Rate and Rhythm: Normal rate and regular rhythm.  Pulmonary:     Effort: Pulmonary effort is normal.     Breath sounds: Normal breath sounds.  Musculoskeletal:     Right lower leg: No edema.     Left lower leg: No edema.  Neurological:     General: No focal deficit present.     Mental Status: He is alert.     Cranial Nerves: No cranial nerve deficit.        Assessment:     #1 generalized weakness with increased risk for falls.  #2 recent hyponatremia probably related to  decreased intake and thiazide diuretic.  Improved after discontinuation of HCTZ  #3 type 2 diabetes well controlled  #4 fatigue.  Probably multifactorial.  Suspect his frequent nocturia and disruption of sleep is playing a big role in his fatigue and weakness    Plan:     -Discussed fall prevention in the home with handout given -Physical therapy has been ordered and they will start that this week in [place] -Continue to check blood pressure and if he has any orthostatic symptoms be in touch.  We may need to eventually reduce his blood pressure medication further -We will plan  routine follow-up in 3 months and sooner as needed  [name] MD [facility]

## 2019-01-24 NOTE — Patient Instructions (Signed)

## 2019-01-26 ENCOUNTER — Other Ambulatory Visit: Payer: Self-pay

## 2019-01-26 ENCOUNTER — Ambulatory Visit: Payer: Medicare HMO | Admitting: Physical Therapy

## 2019-01-26 DIAGNOSIS — R262 Difficulty in walking, not elsewhere classified: Secondary | ICD-10-CM

## 2019-01-26 DIAGNOSIS — R2681 Unsteadiness on feet: Secondary | ICD-10-CM | POA: Diagnosis not present

## 2019-01-26 DIAGNOSIS — R296 Repeated falls: Secondary | ICD-10-CM

## 2019-01-26 NOTE — Therapy (Signed)
[facility] [place], [place], [address] Phone: [phone]   Fax:  [phone]  Physical Therapy Treatment  Patient Details  Name: [name] MRN: [id number] Date of Birth: [birth year] Referring Provider (PT): Carolann Littler, MD   Encounter Date: 01/26/2019  PT End of Session - 01/26/19 1434    Visit Number  3    Number of Visits  12    Date for PT Re-Evaluation  03/07/19    Authorization Type  progress note every 10th visit, KX modifier at 15th visit    PT Start Time  1430    PT Stop Time  1508    PT Time Calculation (min)  38 min    Equipment Utilized During Treatment  Other (comment)   rolling walker   Activity Tolerance  Patient tolerated treatment well    Behavior During Therapy  [facility] for tasks assessed/performed       Past Medical History:  Diagnosis Date  . CELLULITIS, RIGHT LEG 04/22/2009  . DM w/o complication type II [id number]  . ECZEMA 05/08/2008  . HYPERLIPIDEMIA 05/08/2008  . HYPOTHYROIDISM 05/08/2008  . KNEE, ARTHRITIS, DEGEN./OSTEO 03/25/2009  . SUPERFICIAL PHLEBITIS 12/27/2008  . Unspecified essential hypertension 05/08/2008    Past Surgical History:  Procedure Laterality Date  . CATARACT EXTRACTION  2005   bilateral  . CHOLECYSTECTOMY    . COLONOSCOPY  04-15-2004   tics  . KNEE SURGERY     TKR Dr Alma Friendly 2007  . nasal polyps    . TONSILLECTOMY  1946  . WRIST SURGERY  2015   right side    There were no vitals filed for this visit.  Subjective Assessment - 01/26/19 1435    Subjective  COVID-19 screening performed upon arrival. Patient arrives stating he is feeling good today but "felt it" after last session.    Pertinent History  HTN    Limitations  Standing;Walking;House hold activities    Diagnostic tests  x-ray: normal    Patient Stated Goals  get back to normal with no falls    Currently in Pain?  Yes   did not provide number on pain scale        [facility] PT Assessment - 01/26/19  0001      Assessment   Medical Diagnosis  Weakness    Referring Provider (PT)  Carolann Littler, MD    Next MD Visit  01/24/2019    Prior Therapy  no      Precautions   Precautions  Fall                   [facility] PT Treatment/Exercise - 01/26/19 0001      Ambulation/Gait   Ambulation/Gait  Yes    Ambulation/Gait Assistance  4: Min guard   contact guard   Ambulation Distance (Feet)  36 Feet    Assistive device  Rolling walker    Gait Pattern  Step-through pattern;Decreased stride length;Decreased step length - right;Decreased step length - left;Trunk flexed;Poor foot clearance - right;Poor foot clearance - left;Right foot flat;Left foot flat;Shuffle    Gait velocity  slow, cautious gait    Gait Comments  cuing for step length and for foot clearance      Exercises   Exercises  Knee/Hip;Lumbar      Lumbar Exercises: Seated   Other Seated Lumbar Exercises  seated reach outs x20      Knee/Hip Exercises: Aerobic   Nustep  Level 3 x12 minutes  Knee/Hip Exercises: Standing   Heel Raises  Both;20 reps    Hip Flexion  AROM;Both;2 sets;10 reps;Knee bent    Hip Abduction  AROM;Both;2 sets;10 reps;Knee straight      Knee/Hip Exercises: Seated   Long Arc Quad  AROM;Both;2 sets;10 reps    Long [employer] Limitations  with ball squeeze    Ball Squeeze  x20 5" hold    Clamshell with TheraBand  Yellow   x20   Marching  AROM;Both;20 reps                  PT Long Term Goals - 01/17/19 1603      PT LONG TERM GOAL #1   Title  Patient will be independent with HEP    Time  6    Period  Weeks    Status  New      PT LONG TERM GOAL #2   Title  Patient will demonstrate 4+/5 bilateral LE MMT to improve stability during functional tasks.    Time  6    Period  Weeks    Status  New      PT LONG TERM GOAL #3   Title  Patient will perform modified 5x sit to stand test in 18 seconds or less to improve balance and decrease risk of falls.    Time  6    Period   Weeks    Status  New      PT LONG TERM GOAL #4   Title  Patient will ambulate community distances with walker or LRD to safely access his community.    Time  6    Period  Weeks    Status  New            Plan - 01/26/19 1513    Clinical Impression Statement  Patient responded fairly well to therapy session today but with ongoing fatigue. Patient noted with improve sitting posture with cuing and with sitting in chair with back support. Patient noted with improved form and technique if provided with visual cues throughout TE. Patient required verbal cuing for proper gait mechanics to prevent shuffling. Patient and PT discussed utilizing walker for safety rather than being without it. Patient reported understanding.    Personal Factors and Comorbidities  Age;Time since onset of injury/illness/exacerbation;Comorbidity 1    Comorbidities  HTN    Examination-Activity Limitations  Stand;Transfers;Locomotion Level    Stability/Clinical Decision Making  Stable/Uncomplicated    Clinical Decision Making  Low    Rehab Potential  Good    PT Frequency  2x / week    PT Duration  6 weeks    PT Treatment/Interventions  ADLs/Self Care Home Management;Balance training;Therapeutic exercise;Therapeutic activities;Functional mobility training;Stair training;Gait training;Neuromuscular re-education;Patient/family education;Manual techniques;Passive range of motion    PT Next Visit Plan  Nustep, seated and standing LE strengthening, balance activities, gait training with rolling walker.    PT Home Exercise Plan  see patient education section    Consulted and Agree with Plan of Care  Patient       Patient will benefit from skilled therapeutic intervention in order to improve the following deficits and impairments:  Decreased activity tolerance, Decreased balance, Decreased strength, Difficulty walking, Pain, Postural dysfunction  Visit Diagnosis: Unsteadiness on feet  Difficulty in walking, not elsewhere  classified  Repeated falls     Problem List Patient Active Problem List   Diagnosis Date Noted  . Frequent falls 01/24/2019  . Hyponatremia 01/10/2019  . Depression, recurrent ([place])  12/27/2018  . CKD (chronic kidney disease) stage 3, GFR 30-59 ml/min 03/04/2018  . Urinary urgency 01/13/2017  . Cognitive decline 11/27/2016  . Vitamin B 12 deficiency 04/12/2015  . Erectile dysfunction 07/07/2010  . CELLULITIS, RIGHT LEG 04/22/2009  . KNEE, ARTHRITIS, DEGEN./OSTEO 03/25/2009  . SUPERFICIAL PHLEBITIS 12/27/2008  . Hypothyroidism 05/08/2008  . Type 2 diabetes mellitus, controlled ([place]) 05/08/2008  . Hyperlipidemia 05/08/2008  . Essential hypertension 05/08/2008  . ECZEMA 05/08/2008    Gabriela Eves, PT, DPT 01/26/2019, 3:25 PM  [facility] [place], [place], [address] Phone: [phone]   Fax:  [phone]  Name: CARSEN SHODA MRN: [id number] Date of Birth: Nov 19, 1935

## 2019-01-30 DIAGNOSIS — R350 Frequency of micturition: Secondary | ICD-10-CM | POA: Diagnosis not present

## 2019-01-30 DIAGNOSIS — R300 Dysuria: Secondary | ICD-10-CM | POA: Diagnosis not present

## 2019-01-31 ENCOUNTER — Ambulatory Visit: Payer: Medicare HMO | Admitting: Physical Therapy

## 2019-01-31 ENCOUNTER — Other Ambulatory Visit: Payer: Self-pay

## 2019-01-31 ENCOUNTER — Encounter: Payer: Self-pay | Admitting: Physical Therapy

## 2019-01-31 DIAGNOSIS — R296 Repeated falls: Secondary | ICD-10-CM

## 2019-01-31 DIAGNOSIS — R2681 Unsteadiness on feet: Secondary | ICD-10-CM | POA: Diagnosis not present

## 2019-01-31 DIAGNOSIS — R262 Difficulty in walking, not elsewhere classified: Secondary | ICD-10-CM | POA: Diagnosis not present

## 2019-01-31 NOTE — Therapy (Signed)
[facility] [place], [place], [address] Phone: [phone]   Fax:  [phone]  Physical Therapy Treatment  Patient Details  Name: [name] MRN: [id number] Date of Birth: 03/30/1935 Referring Provider (PT): Carolann Littler, MD   Encounter Date: 01/31/2019  PT End of Session - 01/31/19 1400    Visit Number  4    Number of Visits  12    Date for PT Re-Evaluation  03/07/19    Authorization Type  progress note every 10th visit, KX modifier at 15th visit    PT Start Time  1345    PT Stop Time  1420   limited by fatigue   PT Time Calculation (min)  35 min    Equipment Utilized During Treatment  Other (comment)   FWW   Activity Tolerance  Patient tolerated treatment well    Behavior During Therapy  [facility] for tasks assessed/performed       Past Medical History:  Diagnosis Date  . CELLULITIS, RIGHT LEG 04/22/2009  . DM w/o complication type II Q000111Q  . ECZEMA 05/08/2008  . HYPERLIPIDEMIA 05/08/2008  . HYPOTHYROIDISM 05/08/2008  . KNEE, ARTHRITIS, DEGEN./OSTEO 03/25/2009  . SUPERFICIAL PHLEBITIS 12/27/2008  . Unspecified essential hypertension 05/08/2008    Past Surgical History:  Procedure Laterality Date  . CATARACT EXTRACTION  2005   bilateral  . CHOLECYSTECTOMY    . COLONOSCOPY  04-15-2004   tics  . KNEE SURGERY     TKR Dr Alma Friendly 2007  . nasal polyps    . TONSILLECTOMY  1946  . WRIST SURGERY  2015   right side    There were no vitals filed for this visit.  Subjective Assessment - 01/31/19 1359    Subjective  COVID-19 screening performed upon arrival. No complaints upon arrival.    Pertinent History  HTN    Limitations  Standing;Walking;House hold activities    Diagnostic tests  x-ray: normal    Patient Stated Goals  get back to normal with no falls    Currently in Pain?  No/denies         [facility] PT Assessment - 01/31/19 0001      Assessment   Medical Diagnosis  Weakness    Referring Provider  (PT)  Carolann Littler, MD    Next MD Visit  01/24/2019    Prior Therapy  no      Precautions   Precautions  Fall                   [facility] Adult PT Treatment/Exercise - 01/31/19 0001      Knee/Hip Exercises: Aerobic   Nustep  L4 x16 min      Knee/Hip Exercises: Standing   Heel Raises  Both;20 reps    Heel Raises Limitations  B toe raise x20 reps    Hip Flexion  AROM;Both;2 sets;10 reps;Knee bent    Hip Abduction  AROM;Both;2 sets;10 reps;Knee straight      Knee/Hip Exercises: Seated   Long Arc Quad  Strengthening;Both;2 sets;10 reps;Weights    Long Arc Quad Weight  3 lbs.    Ball Squeeze  x20 5" hold    Clamshell with TheraBand  Yellow   x20 reps   Other Seated Knee/Hip Exercises  Seated core reaching 4# x20 reps    Other Seated Knee/Hip Exercises  Seated core D2 with 2# ball x15 reps each    Hamstring Curl  Strengthening;Both;2 sets;10 reps;Limitations    Hamstring Limitations  yellow theraband  Sit to [facility]  with UE support   x2 reps; mod- max assist +1 due to fatigue                 PT Long Term Goals - 01/17/19 1603      PT LONG TERM GOAL #1   Title  Patient will be independent with HEP    Time  6    Period  Weeks    Status  New      PT LONG TERM GOAL #2   Title  Patient will demonstrate 4+/5 bilateral LE MMT to improve stability during functional tasks.    Time  6    Period  Weeks    Status  New      PT LONG TERM GOAL #3   Title  Patient will perform modified 5x sit to stand test in 18 seconds or less to improve balance and decrease risk of falls.    Time  6    Period  Weeks    Status  New      PT LONG TERM GOAL #4   Title  Patient will ambulate community distances with walker or LRD to safely access his community.    Time  6    Period  Weeks    Status  New            Plan - 01/31/19 1449    Clinical Impression Statement  Patient presented in clinic with no complaints. Patient able to tolerate standing exercises fairly well  although fatigue evident in short duration. Extreme lack of core strength noted during sitting exercises even with VCs to self correct during therex. Seated core reachouts and D2 completed with PTA providing mod- max assist for seated posture. Sit <> stands limited due to fatigue even with PTA assist (mod-max). Patient educated to push up from stable chair or seat instead of FWW to reduce rish of fall.    Personal Factors and Comorbidities  Age;Time since onset of injury/illness/exacerbation;Comorbidity 1    Comorbidities  HTN    Examination-Activity Limitations  Stand;Transfers;Locomotion Level    Stability/Clinical Decision Making  Stable/Uncomplicated    Rehab Potential  Good    PT Frequency  2x / week    PT Duration  6 weeks    PT Treatment/Interventions  ADLs/Self Care Home Management;Balance training;Therapeutic exercise;Therapeutic activities;Functional mobility training;Stair training;Gait training;Neuromuscular re-education;Patient/family education;Manual techniques;Passive range of motion    PT Next Visit Plan  Nustep, seated and standing LE strengthening, balance activities, gait training with rolling walker.    PT Home Exercise Plan  see patient education section    Consulted and Agree with Plan of Care  Patient       Patient will benefit from skilled therapeutic intervention in order to improve the following deficits and impairments:  Decreased activity tolerance, Decreased balance, Decreased strength, Difficulty walking, Pain, Postural dysfunction  Visit Diagnosis: Unsteadiness on feet  Difficulty in walking, not elsewhere classified  Repeated falls     Problem List Patient Active Problem List   Diagnosis Date Noted  . Frequent falls 01/24/2019  . Hyponatremia 01/10/2019  . Depression, recurrent ([place]) 12/27/2018  . CKD (chronic kidney disease) stage 3, GFR 30-59 ml/min 03/04/2018  . Urinary urgency 01/13/2017  . Cognitive decline 11/27/2016  . Vitamin B 12 deficiency  04/12/2015  . Erectile dysfunction 07/07/2010  . CELLULITIS, RIGHT LEG 04/22/2009  . KNEE, ARTHRITIS, DEGEN./OSTEO 03/25/2009  . SUPERFICIAL PHLEBITIS 12/27/2008  . Hypothyroidism 05/08/2008  . Type 2 diabetes  mellitus, controlled ([place]) 05/08/2008  . Hyperlipidemia 05/08/2008  . Essential hypertension 05/08/2008  . ECZEMA 05/08/2008    Standley Brooking, PTA 01/31/2019, 3:05 PM  [facility] [address] [place], [place], [address] Phone: [phone]   Fax:  [phone]  Name: [name] MRN: [id number] Date of Birth: [birth year]

## 2019-02-02 ENCOUNTER — Other Ambulatory Visit: Payer: Self-pay

## 2019-02-02 ENCOUNTER — Ambulatory Visit: Payer: Medicare HMO | Admitting: Physical Therapy

## 2019-02-02 ENCOUNTER — Encounter: Payer: Self-pay | Admitting: Physical Therapy

## 2019-02-02 DIAGNOSIS — R262 Difficulty in walking, not elsewhere classified: Secondary | ICD-10-CM

## 2019-02-02 DIAGNOSIS — R296 Repeated falls: Secondary | ICD-10-CM

## 2019-02-02 DIAGNOSIS — R2681 Unsteadiness on feet: Secondary | ICD-10-CM | POA: Diagnosis not present

## 2019-02-02 NOTE — Therapy (Signed)
[name], [place], [address] Phone: [phone]   Fax:  [phone]  Physical Therapy Treatment  Patient Details  Name: [name] MRN: [id number] Date of Birth: June 15, 1935 Referring Provider (PT): Carolann Littler, MD   Encounter Date: 02/02/2019  PT End of Session - 02/02/19 1438    Visit Number  5    Number of Visits  12    Date for PT Re-Evaluation  03/07/19    Authorization Type  progress note every 10th visit, KX modifier at 15th visit    PT Start Time  1433    PT Stop Time  1509   limited by fatigue   PT Time Calculation (min)  36 min    Equipment Utilized During Treatment  Other (comment)   FWW   Activity Tolerance  Patient limited by fatigue    Behavior During Therapy  [facility] for tasks assessed/performed       Past Medical History:  Diagnosis Date  . CELLULITIS, RIGHT LEG 04/22/2009  . DM w/o complication type II Q000111Q  . ECZEMA 05/08/2008  . HYPERLIPIDEMIA 05/08/2008  . HYPOTHYROIDISM 05/08/2008  . KNEE, ARTHRITIS, DEGEN./OSTEO 03/25/2009  . SUPERFICIAL PHLEBITIS 12/27/2008  . Unspecified essential hypertension 05/08/2008    Past Surgical History:  Procedure Laterality Date  . CATARACT EXTRACTION  2005   bilateral  . CHOLECYSTECTOMY    . COLONOSCOPY  04-15-2004   tics  . KNEE SURGERY     TKR Dr Alma Friendly 2007  . nasal polyps    . TONSILLECTOMY  1946  . WRIST SURGERY  2015   right side    There were no vitals filed for this visit.  Subjective Assessment - 02/02/19 1437    Subjective  COVID-19 screening performed upon arrival. No complaints upon arrival.    Pertinent History  HTN    Limitations  Standing;Walking;House hold activities    Diagnostic tests  x-ray: normal    Patient Stated Goals  get back to normal with no falls    Currently in Pain?  No/denies         [facility] PT Assessment - 02/02/19 0001      Assessment   Medical Diagnosis  Weakness    Referring Provider (PT)   Carolann Littler, MD    Next MD Visit  01/24/2019    Prior Therapy  no      Precautions   Precautions  Fall                   [facility] Adult PT Treatment/Exercise - 02/02/19 0001      Knee/Hip Exercises: Aerobic   Nustep  L4 x16 min      Knee/Hip Exercises: Seated   Long Arc Quad  AROM;Both;15 reps    Clamshell with TheraBand  Yellow    Other Seated Knee/Hip Exercises  Seated core reaching grey ball x20 reps    Other Seated Knee/Hip Exercises  Seated core D2 withgrey ball x15 reps each    Marching  AROM;Both;20 reps    Hamstring Curl  Strengthening;Both;2 sets;10 reps;Limitations    Hamstring Limitations  yellow theraband    Sit to [place]  with UE support;15 reps                  PT Long Term Goals - 01/17/19 1603      PT LONG TERM GOAL #1   Title  Patient will be independent with HEP    Time  6  Period  Weeks    Status  New      PT LONG TERM GOAL #2   Title  Patient will demonstrate 4+/5 bilateral LE MMT to improve stability during functional tasks.    Time  6    Period  Weeks    Status  New      PT LONG TERM GOAL #3   Title  Patient will perform modified 5x sit to stand test in 18 seconds or less to improve balance and decrease risk of falls.    Time  6    Period  Weeks    Status  New      PT LONG TERM GOAL #4   Title  Patient will ambulate community distances with walker or LRD to safely access his community.    Time  6    Period  Weeks    Status  New            Plan - 02/02/19 1515    Clinical Impression Statement  Patient presented in clinic with only reports of fatigue. Due to patient's lack of core strength, seated exercises completed with a horizontal bolster to reinforce core activation and allow patient to sit in proper posture. No resistance utilized today in order to focus on core strength. Sit > stands demonstrated with greater ease with today at 26" height. Patient limited to only one set for sit <> stands due to LE fatigue.  VCs provided throughout ambulation within clinic for greater step lengths and increased hip flexion to improve foot clearance.    Personal Factors and Comorbidities  Age;Time since onset of injury/illness/exacerbation;Comorbidity 1    Comorbidities  HTN    Examination-Activity Limitations  Stand;Transfers;Locomotion Level    Stability/Clinical Decision Making  Stable/Uncomplicated    Rehab Potential  Good    PT Frequency  2x / week    PT Duration  6 weeks    PT Treatment/Interventions  ADLs/Self Care Home Management;Balance training;Therapeutic exercise;Therapeutic activities;Functional mobility training;Stair training;Gait training;Neuromuscular re-education;Patient/family education;Manual techniques;Passive range of motion    PT Next Visit Plan  Nustep, seated and standing LE strengthening, balance activities, gait training with rolling walker.    PT Home Exercise Plan  see patient education section    Consulted and Agree with Plan of Care  Patient       Patient will benefit from skilled therapeutic intervention in order to improve the following deficits and impairments:  Decreased activity tolerance, Decreased balance, Decreased strength, Difficulty walking, Pain, Postural dysfunction  Visit Diagnosis: Unsteadiness on feet  Difficulty in walking, not elsewhere classified  Repeated falls     Problem List Patient Active Problem List   Diagnosis Date Noted  . Frequent falls 01/24/2019  . Hyponatremia 01/10/2019  . Depression, recurrent ([place]) 12/27/2018  . CKD (chronic kidney disease) stage 3, GFR 30-59 ml/min 03/04/2018  . Urinary urgency 01/13/2017  . Cognitive decline 11/27/2016  . Vitamin B 12 deficiency 04/12/2015  . Erectile dysfunction 07/07/2010  . CELLULITIS, RIGHT LEG 04/22/2009  . KNEE, ARTHRITIS, DEGEN./OSTEO 03/25/2009  . SUPERFICIAL PHLEBITIS 12/27/2008  . Hypothyroidism 05/08/2008  . Type 2 diabetes mellitus, controlled ([place]) 05/08/2008  . Hyperlipidemia  05/08/2008  . Essential hypertension 05/08/2008  . ECZEMA 05/08/2008    Standley Brooking, PTA 02/02/2019, 3:25 PM  [facility] [address] [place], [place], [address] Phone: [phone]   Fax:  [phone]  Name: [name] MRN: [id number] Date of Birth: 1935/11/04

## 2019-02-07 ENCOUNTER — Ambulatory Visit: Payer: Medicare HMO | Admitting: *Deleted

## 2019-02-07 ENCOUNTER — Other Ambulatory Visit: Payer: Self-pay

## 2019-02-07 DIAGNOSIS — R2681 Unsteadiness on feet: Secondary | ICD-10-CM

## 2019-02-07 DIAGNOSIS — R296 Repeated falls: Secondary | ICD-10-CM

## 2019-02-07 DIAGNOSIS — L84 Corns and callosities: Secondary | ICD-10-CM | POA: Diagnosis not present

## 2019-02-07 DIAGNOSIS — R262 Difficulty in walking, not elsewhere classified: Secondary | ICD-10-CM | POA: Diagnosis not present

## 2019-02-07 DIAGNOSIS — B351 Tinea unguium: Secondary | ICD-10-CM | POA: Diagnosis not present

## 2019-02-07 DIAGNOSIS — E1151 Type 2 diabetes mellitus with diabetic peripheral angiopathy without gangrene: Secondary | ICD-10-CM | POA: Diagnosis not present

## 2019-02-07 DIAGNOSIS — M79676 Pain in unspecified toe(s): Secondary | ICD-10-CM | POA: Diagnosis not present

## 2019-02-07 NOTE — Therapy (Signed)
[facility] [place], [place], [address] Phone: [phone]   Fax:  [phone]  Physical Therapy Treatment  Patient Details  Name: [name] MRN: [id number] Date of Birth: 1935-03-04 Referring Provider (PT): Carolann Littler, MD   Encounter Date: 02/07/2019  PT End of Session - 02/07/19 1435    Visit Number  6    Number of Visits  12    Date for PT Re-Evaluation  03/07/19    Authorization Type  progress note every 10th visit, KX modifier at 15th visit    PT Start Time  1430    PT Stop Time  1516    PT Time Calculation (min)  46 min       Past Medical History:  Diagnosis Date  . CELLULITIS, RIGHT LEG 04/22/2009  . DM w/o complication type II [id number]  . ECZEMA 05/08/2008  . HYPERLIPIDEMIA 05/08/2008  . HYPOTHYROIDISM 05/08/2008  . KNEE, ARTHRITIS, DEGEN./OSTEO 03/25/2009  . SUPERFICIAL PHLEBITIS 12/27/2008  . Unspecified essential hypertension 05/08/2008    Past Surgical History:  Procedure Laterality Date  . CATARACT EXTRACTION  2005   bilateral  . CHOLECYSTECTOMY    . COLONOSCOPY  04-15-2004   tics  . KNEE SURGERY     TKR Dr Alma Friendly 2007  . nasal polyps    . TONSILLECTOMY  1946  . WRIST SURGERY  2015   right side    There were no vitals filed for this visit.  Subjective Assessment - 02/07/19 1432    Subjective  COVID-19 screening performed upon arrival. No complaints upon arrival.    Pertinent History  HTN    Limitations  Standing;Walking;House hold activities    Diagnostic tests  x-ray: normal    Patient Stated Goals  get back to normal with no falls    Currently in Pain?  No/denies    Pain Location  Back    Pain Orientation  Lower                       [facility] Adult PT Treatment/Exercise - 02/07/19 0001      Knee/Hip Exercises: Aerobic   Nustep  L4 x16 min seat 12      Knee/Hip Exercises: Standing   Forward Step Up  Both;2 sets;10 reps;Step Height: 4";Hand Hold: 2    Rocker  Board  3 minutes   PF/DF     Knee/Hip Exercises: Seated   Long Arc Quad  AROM;Both;20 reps;10 reps    Long Arc Quad Weight  3 lbs.    Ball Squeeze  --    Clamshell with TheraBand  --    Other Seated Knee/Hip Exercises  Seated core reaching  2#ball x30 reps 10 diagonals each side and forward on mat    Sit to Sand  with UE support;20 reps   constant verbal and tactile cuing for technique to fin his b                 PT Long Term Goals - 01/17/19 1603      PT LONG TERM GOAL #1   Title  Patient will be independent with HEP    Time  6    Period  Weeks    Status  New      PT LONG TERM GOAL #2   Title  Patient will demonstrate 4+/5 bilateral LE MMT to improve stability during functional tasks.    Time  6    Period  Weeks  Status  New      PT LONG TERM GOAL #3   Title  Patient will perform modified 5x sit to stand test in 18 seconds or less to improve balance and decrease risk of falls.    Time  6    Period  Weeks    Status  New      PT LONG TERM GOAL #4   Title  Patient will ambulate community distances with walker or LRD to safely access his community.    Time  6    Period  Weeks    Status  New            Plan - 02/07/19 1551    Clinical Impression Statement  Pt arrived today ambulating with FWW. He was guided through LE strengthening exs in // bars with constant  verbal cues needed for technique. Both UEs needed today during step ups. Pt did better with sit to stand after getting his COG over his feet and not leaning back, but needs constANT VERBAL AND TACTILE CUING.    Personal Factors and Comorbidities  Age;Time since onset of injury/illness/exacerbation;Comorbidity 1    Stability/Clinical Decision Making  Stable/Uncomplicated    Rehab Potential  Good    PT Frequency  2x / week    PT Duration  6 weeks    PT Treatment/Interventions  ADLs/Self Care Home Management;Balance training;Therapeutic exercise;Therapeutic activities;Functional mobility  training;Stair training;Gait training;Neuromuscular re-education;Patient/family education;Manual techniques;Passive range of motion    PT Next Visit Plan  Nustep, seated and standing LE strengthening, balance activities, gait training with rolling walker.    Consulted and Agree with Plan of Care  Patient       Patient will benefit from skilled therapeutic intervention in order to improve the following deficits and impairments:  Decreased activity tolerance, Decreased balance, Decreased strength, Difficulty walking, Pain, Postural dysfunction  Visit Diagnosis: Unsteadiness on feet  Difficulty in walking, not elsewhere classified  Repeated falls     Problem List Patient Active Problem List   Diagnosis Date Noted  . Frequent falls 01/24/2019  . Hyponatremia 01/10/2019  . Depression, recurrent ([place]) 12/27/2018  . CKD (chronic kidney disease) stage 3, GFR 30-59 ml/min 03/04/2018  . Urinary urgency 01/13/2017  . Cognitive decline 11/27/2016  . Vitamin B 12 deficiency 04/12/2015  . Erectile dysfunction 07/07/2010  . CELLULITIS, RIGHT LEG 04/22/2009  . KNEE, ARTHRITIS, DEGEN./OSTEO 03/25/2009  . SUPERFICIAL PHLEBITIS 12/27/2008  . Hypothyroidism 05/08/2008  . Type 2 diabetes mellitus, controlled ([place]) 05/08/2008  . Hyperlipidemia 05/08/2008  . Essential hypertension 05/08/2008  . ECZEMA 05/08/2008    Ysidra Sopher,CHRIS,  PTA 02/07/2019, 4:49 PM  [facility] [address] [place], [place], [address] Phone: [phone]   Fax:  [phone]  Name: [name] MRN: [id number] Date of Birth: 06-04-1935

## 2019-02-08 ENCOUNTER — Encounter: Payer: Self-pay | Admitting: Family Medicine

## 2019-02-08 ENCOUNTER — Telehealth (INDEPENDENT_AMBULATORY_CARE_PROVIDER_SITE_OTHER): Payer: Medicare HMO | Admitting: Family Medicine

## 2019-02-08 DIAGNOSIS — R296 Repeated falls: Secondary | ICD-10-CM

## 2019-02-08 DIAGNOSIS — R454 Irritability and anger: Secondary | ICD-10-CM

## 2019-02-08 NOTE — Progress Notes (Signed)
This visit type was conducted due to national recommendations for restrictions regarding the COVID-19 pandemic in an effort to limit this patient's exposure and mitigate transmission in our community.   Virtual Visit via Telephone Note  I connected with [name] on 02/08/19 at  3:15 PM EST by telephone and verified that I am speaking with the correct person using two identifiers.   I discussed the limitations, risks, security and privacy concerns of performing an evaluation and management service by telephone and the availability of in person appointments. I also discussed with the patient that there may be a patient responsible charge related to this service. The patient expressed understanding and agreed to proceed.  Location patient: home Location provider: work or home office Participants present for the call: patient, provider, and patient's wife. Patient did not have a visit in the prior 7 days to address this/these issue(s).   History of Present Illness: [name] has chronic problems including type 2 diabetes, hypothyroidism, hyperlipidemia, hypertension, recurrent depression, B12 deficiency, cognitive impairment, and chronic kidney disease.  He had complained of some depressive symptoms back in October and had PHQ 9 score of 9.  We started low-dose Wellbutrin XL 150 mg once daily.  He was also complaining of low energy and low initiative and low motivation.  Wife thinks he has had some improvement in those symptoms but she has noted now for the past several weeks he seems more "moody "especially at night and also increased irritability and negativity.  She started reading up about Wellbutrin and was concerned this may be a side effect.  He has not had any suicidal ideation.  He has had some recent balance issues and is currently engaged in physical therapy.  Past Medical History:  Diagnosis Date  . CELLULITIS, RIGHT LEG 04/22/2009  . DM w/o complication type II [id number]  . ECZEMA  05/08/2008  . HYPERLIPIDEMIA 05/08/2008  . HYPOTHYROIDISM 05/08/2008  . KNEE, ARTHRITIS, DEGEN./OSTEO 03/25/2009  . SUPERFICIAL PHLEBITIS 12/27/2008  . Unspecified essential hypertension 05/08/2008   Past Surgical History:  Procedure Laterality Date  . CATARACT EXTRACTION  2005   bilateral  . CHOLECYSTECTOMY    . COLONOSCOPY  04-15-2004   tics  . KNEE SURGERY     TKR Dr Alma Friendly 2007  . nasal polyps    . TONSILLECTOMY  1946  . WRIST SURGERY  2015   right side    reports that he has never smoked. He has never used smokeless tobacco. He reports that he does not drink alcohol or use drugs. family history includes Arthritis in an other family member; Heart disease in an other family member; Hypertension in an other family member. No Known Allergies    Observations/Objective: Patient sounds cheerful and well on the phone. I do not appreciate any SOB. Speech and thought processing are grossly intact. Patient reported vitals:  Assessment and Plan:  #1 mood disorder with irritability and mild nighttime agitation.  Not clear whether this could be related to the Wellbutrin vs cognitive decline vs other.  It is also not clear whether this has helped much with his depression  -We decided to stop Wellbutrin at this time to see how he does. -If wife thinks his depression is worsening with coming off Wellbutrin consider other options such as possibly Prozac  #2 history of frequent falls and increased fall risk  -Continue outpatient physical therapy  Follow Up Instructions:  -As scheduled   99441 5-10 99442 11-20 99443 21-30 I did not refer this patient  for an OV in the next 24 hours for this/these issue(s).  I discussed the assessment and treatment plan with the patient. The patient was provided an opportunity to ask questions and all were answered. The patient agreed with the plan and demonstrated an understanding of the instructions.   The patient was advised to call back or seek an  in-person evaluation if the symptoms worsen or if the condition fails to improve as anticipated.  I provided 22 minutes of non-face-to-face time during this encounter.   Carolann Littler, MD

## 2019-02-09 ENCOUNTER — Encounter: Payer: Self-pay | Admitting: Physical Therapy

## 2019-02-09 ENCOUNTER — Other Ambulatory Visit: Payer: Self-pay

## 2019-02-09 ENCOUNTER — Ambulatory Visit: Payer: Medicare HMO | Admitting: Physical Therapy

## 2019-02-09 DIAGNOSIS — R296 Repeated falls: Secondary | ICD-10-CM

## 2019-02-09 DIAGNOSIS — R2681 Unsteadiness on feet: Secondary | ICD-10-CM

## 2019-02-09 DIAGNOSIS — R262 Difficulty in walking, not elsewhere classified: Secondary | ICD-10-CM | POA: Diagnosis not present

## 2019-02-09 DIAGNOSIS — N3941 Urge incontinence: Secondary | ICD-10-CM | POA: Diagnosis not present

## 2019-02-09 NOTE — Therapy (Signed)
[facility] [place], [place], [address] Phone: [phone]   Fax:  [phone]  Physical Therapy Treatment  Patient Details  Name: [name] MRN: [id number] Date of Birth: [birth year] Referring Provider (PT): Carolann Littler, MD   Encounter Date: 02/09/2019  PT End of Session - 02/09/19 1524    Visit Number  7    Number of Visits  12    Date for PT Re-Evaluation  03/07/19    Authorization Type  progress note every 10th visit, KX modifier at 15th visit    PT Start Time  1437    PT Stop Time  1515   limited by fatigue and late arrival   PT Time Calculation (min)  38 min    Equipment Utilized During Treatment  Other (comment)   FWW   Activity Tolerance  Patient limited by fatigue    Behavior During Therapy  [facility] for tasks assessed/performed       Past Medical History:  Diagnosis Date  . CELLULITIS, RIGHT LEG 04/22/2009  . DM w/o complication type II Q000111Q  . ECZEMA 05/08/2008  . HYPERLIPIDEMIA 05/08/2008  . HYPOTHYROIDISM 05/08/2008  . KNEE, ARTHRITIS, DEGEN./OSTEO 03/25/2009  . SUPERFICIAL PHLEBITIS 12/27/2008  . Unspecified essential hypertension 05/08/2008    Past Surgical History:  Procedure Laterality Date  . CATARACT EXTRACTION  2005   bilateral  . CHOLECYSTECTOMY    . COLONOSCOPY  04-15-2004   tics  . KNEE SURGERY     TKR Dr Alma Friendly 2007  . nasal polyps    . TONSILLECTOMY  1946  . WRIST SURGERY  2015   right side    There were no vitals filed for this visit.  Subjective Assessment - 02/09/19 1523    Subjective  COVID-19 screening performed upon arrival. Reports staggering a lot yesterday and felt like his legs would give out under him.    Pertinent History  HTN    Limitations  Standing;Walking;House hold activities    Diagnostic tests  x-ray: normal    Patient Stated Goals  get back to normal with no falls    Currently in Pain?  No/denies         [facility] PT Assessment - 02/09/19 0001       Assessment   Medical Diagnosis  Weakness    Referring Provider (PT)  Carolann Littler, MD    Next MD Visit  01/24/2019    Prior Therapy  no      Precautions   Precautions  Fall                   [facility] Adult PT Treatment/Exercise - 02/09/19 0001      Knee/Hip Exercises: Aerobic   Nustep  L2 x18 min      Knee/Hip Exercises: Seated   Long Arc Quad  AROM;Both;3 sets;10 reps;Weights    Long Arc Quad Weight  2 lbs.    Ball Squeeze  x20 reps 5 sec hold    Clamshell with TheraBand  Green   x20 reps   Other Seated Knee/Hip Exercises  Seated core reaching  2#ball x20 reps 10 diagonals each side and forward on mat    Marching  Strengthening;Both;2 sets;10 reps;Limitations    Marching Limitations  green theraband    Hamstring Curl  Strengthening;Both;3 sets;10 reps;Limitations    Hamstring Limitations  green theraband    Sit to Sand  5 reps;with UE support  PT Long Term Goals - 01/17/19 1603      PT LONG TERM GOAL #1   Title  Patient will be independent with HEP    Time  6    Period  Weeks    Status  New      PT LONG TERM GOAL #2   Title  Patient will demonstrate 4+/5 bilateral LE MMT to improve stability during functional tasks.    Time  6    Period  Weeks    Status  New      PT LONG TERM GOAL #3   Title  Patient will perform modified 5x sit to stand test in 18 seconds or less to improve balance and decrease risk of falls.    Time  6    Period  Weeks    Status  New      PT LONG TERM GOAL #4   Title  Patient will ambulate community distances with walker or LRD to safely access his community.    Time  6    Period  Weeks    Status  New            Plan - 02/09/19 1525    Clinical Impression Statement  Patient arrived in clinic with reports of more instabillity and staggering yesterday while ambulating. Patient reports LE weakness with ambulating at times. Patient able to complete therex although requiring more VCs to improve  technique. Horizontal bolster applied to chair in order to reduce posterior lean in sitting during therex and increase core activation.    Personal Factors and Comorbidities  Age;Time since onset of injury/illness/exacerbation;Comorbidity 1    Comorbidities  HTN    Examination-Activity Limitations  Stand;Transfers;Locomotion Level    Stability/Clinical Decision Making  Stable/Uncomplicated    Rehab Potential  Good    PT Frequency  2x / week    PT Duration  6 weeks    PT Treatment/Interventions  ADLs/Self Care Home Management;Balance training;Therapeutic exercise;Therapeutic activities;Functional mobility training;Stair training;Gait training;Neuromuscular re-education;Patient/family education;Manual techniques;Passive range of motion    PT Next Visit Plan  Nustep, seated and standing LE strengthening, balance activities, gait training with rolling walker.    PT Home Exercise Plan  see patient education section    Consulted and Agree with Plan of Care  Patient       Patient will benefit from skilled therapeutic intervention in order to improve the following deficits and impairments:  Decreased activity tolerance, Decreased balance, Decreased strength, Difficulty walking, Pain, Postural dysfunction  Visit Diagnosis: Unsteadiness on feet  Difficulty in walking, not elsewhere classified  Repeated falls     Problem List Patient Active Problem List   Diagnosis Date Noted  . Frequent falls 01/24/2019  . Hyponatremia 01/10/2019  . Depression, recurrent ([place]) 12/27/2018  . CKD (chronic kidney disease) stage 3, GFR 30-59 ml/min 03/04/2018  . Urinary urgency 01/13/2017  . Cognitive decline 11/27/2016  . Vitamin B 12 deficiency 04/12/2015  . Erectile dysfunction 07/07/2010  . CELLULITIS, RIGHT LEG 04/22/2009  . KNEE, ARTHRITIS, DEGEN./OSTEO 03/25/2009  . SUPERFICIAL PHLEBITIS 12/27/2008  . Hypothyroidism 05/08/2008  . Type 2 diabetes mellitus, controlled ([place]) 05/08/2008  .  Hyperlipidemia 05/08/2008  . Essential hypertension 05/08/2008  . ECZEMA 05/08/2008    Standley Brooking, PTA 02/09/2019, 4:54 PM  [facility] [address] [place], [place], [address] Phone: [phone]   Fax:  [phone]  Name: AIJALON WINGETT MRN: [id number] Date of Birth: Sep 30, 1935

## 2019-02-14 ENCOUNTER — Encounter: Payer: Self-pay | Admitting: Physical Therapy

## 2019-02-14 ENCOUNTER — Other Ambulatory Visit: Payer: Self-pay

## 2019-02-14 ENCOUNTER — Ambulatory Visit: Payer: Medicare HMO | Admitting: Physical Therapy

## 2019-02-14 DIAGNOSIS — R2681 Unsteadiness on feet: Secondary | ICD-10-CM | POA: Diagnosis not present

## 2019-02-14 DIAGNOSIS — R296 Repeated falls: Secondary | ICD-10-CM

## 2019-02-14 DIAGNOSIS — R262 Difficulty in walking, not elsewhere classified: Secondary | ICD-10-CM

## 2019-02-14 NOTE — Therapy (Addendum)
[facility] [place], [place], [address] Phone: [phone]   Fax:  [phone]  Physical Therapy Treatment  Patient Details  Name: [name] MRN: [id number] Date of Birth: [birth year] Referring Provider (PT): Carolann Littler, MD   Encounter Date: 02/14/2019  PT End of Session - 02/14/19 1433    Visit Number  8    Number of Visits  12    Date for PT Re-Evaluation  03/07/19    Authorization Type  progress note every 10th visit, KX modifier at 15th visit    PT Start Time  1432    PT Stop Time  1513    PT Time Calculation (min)  41 min    Equipment Utilized During Treatment  Other (comment)   FWW   Activity Tolerance  Patient limited by fatigue    Behavior During Therapy  [facility] for tasks assessed/performed       Past Medical History:  Diagnosis Date  . CELLULITIS, RIGHT LEG 04/22/2009  . DM w/o complication type II [id number]  . ECZEMA 05/08/2008  . HYPERLIPIDEMIA 05/08/2008  . HYPOTHYROIDISM 05/08/2008  . KNEE, ARTHRITIS, DEGEN./OSTEO 03/25/2009  . SUPERFICIAL PHLEBITIS 12/27/2008  . Unspecified essential hypertension 05/08/2008    Past Surgical History:  Procedure Laterality Date  . CATARACT EXTRACTION  2005   bilateral  . CHOLECYSTECTOMY    . COLONOSCOPY  04-15-2004   tics  . KNEE SURGERY     TKR Dr Alma Friendly 2007  . nasal polyps    . TONSILLECTOMY  1946  . WRIST SURGERY  2015   right side    There were no vitals filed for this visit.  Subjective Assessment - 02/14/19 1432    Subjective  COVID-19 screening performed upon arrival. No complaints upon arrival. Reports feeling good for 83 years old.    Pertinent History  HTN    Limitations  Standing;Walking;House hold activities    Diagnostic tests  x-ray: normal    Patient Stated Goals  get back to normal with no falls    Currently in Pain?  No/denies         [facility] PT Assessment - 02/14/19 0001      Assessment   Medical Diagnosis  Weakness    Referring Provider (PT)  Carolann Littler, MD    Next MD Visit  01/24/2019    Prior Therapy  no      Precautions   Precautions  Fall                   [facility] Adult PT Treatment/Exercise - 02/14/19 0001      Knee/Hip Exercises: Aerobic   Nustep  L4 x17 min      Knee/Hip Exercises: Seated   Long Arc Quad  Strengthening;Both;2 sets;10 reps;Weights    Long Arc Quad Weight  3 lbs.    Ball Squeeze  x20 reps 5 sec hold    Clamshell with TheraBand  Yellow   x20 reps   Other Seated Knee/Hip Exercises  Seated core reachouts 4# x20 reps, 4# lifts x20 reps; B heel/toe raise x20 reps each    Other Seated Knee/Hip Exercises  Seated core D2 with 4# x20 reps each;  yellow theraband rows x20 reps    Marching  Strengthening;Both;2 sets;10 reps;Weights    Marching Limitations  3    Hamstring Curl  Strengthening;Both;3 sets;10 reps;Limitations    Hamstring Limitations  yellow therband    Sit to Sand  15 reps;with UE support  5 reps within 18 sec                 PT Long Term Goals - 01/17/19 1603      PT LONG TERM GOAL #1   Title  Patient will be independent with HEP    Time  6    Period  Weeks    Status  New      PT LONG TERM GOAL #2   Title  Patient will demonstrate 4+/5 bilateral LE MMT to improve stability during functional tasks.    Time  6    Period  Weeks    Status  New      PT LONG TERM GOAL #3   Title  Patient will perform modified 5x sit to stand test in 18 seconds or less to improve balance and decrease risk of falls.    Time  6    Period  Weeks    Status  New      PT LONG TERM GOAL #4   Title  Patient will ambulate community distances with walker or LRD to safely access his community.    Time  6    Period  Weeks    Status  New            Plan - 02/14/19 1525    Clinical Impression Statement  Patient presented in clinic with no new complaints regarding falls or unstable gait. Patient able to complete more therex with less fatigue notable.  Patient limited more with R LAQ today due to weakness per patient report. Patient able to achieve LTG of sit <> stands as he can complete 5 reps within 18 sec. Excessive edema present in RLE from R knee to R ankle which patient reported was not abnormal.    Personal Factors and Comorbidities  Age;Time since onset of injury/illness/exacerbation;Comorbidity 1    Comorbidities  HTN    Examination-Activity Limitations  Stand;Transfers;Locomotion Level    Stability/Clinical Decision Making  Stable/Uncomplicated    Rehab Potential  Good    PT Frequency  2x / week    PT Duration  6 weeks    PT Treatment/Interventions  ADLs/Self Care Home Management;Balance training;Therapeutic exercise;Therapeutic activities;Functional mobility training;Stair training;Gait training;Neuromuscular re-education;Patient/family education;Manual techniques;Passive range of motion    PT Next Visit Plan  Nustep, seated and standing LE strengthening, balance activities, gait training with rolling walker.    PT Home Exercise Plan  see patient education section    Consulted and Agree with Plan of Care  Patient       Patient will benefit from skilled therapeutic intervention in order to improve the following deficits and impairments:  Decreased activity tolerance, Decreased balance, Decreased strength, Difficulty walking, Pain, Postural dysfunction  Visit Diagnosis: Unsteadiness on feet  Difficulty in walking, not elsewhere classified  Repeated falls     Problem List Patient Active Problem List   Diagnosis Date Noted  . Frequent falls 01/24/2019  . Hyponatremia 01/10/2019  . Depression, recurrent ([place]) 12/27/2018  . CKD (chronic kidney disease) stage 3, GFR 30-59 ml/min 03/04/2018  . Urinary urgency 01/13/2017  . Cognitive decline 11/27/2016  . Vitamin B 12 deficiency 04/12/2015  . Erectile dysfunction 07/07/2010  . CELLULITIS, RIGHT LEG 04/22/2009  . KNEE, ARTHRITIS, DEGEN./OSTEO 03/25/2009  . SUPERFICIAL  PHLEBITIS 12/27/2008  . Hypothyroidism 05/08/2008  . Type 2 diabetes mellitus, controlled ([place]) 05/08/2008  . Hyperlipidemia 05/08/2008  . Essential hypertension 05/08/2008  . ECZEMA 05/08/2008    Standley Brooking, PTA 02/14/2019,  4:24 PM  [facility] [place], [place], [address] Phone: [phone]   Fax:  [phone]  Name: [name] MRN: [id number] Date of Birth: 1936/01/30

## 2019-02-21 ENCOUNTER — Other Ambulatory Visit: Payer: Self-pay

## 2019-02-21 ENCOUNTER — Ambulatory Visit: Payer: Medicare HMO | Admitting: Physical Therapy

## 2019-02-21 ENCOUNTER — Encounter: Payer: Self-pay | Admitting: Physical Therapy

## 2019-02-21 DIAGNOSIS — R2681 Unsteadiness on feet: Secondary | ICD-10-CM | POA: Diagnosis not present

## 2019-02-21 DIAGNOSIS — R262 Difficulty in walking, not elsewhere classified: Secondary | ICD-10-CM

## 2019-02-21 DIAGNOSIS — R296 Repeated falls: Secondary | ICD-10-CM | POA: Diagnosis not present

## 2019-02-21 NOTE — Therapy (Signed)
[facility] [place], [place], [address] Phone: [phone]   Fax:  [phone]  Physical Therapy Treatment  Patient Details  Name: [name] MRN: [id number] Date of Birth: October 09, 1935 Referring Provider (PT): Carolann Littler, MD   Encounter Date: 02/21/2019  PT End of Session - 02/21/19 1453    Visit Number  9    Number of Visits  12    Date for PT Re-Evaluation  03/07/19    Authorization Type  progress note every 10th visit, KX modifier at 15th visit    PT Start Time  1437    PT Stop Time  1510   limted by fatigue   PT Time Calculation (min)  33 min    Equipment Utilized During Treatment  Other (comment)   FWW   Activity Tolerance  Patient limited by fatigue    Behavior During Therapy  [facility] for tasks assessed/performed       Past Medical History:  Diagnosis Date  . CELLULITIS, RIGHT LEG 04/22/2009  . DM w/o complication type II [id number]  . ECZEMA 05/08/2008  . HYPERLIPIDEMIA 05/08/2008  . HYPOTHYROIDISM 05/08/2008  . KNEE, ARTHRITIS, DEGEN./OSTEO 03/25/2009  . SUPERFICIAL PHLEBITIS 12/27/2008  . Unspecified essential hypertension 05/08/2008    Past Surgical History:  Procedure Laterality Date  . CATARACT EXTRACTION  2005   bilateral  . CHOLECYSTECTOMY    . COLONOSCOPY  04-15-2004   tics  . KNEE SURGERY     TKR Dr Alma Friendly 2007  . nasal polyps    . TONSILLECTOMY  1946  . WRIST SURGERY  2015   right side    There were no vitals filed for this visit.  Subjective Assessment - 02/21/19 1453    Subjective  COVID-19 screening performed upon arrival. No complaints upon arrival.    Pertinent History  HTN    Limitations  Standing;Walking;House hold activities    Diagnostic tests  x-ray: normal    Patient Stated Goals  get back to normal with no falls    Currently in Pain?  No/denies         [facility] PT Assessment - 02/21/19 0001      Assessment   Medical Diagnosis  Weakness    Referring Provider (PT)   Carolann Littler, MD    Prior Therapy  no      Precautions   Precautions  [facility] Adult PT Treatment/Exercise - 02/21/19 0001      Lumbar Exercises: Seated   Other Seated Lumbar Exercises  Row red theraband x20 reps    Other Seated Lumbar Exercises  Seated 4# reachouts x15 reps, B D2 4# ball x15 reps each      Knee/Hip Exercises: Aerobic   Nustep  L3, seat 11 x17 min      Knee/Hip Exercises: Seated   Long Arc Quad  Strengthening;Both;2 sets;10 reps;Weights    Long Arc Quad Weight  3 lbs.    Clamshell with TheraBand  Red   x20 reps   Marching  Strengthening;Both;2 sets;10 reps;Weights    Marching Limitations  3    Hamstring Curl  Strengthening;Both;2 sets;10 reps;Limitations    Hamstring Limitations  red theraband    Sit to Sand  5 reps;with UE support   limited by fatigue                 PT Long Term Goals - 01/17/19  [place] #1   Title  Patient will be independent with HEP    Time  6    Period  Weeks    Status  New      PT LONG TERM GOAL #2   Title  Patient will demonstrate 4+/5 bilateral LE MMT to improve stability during functional tasks.    Time  6    Period  Weeks    Status  New      PT LONG TERM GOAL #3   Title  Patient will perform modified 5x sit to stand test in 18 seconds or less to improve balance and decrease risk of falls.    Time  6    Period  Weeks    Status  New      PT LONG TERM GOAL #4   Title  Patient will ambulate community distances with walker or LRD to safely access his community.    Time  6    Period  Weeks    Status  New            Plan - 02/21/19 1531    Clinical Impression Statement  Patient presented in clinic with reports of general fatigue today. Patient denies using AD at his home. Patient able to demonstrate improved trunk strength and able to sit more erect today. As patient fatigued further, core strength decreased and patient required assist to sit erect.  Limited reps of sit <> stands completed secondary to fatigue.    Personal Factors and Comorbidities  Age;Time since onset of injury/illness/exacerbation;Comorbidity 1    Comorbidities  HTN    Examination-Activity Limitations  Stand;Transfers;Locomotion Level    Stability/Clinical Decision Making  Stable/Uncomplicated    Rehab Potential  Good    PT Frequency  2x / week    PT Duration  6 weeks    PT Treatment/Interventions  ADLs/Self Care Home Management;Balance training;Therapeutic exercise;Therapeutic activities;Functional mobility training;Stair training;Gait training;Neuromuscular re-education;Patient/family education;Manual techniques;Passive range of motion    PT Next Visit Plan  Nustep, seated and standing LE strengthening, balance activities, gait training with rolling walker.    PT Home Exercise Plan  see patient education section    Consulted and Agree with Plan of Care  Patient       Patient will benefit from skilled therapeutic intervention in order to improve the following deficits and impairments:  Decreased activity tolerance, Decreased balance, Decreased strength, Difficulty walking, Pain, Postural dysfunction  Visit Diagnosis: Unsteadiness on feet  Difficulty in walking, not elsewhere classified  Repeated falls     Problem List Patient Active Problem List   Diagnosis Date Noted  . Frequent falls 01/24/2019  . Hyponatremia 01/10/2019  . Depression, recurrent ([place]) 12/27/2018  . CKD (chronic kidney disease) stage 3, GFR 30-59 ml/min 03/04/2018  . Urinary urgency 01/13/2017  . Cognitive decline 11/27/2016  . Vitamin B 12 deficiency 04/12/2015  . Erectile dysfunction 07/07/2010  . CELLULITIS, RIGHT LEG 04/22/2009  . KNEE, ARTHRITIS, DEGEN./OSTEO 03/25/2009  . SUPERFICIAL PHLEBITIS 12/27/2008  . Hypothyroidism 05/08/2008  . Type 2 diabetes mellitus, controlled ([place]) 05/08/2008  . Hyperlipidemia 05/08/2008  . Essential hypertension 05/08/2008  . ECZEMA 05/08/2008     Standley Brooking, PTA 02/21/2019, 3:35 PM  [facility] [address] [place], [place], [address] Phone: [phone]   Fax:  [phone]  Name: [name] MRN: [id number] Date of Birth: 1935-12-29

## 2019-02-28 ENCOUNTER — Ambulatory Visit: Payer: Medicare HMO | Attending: Family Medicine | Admitting: Physical Therapy

## 2019-02-28 ENCOUNTER — Encounter: Payer: Self-pay | Admitting: Family Medicine

## 2019-02-28 ENCOUNTER — Encounter: Payer: Self-pay | Admitting: Physical Therapy

## 2019-02-28 ENCOUNTER — Other Ambulatory Visit: Payer: Self-pay

## 2019-02-28 ENCOUNTER — Ambulatory Visit (INDEPENDENT_AMBULATORY_CARE_PROVIDER_SITE_OTHER): Payer: Medicare HMO | Admitting: Family Medicine

## 2019-02-28 VITALS — BP 114/76 | HR 60 | Temp 97.7°F | Ht 71.0 in | Wt 176.3 lb

## 2019-02-28 DIAGNOSIS — F339 Major depressive disorder, recurrent, unspecified: Secondary | ICD-10-CM | POA: Diagnosis not present

## 2019-02-28 DIAGNOSIS — R296 Repeated falls: Secondary | ICD-10-CM | POA: Insufficient documentation

## 2019-02-28 DIAGNOSIS — R262 Difficulty in walking, not elsewhere classified: Secondary | ICD-10-CM | POA: Diagnosis not present

## 2019-02-28 DIAGNOSIS — R2681 Unsteadiness on feet: Secondary | ICD-10-CM | POA: Diagnosis not present

## 2019-02-28 MED ORDER — ESCITALOPRAM OXALATE 10 MG PO TABS: 10.0000 mg | ORAL_TABLET | Freq: Every day | ORAL | 5 refills | Status: DC

## 2019-02-28 NOTE — Therapy (Signed)
[facility] [place], [place], [address] Phone: [phone]   Fax:  [phone]  Physical Therapy Treatment   Progress Note Reporting Period 01/17/2019 to 02/28/2019  See note below for Objective Data and Assessment of Progress/Goals. Patient's goals are ongoing but patient has made improvements in core strength and seated stability. Gabriela Eves, PT, DPT    Patient Details  Name: [name] MRN: [id number] Date of Birth: February 10, 1936 Referring Provider (PT): Carolann Littler, MD   Encounter Date: 02/28/2019  PT End of Session - 02/28/19 1502    Visit Number  10    Number of Visits  12    Date for PT Re-Evaluation  03/07/19    Authorization Type  progress note every 10th visit, KX modifier at 15th visit    PT Start Time  1430    PT Stop Time  1508   rest breaks provided due to fatigue   PT Time Calculation (min)  38 min    Activity Tolerance  Patient limited by fatigue    Behavior During Therapy  [facility] for tasks assessed/performed       Past Medical History:  Diagnosis Date  . CELLULITIS, RIGHT LEG 04/22/2009  . DM w/o complication type II [id number]  . ECZEMA 05/08/2008  . HYPERLIPIDEMIA 05/08/2008  . HYPOTHYROIDISM 05/08/2008  . KNEE, ARTHRITIS, DEGEN./OSTEO 03/25/2009  . SUPERFICIAL PHLEBITIS 12/27/2008  . Unspecified essential hypertension 05/08/2008    Past Surgical History:  Procedure Laterality Date  . CATARACT EXTRACTION  2005   bilateral  . CHOLECYSTECTOMY    . COLONOSCOPY  04-15-2004   tics  . KNEE SURGERY     TKR Dr Alma Friendly 2007  . nasal polyps    . TONSILLECTOMY  1946  . WRIST SURGERY  2015   right side    There were no vitals filed for this visit.  Subjective Assessment - 02/28/19 1443    Subjective  COVID-19 screening performed upon arrival. Patinet arrives ambulating without an AD    Pertinent History  HTN    Limitations  Standing;Walking;House hold activities    Diagnostic tests   x-ray: normal    Patient Stated Goals  get back to normal with no falls    Currently in Pain?  No/denies         [facility] PT Assessment - 02/28/19 0001      Assessment   Medical Diagnosis  Weakness    Referring Provider (PT)  Carolann Littler, MD    Prior Therapy  no      Precautions   Precautions  [facility] Adult PT Treatment/Exercise - 02/28/19 0001      Lumbar Exercises: Seated   Other Seated Lumbar Exercises  Row red theraband x20 reps    Other Seated Lumbar Exercises  Seated 4# reachouts x15 reps, B D2 4# ball x15 reps each      Knee/Hip Exercises: Aerobic   Nustep  L3, seat 11 x16 min      Knee/Hip Exercises: Seated   Long Arc Quad  Strengthening;Both;2 sets;10 reps;Weights    Long Arc Quad Weight  3 lbs.    Ball Squeeze  x3 minutes5 sec hold    Clamshell with TheraBand  Red   x3 minutes   Marching  Strengthening;Both;2 sets;10 reps;Weights    Marching Limitations  3    Hamstring Curl  Strengthening;Both;2 sets;10 reps;Limitations  Hamstring Limitations  red theraband                  PT Long Term Goals - 02/28/19 1517      PT LONG TERM GOAL #1   Title  Patient will be independent with HEP    Time  6    Period  Weeks    Status  On-going      PT LONG TERM GOAL #2   Title  Patient will demonstrate 4+/5 bilateral LE MMT to improve stability during functional tasks.    Time  6    Period  Weeks    Status  On-going      PT LONG TERM GOAL #3   Title  Patient will perform modified 5x sit to stand test in 18 seconds or less to improve balance and decrease risk of falls.    Time  6    Period  Weeks    Status  On-going   NT     PT LONG TERM GOAL #4   Title  Patient will ambulate community distances with walker or LRD to safely access his community.    Time  6    Period  Weeks    Status  On-going            Plan - 02/28/19 1517    Clinical Impression Statement  Patient arrived ambulating without an AD and  reports feeling "somewhat" confident without it. Patient demonstrated improved trunk strength and control and was able to demonstrate improved carryover of erect seated posture during exercises. Patient noted with ongoing fatigue therefore rests were provided in between TEs. Patient discussed safety during ambulation and was instructed to bring in his cane in next visit for gait training. Patient reported understanding.    Personal Factors and Comorbidities  Age;Time since onset of injury/illness/exacerbation;Comorbidity 1    Comorbidities  HTN    Examination-Activity Limitations  Stand;Transfers;Locomotion Level    Stability/Clinical Decision Making  Stable/Uncomplicated    Clinical Decision Making  Low    Rehab Potential  Good    PT Frequency  2x / week    PT Duration  6 weeks    PT Treatment/Interventions  ADLs/Self Care Home Management;Balance training;Therapeutic exercise;Therapeutic activities;Functional mobility training;Stair training;Gait training;Neuromuscular re-education;Patient/family education;Manual techniques;Passive range of motion    PT Next Visit Plan  Nustep, seated and standing LE strengthening, balance activities, gait training with rolling walker.    PT Home Exercise Plan  see patient education section    Consulted and Agree with Plan of Care  Patient       Patient will benefit from skilled therapeutic intervention in order to improve the following deficits and impairments:  Decreased activity tolerance, Decreased balance, Decreased strength, Difficulty walking, Pain, Postural dysfunction  Visit Diagnosis: Unsteadiness on feet  Difficulty in walking, not elsewhere classified  Repeated falls     Problem List Patient Active Problem List   Diagnosis Date Noted  . Frequent falls 01/24/2019  . Hyponatremia 01/10/2019  . Depression, recurrent ([place]) 12/27/2018  . CKD (chronic kidney disease) stage 3, GFR 30-59 ml/min 03/04/2018  . Urinary urgency 01/13/2017  .  Cognitive decline 11/27/2016  . Vitamin B 12 deficiency 04/12/2015  . Erectile dysfunction 07/07/2010  . CELLULITIS, RIGHT LEG 04/22/2009  . KNEE, ARTHRITIS, DEGEN./OSTEO 03/25/2009  . SUPERFICIAL PHLEBITIS 12/27/2008  . Hypothyroidism 05/08/2008  . Type 2 diabetes mellitus, controlled ([place]) 05/08/2008  . Hyperlipidemia 05/08/2008  . Essential hypertension 05/08/2008  . ECZEMA 05/08/2008  Gabriela Eves, PT, DPT 02/28/2019, 3:23 PM  [facility] [address] [place], [place], [address] Phone: [phone]   Fax:  [phone]  Name: [name] MRN: [id number] Date of Birth: 1935/08/29

## 2019-02-28 NOTE — Progress Notes (Signed)
  Subjective:     Patient ID: [name], male   DOB: 1935-10-06, 84 y.o.   MRN: [id number]  HPI [name] is seen for follow-up.  Refer to multiple recent notes.  He did have some balance issues that basically quit walking without assistance with a walker or cane.  We set him up with some outpatient physical therapy and has had dramatic improvement.  He is walking now unassisted.  He has been much more active.  He is also gained about 4 pounds since last visit.  He had decreased appetite for some time.  Wife thought Wellbutrin may be making things worse.  He stopped the Wellbutrin.  Unfortunately, he is having some agitation at night.  Frequently around 3 AM he has insomnia and has been agitated at times and even some occasional paranoid type behavior.  He does have cognitive impairment is on Aricept.  He has depressed mood.  Difficulty concentrating.  Feels down and depressed frequently.  No suicidal ideation.  We discussed possible change in antidepressant medication.    Past Medical History:  Diagnosis Date  . CELLULITIS, RIGHT LEG 04/22/2009  . DM w/o complication type II Q000111Q  . ECZEMA 05/08/2008  . HYPERLIPIDEMIA 05/08/2008  . HYPOTHYROIDISM 05/08/2008  . KNEE, ARTHRITIS, DEGEN./OSTEO 03/25/2009  . SUPERFICIAL PHLEBITIS 12/27/2008  . Unspecified essential hypertension 05/08/2008   Past Surgical History:  Procedure Laterality Date  . CATARACT EXTRACTION  2005   bilateral  . CHOLECYSTECTOMY    . COLONOSCOPY  04-15-2004   tics  . KNEE SURGERY     TKR Dr Alma Friendly 2007  . nasal polyps    . TONSILLECTOMY  1946  . WRIST SURGERY  2015   right side    reports that he has never smoked. He has never used smokeless tobacco. He reports that he does not drink alcohol or use drugs. family history includes Arthritis in an other family member; Heart disease in an other family member; Hypertension in an other family member. No Known Allergies   Review of Systems  Constitutional: Negative for  appetite change, chills, fever and unexpected weight change.  Psychiatric/Behavioral: Positive for agitation, dysphoric mood and sleep disturbance. Negative for suicidal ideas.       Objective:   Physical Exam Constitutional:      Appearance: Normal appearance.  Cardiovascular:     Rate and Rhythm: Normal rate and regular rhythm.  Pulmonary:     Effort: Pulmonary effort is normal.     Breath sounds: Normal breath sounds.  Neurological:     Mental Status: He is alert.  Psychiatric:     Comments: PHQ-9 equals 18        Assessment:     #1 recent weight loss which is stabilized and now improving with 4 pound weight gain since last visit  #2 cognitive impairment with suspected early dementia  #3 increased depressive symptoms.  No suicidal ideation    Plan:     -Start Lexapro 10 mg once daily -Bring back to reassess in 1 month -Continue outpatient physical therapy -Check A1c at follow-up in 1 month  [name] MD [facility]

## 2019-02-28 NOTE — Patient Instructions (Signed)
Living With Depression Everyone experiences occasional disappointment, sadness, and loss in their lives. When you are feeling down, blue, or sad for at least 2 weeks in a row, it may mean that you have depression. Depression can affect your thoughts and feelings, relationships, daily activities, and physical health. It is caused by changes in the way your brain functions. If you receive a diagnosis of depression, your health care provider will tell you which type of depression you have and what treatment options are available to you. If you are living with depression, there are ways to help you recover from it and also ways to prevent it from coming back. How to cope with lifestyle changes Coping with stress     Stress is your body's reaction to life changes and events, both good and bad. Stressful situations may include:  Getting married.  The death of a spouse.  Losing a job.  Retiring.  Having a baby. Stress can last just a few hours or it can be ongoing. Stress can play a major role in depression, so it is important to learn both how to cope with stress and how to think about it differently. Talk with your health care provider or a counselor if you would like to learn more about stress reduction. He or she may suggest some stress reduction techniques, such as:  Music therapy. This can include creating music or listening to music. Choose music that you enjoy and that inspires you.  Mindfulness-based meditation. This kind of meditation can be done while sitting or walking. It involves being aware of your normal breaths, rather than trying to control your breathing.  Centering prayer. This is a kind of meditation that involves focusing on a spiritual word or phrase. Choose a word, phrase, or sacred image that is meaningful to you and that brings you peace.  Deep breathing. To do this, expand your stomach and inhale slowly through your nose. Hold your breath for 3-5 seconds, then exhale  slowly, allowing your stomach muscles to relax.  Muscle relaxation. This involves intentionally tensing muscles then relaxing them. Choose a stress reduction technique that fits your lifestyle and personality. Stress reduction techniques take time and practice to develop. Set aside 5-15 minutes a day to do them. Therapists can offer training in these techniques. The training may be covered by some insurance plans. Other things you can do to manage stress include:  Keeping a stress diary. This can help you learn what triggers your stress and ways to control your response.  Understanding what your limits are and saying no to requests or events that lead to a schedule that is too full.  Thinking about how you respond to certain situations. You may not be able to control everything, but you can control how you react.  Adding humor to your life by watching funny films or TV shows.  Making time for activities that help you relax and not feeling guilty about spending your time this way.  Medicines Your health care provider may suggest certain medicines if he or she feels that they will help improve your condition. Avoid using alcohol and other substances that may prevent your medicines from working properly (may interact). It is also important to:  Talk with your pharmacist or health care provider about all the medicines that you take, their possible side effects, and what medicines are safe to take together.  Make it your goal to take part in all treatment decisions (shared decision-making). This includes giving input on   the side effects of medicines. It is best if shared decision-making with your health care provider is part of your total treatment plan. If your health care provider prescribes a medicine, you may not notice the full benefits of it for 4-8 weeks. Most people who are treated for depression need to be on medicine for at least 6-12 months after they feel better. If you are taking  medicines as part of your treatment, do not stop taking medicines without first talking to your health care provider. You may need to have the medicine slowly decreased (tapered) over time to decrease the risk of harmful side effects. Relationships Your health care provider may suggest family therapy along with individual therapy and drug therapy. While there may not be family problems that are causing you to feel depressed, it is still important to make sure your family learns as much as they can about your mental health. Having your family's support can help make your treatment successful. How to recognize changes in your condition Everyone has a different response to treatment for depression. Recovery from major depression happens when you have not had signs of major depression for two months. This may mean that you will start to:  Have more interest in doing activities.  Feel less hopeless than you did 2 months ago.  Have more energy.  Overeat less often, or have better or improving appetite.  Have better concentration. Your health care provider will work with you to decide the next steps in your recovery. It is also important to recognize when your condition is getting worse. Watch for these signs:  Having fatigue or low energy.  Eating too much or too little.  Sleeping too much or too little.  Feeling restless, agitated, or hopeless.  Having trouble concentrating or making decisions.  Having unexplained physical complaints.  Feeling irritable, angry, or aggressive. Get help as soon as you or your family members notice these symptoms coming back. How to get support and help from others How to talk with friends and family members about your condition  Talking to friends and family members about your condition can provide you with one way to get support and guidance. Reach out to trusted friends or family members, explain your symptoms to them, and let them know that you are  working with a health care provider to treat your depression. Financial resources Not all insurance plans cover mental health care, so it is important to check with your insurance carrier. If paying for co-pays or counseling services is a problem, search for a local or county mental health care center. They may be able to offer public mental health care services at low or no cost when you are not able to see a private health care provider. If you are taking medicine for depression, you may be able to get the generic form, which may be less expensive. Some makers of prescription medicines also offer help to patients who cannot afford the medicines they need. Follow these instructions at home:   Get the right amount and quality of sleep.  Cut down on using caffeine, tobacco, alcohol, and other potentially harmful substances.  Try to exercise, such as walking or lifting small weights.  Take over-the-counter and prescription medicines only as told by your health care provider.  Eat a healthy diet that includes plenty of vegetables, fruits, whole grains, low-fat dairy products, and lean protein. Do not eat a lot of foods that are high in solid fats, added sugars, or salt.    Keep all follow-up visits as told by your health care provider. This is important. Contact a health care provider if:  You stop taking your antidepressant medicines, and you have any of these symptoms: ? Nausea. ? Headache. ? Feeling lightheaded. ? Chills and body aches. ? Not being able to sleep (insomnia).  You or your friends and family think your depression is getting worse. Get help right away if:  You have thoughts of hurting yourself or others. If you ever feel like you may hurt yourself or others, or have thoughts about taking your own life, get help right away. You can go to your nearest emergency department or call:  Your local emergency services (911 in the [place]  A suicide crisis helpline, such as the  National Suicide Prevention Lifeline at 1-800-273-8255. This is open 24-hours a day. Summary  If you are living with depression, there are ways to help you recover from it and also ways to prevent it from coming back.  Work with your health care team to create a management plan that includes counseling, stress management techniques, and healthy lifestyle habits. This information is not intended to replace advice given to you by your health care provider. Make sure you discuss any questions you have with your health care provider. Document Revised: 06/03/2018 Document Reviewed: 01/13/2016 Elsevier Patient Education  2020 [employer]  

## 2019-03-02 ENCOUNTER — Encounter: Payer: Self-pay | Admitting: Physical Therapy

## 2019-03-02 ENCOUNTER — Other Ambulatory Visit: Payer: Self-pay

## 2019-03-02 ENCOUNTER — Ambulatory Visit: Payer: Medicare HMO | Admitting: Physical Therapy

## 2019-03-02 DIAGNOSIS — R296 Repeated falls: Secondary | ICD-10-CM | POA: Diagnosis not present

## 2019-03-02 DIAGNOSIS — R2681 Unsteadiness on feet: Secondary | ICD-10-CM | POA: Diagnosis not present

## 2019-03-02 DIAGNOSIS — R262 Difficulty in walking, not elsewhere classified: Secondary | ICD-10-CM | POA: Diagnosis not present

## 2019-03-02 DIAGNOSIS — R3915 Urgency of urination: Secondary | ICD-10-CM | POA: Diagnosis not present

## 2019-03-02 DIAGNOSIS — N3941 Urge incontinence: Secondary | ICD-10-CM | POA: Diagnosis not present

## 2019-03-02 NOTE — Therapy (Signed)
[facility] [place], [place], [address] Phone: [phone]   Fax:  [phone]  Physical Therapy Treatment PHYSICAL THERAPY DISCHARGE SUMMARY  Visits from Start of Care: 11  Current functional level related to goals / functional outcomes: See below   Remaining deficits: See goals   Education / Equipment: HEP Plan: Patient agrees to discharge.  Patient goals were partially met. Patient is being discharged due to being pleased with the current functional level.  ?????  Gabriela Eves, PT, DPT 03/02/19   Patient Details  Name: [name] MRN: [id number] Date of Birth: October 24, 1935 Referring Provider (PT): Carolann Littler, MD   Encounter Date: 03/02/2019  PT End of Session - 03/02/19 1513    Visit Number  11    Number of Visits  12    Date for PT Re-Evaluation  03/07/19    Authorization Type  progress note every 10th visit, KX modifier at 15th visit    PT Start Time  0227    PT Stop Time  0313    PT Time Calculation (min)  46 min    Activity Tolerance  Patient tolerated treatment well    Behavior During Therapy  [facility] for tasks assessed/performed       Past Medical History:  Diagnosis Date  . CELLULITIS, RIGHT LEG 04/22/2009  . DM w/o complication type II 6/96/2952  . ECZEMA 05/08/2008  . HYPERLIPIDEMIA 05/08/2008  . HYPOTHYROIDISM 05/08/2008  . KNEE, ARTHRITIS, DEGEN./OSTEO 03/25/2009  . SUPERFICIAL PHLEBITIS 12/27/2008  . Unspecified essential hypertension 05/08/2008    Past Surgical History:  Procedure Laterality Date  . CATARACT EXTRACTION  2005   bilateral  . CHOLECYSTECTOMY    . COLONOSCOPY  04-15-2004   tics  . KNEE SURGERY     TKR Dr Alma Friendly 2007  . nasal polyps    . TONSILLECTOMY  1946  . WRIST SURGERY  2015   right side    There were no vitals filed for this visit.  Subjective Assessment - 03/02/19 1428    Subjective  COVID-19 screening performed upon arrival. No new complaints today and  patient using SPC upon arrival    Pertinent History  HTN    Limitations  Standing;Walking;House hold activities    Diagnostic tests  x-ray: normal    Patient Stated Goals  get back to normal with no falls    Currently in Pain?  No/denies         [facility] PT Assessment - 03/02/19 0001      Strength   Strength Assessment Site  Knee;Hip    Right/Left Hip  Right;Left    Right Hip Flexion  4+/5    Right Hip ABduction  5/5    Right Hip ADduction  4+/5    Left Hip ABduction  5/5    Left Hip ADduction  4+/5    Right Ankle Dorsiflexion  4+/5    Left Ankle Dorsiflexion  4+/5                   [facility] Adult PT Treatment/Exercise - 03/02/19 0001      Lumbar Exercises: Supine   Bridge  20 reps      Knee/Hip Exercises: Aerobic   Nustep  L3, seat 11 x32mn UE/LE activity, monitored for progression      Knee/Hip Exercises: Standing   Heel Raises  Both;20 reps    Knee Flexion  AROM;Both;2 sets;10 reps      Knee/Hip Exercises: Seated   Long Arc  Quad  Strengthening;Both;2 sets;10 reps;Weights    Long [employer]  4 lbs.    Ball Squeeze  x3 minutes5 sec hold    Clamshell with [employer]  Red   57mn   Marching  Strengthening;Both;2 sets;10 reps;Weights    Hamstring Curl  Strengthening;Both;2 sets;10 reps;Limitations    Hamstring Limitations  red theraband    Sit to Sand  5 reps;with UE support                  PT Long Term Goals - 03/02/19 1449      PT LONG TERM GOAL #1   Title  Patient will be independent with HEP    Time  6    Period  Weeks    Status  Achieved   issued advanced HEP 03/02/19     PT LONG TERM GOAL #2   Title  Patient will demonstrate 4+/5 bilateral LE MMT to improve stability during functional tasks.    Time  6    Period  Weeks    Status  Achieved      PT LONG TERM GOAL #3   Title  Patient will perform modified 5x sit to stand test in 18 seconds or less to improve balance and decrease risk of falls.    Time  6    Period  Weeks     Status  Not Met   x3 in 18 seconds 03/02/19     PT LONG TERM GOAL #4   Title  Patient will ambulate community distances with walker or LRD to safely access his community.    Time  6    Period  Weeks    Status  Achieved   patient reported using a cane and able to walk a community distance 03/02/19           Plan - 03/02/19 1506    Clinical Impression Statement  Patient requested today to be his last day. Patient has met all current goals except sit to stand. Patient has some weakness with core and unable to sit to stand at a fast pace yet able to safely. Today issued advanced HEP to help patient progress at home with balance and core /LE strengthening.    Personal Factors and Comorbidities  Age;Time since onset of injury/illness/exacerbation;Comorbidity 1    Comorbidities  HTN    Examination-Activity Limitations  Stand;Transfers;Locomotion Level    Stability/Clinical Decision Making  Stable/Uncomplicated    Rehab Potential  Good    PT Frequency  2x / week    PT Duration  6 weeks    PT Treatment/Interventions  ADLs/Self Care Home Management;Balance training;Therapeutic exercise;Therapeutic activities;Functional mobility training;Stair training;Gait training;Neuromuscular re-education;Patient/family education;Manual techniques;Passive range of motion    PT Next Visit Plan  DC    Consulted and Agree with Plan of Care  Patient       Patient will benefit from skilled therapeutic intervention in order to improve the following deficits and impairments:  Decreased activity tolerance, Decreased balance, Decreased strength, Difficulty walking, Pain, Postural dysfunction  Visit Diagnosis: Unsteadiness on feet  Difficulty in walking, not elsewhere classified  Repeated falls     Problem List Patient Active Problem List   Diagnosis Date Noted  . Frequent falls 01/24/2019  . Hyponatremia 01/10/2019  . Depression, recurrent ([place] 12/27/2018  . CKD (chronic kidney disease) stage 3, GFR  30-59 ml/min 03/04/2018  . Urinary urgency 01/13/2017  . Cognitive decline 11/27/2016  . Vitamin B 12 deficiency 04/12/2015  . Erectile dysfunction  07/07/2010  . CELLULITIS, RIGHT LEG 04/22/2009  . KNEE, ARTHRITIS, DEGEN./OSTEO 03/25/2009  . SUPERFICIAL PHLEBITIS 12/27/2008  . Hypothyroidism 05/08/2008  . Type 2 diabetes mellitus, controlled ([place]) 05/08/2008  . Hyperlipidemia 05/08/2008  . Essential hypertension 05/08/2008  . ECZEMA 05/08/2008    Ladean Raya, PTA 03/02/19 3:17 PM  [facility] [place], [place], [address] Phone: [phone]   Fax:  [phone]  Name: [name] MRN: [id number] Date of Birth: 12/24/1935

## 2019-03-02 NOTE — Patient Instructions (Signed)
   Toe-Up (Ankle Plantar Flexion and Dorsiflexion)   Holding a stable object, rise up on toes. Hold ____ seconds. Then rock back on heels and Hold 2____ seconds. Repeat _5-10___ times. Do _1-2___ sessions per day.  http://gt2.exer.us/409   Copyright  VHI. All rights reserved.  High Stepping   Using support, lift knees, taking high steps. Repeat __5-10__ times. Do __1-2__ sessions per day.     Bridging   Slowly raise buttocks from floor, keeping stomach tight. Repeat _10___ times per set. Do __2__ sets per session. Do __2__ sessions per day.       Half Squat to Chair   Stand with feet shoulder width apart. Push buttocks backward and lower slowly, sitting in chair lightly and returning to standing position. Complete _2_ sets of 10_ repetitions. Perform __2-3_ sessions per day.

## 2019-03-16 DIAGNOSIS — H6123 Impacted cerumen, bilateral: Secondary | ICD-10-CM | POA: Diagnosis not present

## 2019-03-29 DIAGNOSIS — R351 Nocturia: Secondary | ICD-10-CM | POA: Diagnosis not present

## 2019-03-29 DIAGNOSIS — N3941 Urge incontinence: Secondary | ICD-10-CM | POA: Diagnosis not present

## 2019-03-29 DIAGNOSIS — N311 Reflex neuropathic bladder, not elsewhere classified: Secondary | ICD-10-CM | POA: Diagnosis not present

## 2019-03-30 DIAGNOSIS — R3915 Urgency of urination: Secondary | ICD-10-CM | POA: Diagnosis not present

## 2019-03-30 DIAGNOSIS — N3941 Urge incontinence: Secondary | ICD-10-CM | POA: Diagnosis not present

## 2019-03-31 ENCOUNTER — Other Ambulatory Visit: Payer: Self-pay

## 2019-03-31 ENCOUNTER — Ambulatory Visit (INDEPENDENT_AMBULATORY_CARE_PROVIDER_SITE_OTHER): Payer: Medicare HMO | Admitting: Family Medicine

## 2019-03-31 ENCOUNTER — Encounter: Payer: Self-pay | Admitting: Family Medicine

## 2019-03-31 VITALS — BP 110/70 | Temp 97.2°F | Wt 178.5 lb

## 2019-03-31 DIAGNOSIS — R296 Repeated falls: Secondary | ICD-10-CM

## 2019-03-31 DIAGNOSIS — E119 Type 2 diabetes mellitus without complications: Secondary | ICD-10-CM

## 2019-03-31 DIAGNOSIS — E039 Hypothyroidism, unspecified: Secondary | ICD-10-CM | POA: Diagnosis not present

## 2019-03-31 DIAGNOSIS — I10 Essential (primary) hypertension: Secondary | ICD-10-CM | POA: Diagnosis not present

## 2019-03-31 DIAGNOSIS — F339 Major depressive disorder, recurrent, unspecified: Secondary | ICD-10-CM | POA: Diagnosis not present

## 2019-03-31 NOTE — Patient Instructions (Signed)
Major Depressive Disorder, Adult Major depressive disorder (MDD) is a mental health condition. It may also be called clinical depression or unipolar depression. MDD usually causes feelings of sadness, hopelessness, or helplessness. MDD can also cause physical symptoms. It can interfere with work, school, relationships, and other everyday activities. MDD may be mild, moderate, or severe. It may occur once (single episode major depressive disorder) or it may occur multiple times (recurrent major depressive disorder). What are the causes? The exact cause of this condition is not known. MDD is most likely caused by a combination of things, which may include:  Genetic factors. These are traits that are passed along from parent to child.  Individual factors. Your personality, your behavior, and the way you handle your thoughts and feelings may contribute to MDD. This includes personality traits and behaviors learned from others.  Physical factors, such as: ? Differences in the part of your brain that controls emotion. This part of your brain may be different than it is in people who do not have MDD. ? Long-term (chronic) medical or psychiatric illnesses.  Social factors. Traumatic experiences or major life changes may play a role in the development of MDD. What increases the risk? This condition is more likely to develop in women. The following factors may also make you more likely to develop MDD:  A family history of depression.  Troubled family relationships.  Abnormally low levels of certain brain chemicals.  Traumatic events in childhood, especially abuse or the loss of a parent.  Being under a lot of stress, or long-term stress, especially from upsetting life experiences or losses.  A history of: ? Chronic physical illness. ? Other mental health disorders. ? Substance abuse.  Poor living conditions.  Experiencing social exclusion or discrimination on a regular basis. What are the  signs or symptoms? The main symptoms of MDD typically include:  Constant depressed or irritable mood.  Loss of interest in things and activities. MDD symptoms may also include:  Sleeping or eating too much or too little.  Unexplained weight change.  Fatigue or low energy.  Feelings of worthlessness or guilt.  Difficulty thinking clearly or making decisions.  Thoughts of suicide or of harming others.  Physical agitation or weakness.  Isolation. Severe cases of MDD may also occur with other symptoms, such as:  Delusions or hallucinations, in which you imagine things that are not real (psychotic depression).  Low-level depression that lasts at least a year (chronic depression or persistent depressive disorder).  Extreme sadness and hopelessness (melancholic depression).  Trouble speaking and moving (catatonic depression). How is this diagnosed? This condition may be diagnosed based on:  Your symptoms.  Your medical history, including your mental health history. This may involve tests to evaluate your mental health. You may be asked questions about your lifestyle, including any drug and alcohol use, and how long you have had symptoms of MDD.  A physical exam.  Blood tests to rule out other conditions. You must have a depressed mood and at least four other MDD symptoms most of the day, nearly every day in the same 2-week timeframe before your health care provider can confirm a diagnosis of MDD. How is this treated? This condition is usually treated by mental health professionals, such as psychologists, psychiatrists, and clinical social workers. You may need more than one type of treatment. Treatment may include:  Psychotherapy. This is also called talk therapy or counseling. Types of psychotherapy include: ? Cognitive behavioral therapy (CBT). This type of therapy   teaches you to recognize unhealthy feelings, thoughts, and behaviors, and replace them with positive thoughts  and actions. ? Interpersonal therapy (IPT). This helps you to improve the way you relate to and communicate with others. ? Family therapy. This treatment includes members of your family.  Medicine to treat anxiety and depression, or to help you control certain emotions and behaviors.  Lifestyle changes, such as: ? Limiting alcohol and drug use. ? Exercising regularly. ? Getting plenty of sleep. ? Making healthy eating choices. ? Spending more time outdoors.  Treatments involving stimulation of the brain can be used in situations with extremely severe symptoms, or when medicine or other therapies do not work over time. These treatments include electroconvulsive therapy, transcranial magnetic stimulation, and vagal nerve stimulation. Follow these instructions at home: Activity  Return to your normal activities as told by your health care provider.  Exercise regularly and spend time outdoors as told by your health care provider. General instructions  Take over-the-counter and prescription medicines only as told by your health care provider.  Do not drink alcohol. If you drink alcohol, limit your alcohol intake to no more than 1 drink a day for nonpregnant women and 2 drinks a day for men. One drink equals 12 oz of beer, 5 oz of wine, or 1 oz of hard liquor. Alcohol can affect any antidepressant medicines you are taking. Talk to your health care provider about your alcohol use.  Eat a healthy diet and get plenty of sleep.  Find activities that you enjoy doing, and make time to do them.  Consider joining a support group. Your health care provider may be able to recommend a support group.  Keep all follow-up visits as told by your health care provider. This is important. Where to find more information National Alliance on Mental Illness  www.nami.org [place]. National Institute of Mental Health  www.nimh.nih.gov National Suicide Prevention Lifeline  1-800-273-TALK (8255). This is  free, 24-hour help. Contact a health care provider if:  Your symptoms get worse.  You develop new symptoms. Get help right away if:  You self-harm.  You have serious thoughts about hurting yourself or others.  You see, hear, taste, smell, or feel things that are not present (hallucinate). This information is not intended to replace advice given to you by your health care provider. Make sure you discuss any questions you have with your health care provider. Document Revised: 01/22/2017 Document Reviewed: 08/21/2015 Elsevier Patient Education  2020 Elsevier Inc.  

## 2019-03-31 NOTE — Progress Notes (Signed)
Subjective:     Patient ID: [name], male   DOB: 1935-11-07, 84 y.o.   MRN: [id number]  HPI   [name] seen for follow-up depression.  Refer to previous note.  He had had some recent weight loss and loss of interest in activities, depressed mood, increased agitation at times.  Started Lexapro 10 mg daily and both he and his wife is seeing great improvements.  She feels he is getting back to baseline.  He is not any suicidal ideation.  No adverse side effects.  He has gained another couple pounds since last visit.  Multiple other chronic problems including hypertension, hypothyroidism, type 2 diabetes, chronic kidney disease stage III, hyperlipidemia, and history of B12 deficiency.  His medications were reviewed and compliant with all.  He had had some recent falls and has been getting physical therapy and they think that has helped tremendously.  Past Medical History:  Diagnosis Date  . CELLULITIS, RIGHT LEG 04/22/2009  . DM w/o complication type II [id number]  . ECZEMA 05/08/2008  . HYPERLIPIDEMIA 05/08/2008  . HYPOTHYROIDISM 05/08/2008  . KNEE, ARTHRITIS, DEGEN./OSTEO 03/25/2009  . SUPERFICIAL PHLEBITIS 12/27/2008  . Unspecified essential hypertension 05/08/2008   Past Surgical History:  Procedure Laterality Date  . CATARACT EXTRACTION  2005   bilateral  . CHOLECYSTECTOMY    . COLONOSCOPY  04-15-2004   tics  . KNEE SURGERY     TKR Dr Alma Friendly 2007  . nasal polyps    . TONSILLECTOMY  1946  . WRIST SURGERY  2015   right side    reports that he has never smoked. He has never used smokeless tobacco. He reports that he does not drink alcohol or use drugs. family history includes Arthritis in an other family member; Heart disease in an other family member; Hypertension in an other family member. No Known Allergies   Review of Systems  Constitutional: Negative for fatigue.  Eyes: Negative for visual disturbance.  Respiratory: Negative for cough, chest tightness and shortness of  breath.   Cardiovascular: Negative for chest pain, palpitations and leg swelling.  Neurological: Negative for dizziness, syncope, weakness, light-headedness and headaches.       Objective:   Physical Exam Constitutional:      Appearance: He is well-developed.  HENT:     Right Ear: External ear normal.     Left Ear: External ear normal.  Eyes:     Pupils: Pupils are equal, round, and reactive to light.  Neck:     Thyroid: No thyromegaly.  Cardiovascular:     Rate and Rhythm: Normal rate and regular rhythm.  Pulmonary:     Effort: Pulmonary effort is normal. No respiratory distress.     Breath sounds: Normal breath sounds. No wheezing or rales.  Musculoskeletal:     Cervical back: Neck supple.  Neurological:     Mental Status: He is alert and oriented to person, place, and time.  Psychiatric:     Comments: PHQ-9 equals 4        Assessment:     #1 major depression greatly improved with Lexapro.  #2 hypertension stable and at goal  #3 type 2 diabetes with history of fair control  #4 hypothyroidism stable by recent labs    Plan:     -We recommend a minimum of 6 to 9 months of Lexapro therapy and possibly longer  -Bring back in 3 months to reassess.  We will plan to get some follow-up labs then including A1c  -We  strongly encouraged him to try to get back to some hobbies and engage in activities each day  [name] MD [facility]

## 2019-04-18 DIAGNOSIS — L84 Corns and callosities: Secondary | ICD-10-CM | POA: Diagnosis not present

## 2019-04-18 DIAGNOSIS — M79676 Pain in unspecified toe(s): Secondary | ICD-10-CM | POA: Diagnosis not present

## 2019-04-18 DIAGNOSIS — B351 Tinea unguium: Secondary | ICD-10-CM | POA: Diagnosis not present

## 2019-04-18 DIAGNOSIS — E1151 Type 2 diabetes mellitus with diabetic peripheral angiopathy without gangrene: Secondary | ICD-10-CM | POA: Diagnosis not present

## 2019-04-20 DIAGNOSIS — N3941 Urge incontinence: Secondary | ICD-10-CM | POA: Diagnosis not present

## 2019-05-09 DIAGNOSIS — N3941 Urge incontinence: Secondary | ICD-10-CM | POA: Diagnosis not present

## 2019-05-09 DIAGNOSIS — R350 Frequency of micturition: Secondary | ICD-10-CM | POA: Diagnosis not present

## 2019-05-12 DIAGNOSIS — N3941 Urge incontinence: Secondary | ICD-10-CM | POA: Diagnosis not present

## 2019-06-01 DIAGNOSIS — R3915 Urgency of urination: Secondary | ICD-10-CM | POA: Diagnosis not present

## 2019-06-01 DIAGNOSIS — N3941 Urge incontinence: Secondary | ICD-10-CM | POA: Diagnosis not present

## 2019-06-01 DIAGNOSIS — R350 Frequency of micturition: Secondary | ICD-10-CM | POA: Diagnosis not present

## 2019-06-02 DIAGNOSIS — Z0100 Encounter for examination of eyes and vision without abnormal findings: Secondary | ICD-10-CM | POA: Diagnosis not present

## 2019-06-06 ENCOUNTER — Encounter: Payer: Self-pay | Admitting: Family Medicine

## 2019-06-06 DIAGNOSIS — R2689 Other abnormalities of gait and mobility: Secondary | ICD-10-CM

## 2019-06-08 ENCOUNTER — Ambulatory Visit: Payer: Medicare HMO | Attending: Family Medicine | Admitting: Physical Therapy

## 2019-06-08 ENCOUNTER — Other Ambulatory Visit: Payer: Self-pay

## 2019-06-08 ENCOUNTER — Encounter: Payer: Self-pay | Admitting: Physical Therapy

## 2019-06-08 DIAGNOSIS — R262 Difficulty in walking, not elsewhere classified: Secondary | ICD-10-CM | POA: Insufficient documentation

## 2019-06-08 DIAGNOSIS — R2681 Unsteadiness on feet: Secondary | ICD-10-CM | POA: Diagnosis not present

## 2019-06-08 DIAGNOSIS — R296 Repeated falls: Secondary | ICD-10-CM | POA: Diagnosis not present

## 2019-06-08 NOTE — Therapy (Signed)
[facility] [place], [place], [address] Phone: [phone]   Fax:  [phone]  Physical Therapy Evaluation  Patient Details  Name: [name] MRN: [id number] Date of Birth: September 20, 1935 Referring Provider (PT): Carolann Littler, MD   Encounter Date: 06/08/2019  PT End of Session - 06/08/19 1150    Visit Number  1    Number of Visits  12    Date for PT Re-Evaluation  07/27/19    Authorization Type  [employer]; Progress note every 10th visit; KX modifier at 15th visit    PT Start Time  0900    PT Stop Time  0946    PT Time Calculation (min)  46 min    Equipment Utilized During Treatment  Gait belt    Activity Tolerance  Patient tolerated treatment well    Behavior During Therapy  [facility] for tasks assessed/performed       Past Medical History:  Diagnosis Date  . CELLULITIS, RIGHT LEG 04/22/2009  . DM w/o complication type II [id number]  . ECZEMA 05/08/2008  . HYPERLIPIDEMIA 05/08/2008  . HYPOTHYROIDISM 05/08/2008  . KNEE, ARTHRITIS, DEGEN./OSTEO 03/25/2009  . SUPERFICIAL PHLEBITIS 12/27/2008  . Unspecified essential hypertension 05/08/2008    Past Surgical History:  Procedure Laterality Date  . CATARACT EXTRACTION  2005   bilateral  . CHOLECYSTECTOMY    . COLONOSCOPY  04-15-2004   tics  . KNEE SURGERY     TKR Dr Alma Friendly 2007  . nasal polyps    . TONSILLECTOMY  1946  . WRIST SURGERY  2015   right side    There were no vitals filed for this visit.   Subjective Assessment - 06/08/19 1146    Subjective  COVID-19 screening performed upon arrival.Patient arrives to physical therapy with difficulty walking and lack of confidence with balance that has been ongoing. Patient reports being compliant with exercises provided from last episode of care. Patient reports some near falls has made him more cautious with ambulation and transfers from sit to stand and transferring in/out of bed. Patient has not had any falls since  last episode of care. Patient denies pain. Patient's goals are to improve movement, improve strength, improve balance, and gain more confidence with walking.    Pertinent History  HTN, B TKA    Limitations  Walking;House hold activities    Patient Stated Goals  improve balance, improve confidence and improve movement    Currently in Pain?  No/denies         [facility] PT Assessment - 06/08/19 0001      Assessment   Medical Diagnosis  balance problems    Referring Provider (PT)  Carolann Littler, MD    Onset Date/Surgical Date  --   ongoing   Next MD Visit  May 2021    Prior Therapy  yes      Precautions   Precautions  Fall      Restrictions   Weight Bearing Restrictions  No      Balance Screen   Has the patient fallen in the past 6 months  No    Has the patient had a decrease in activity level because of a fear of falling?   Yes    Is the patient reluctant to leave their home because of a fear of falling?   No      Home Film/video editor residence    Living Arrangements  Spouse/significant other  Prior Function   Level of Independence  Independent    Leisure  walking for exercise      ROM / Strength   AROM / PROM / Strength  Strength      Strength   Strength Assessment Site  Hip;Knee;Ankle    Right/Left Hip  Right;Left    Right Hip Flexion  3/5    Left Hip Flexion  3-/5    Right/Left Knee  Right;Left    Right Knee Flexion  4-/5    Right Knee Extension  4/5    Left Knee Flexion  4-/5    Left Knee Extension  4/5    Right/Left Ankle  Right;Left    Right Ankle Dorsiflexion  4/5    Right Ankle Inversion  3+/5    Right Ankle Eversion  3-/5    Left Ankle Dorsiflexion  4/5    Left Ankle Inversion  3+/5    Left Ankle Eversion  3-/5      Ambulation/Gait   Gait Pattern  Step-through pattern;Decreased stride length;Decreased step length - left;Decreased step length - right;Decreased dorsiflexion - right;Decreased dorsiflexion - left;Trunk  flexed;Wide base of support;Poor foot clearance - left;Poor foot clearance - right    Gait Comments  cautious gait and slow gait speed      Balance   Balance Assessed  Yes      Standardized Balance Assessment   Standardized Balance Assessment  Berg Balance Test;Five Times Sit to Stand    Five times sit to stand comments   Modified with UE support 23.6 seconds      Berg Balance Test   Sit to Stand  Able to stand  independently using hands    Standing Unsupported  Able to stand 2 minutes with supervision    Sitting with Back Unsupported but Feet Supported on Floor or Stool  Able to sit safely and securely 2 minutes    Stand to Sit  Uses backs of legs against chair to control descent    Transfers  Able to transfer safely, definite need of hands    Standing Unsupported with Eyes Closed  Able to stand 10 seconds with supervision    Standing Unsupported with Feet Together  Able to place feet together independently and stand for 1 minute with supervision    From Standing, Reach Forward with Outstretched Arm  Can reach confidently >25 cm (10")    From Standing Position, Pick up Object from Floor  Able to pick up shoe, needs supervision    From Standing Position, Turn to Look Behind Over each Shoulder  Turn sideways only but maintains balance    Turn 360 Degrees  Able to turn 360 degrees safely but slowly    Standing Unsupported, Alternately Place Feet on Step/Stool  Able to complete >2 steps/needs minimal assist    Standing Unsupported, One Foot in Front  Needs help to step but can hold 15 seconds    Standing on One Leg  Unable to try or needs assist to prevent fall    Total Score  34                Objective measurements completed on examination: See above findings.              PT Education - 06/08/19 1149    Education Details  hip abduction, hamstring curls, seated hip abduction and pillow squeeze    Person(s) Educated  Patient    Methods   Explanation;Handout;Demonstration    Comprehension  Verbalized understanding;Returned demonstration          PT Long Term Goals - 06/08/19 1150      PT LONG TERM GOAL #1   Title  Patient will be independent with HEP    Period  Weeks    Status  New      PT LONG TERM GOAL #2   Title  Patient will demonstrate 4+/5 bilateral LE MMT to improve stability during functional tasks.    Time  6    Period  Weeks    Status  New      PT LONG TERM GOAL #3   Title  Patient will perform modified 5x sit to stand test in 18 seconds or less to improve balance and decrease risk of falls.    Time  6    Period  Weeks    Status  New      PT LONG TERM GOAL #4   Title  Patient will improve Berg Balance Scale to 41/56 or greater to decrease risk of falls.    Time  6    Period  Weeks    Status  New             Plan - 06/08/19 1211    Clinical Impression Statement  Patient is an 84 year old male who presents to physical therapy with decreased bilateral LE MMT and decreased balance. Patient's modified 5x sit to stand with UE support of 22.6 seconds categorizes him as a fall risk with decreased LE functional strength. Patient's Berg Balance Scale score of 34/56 categorizes him as a moderate fall risk. Patient ambulates with cautious gait pattern with decreased step length bilaterally and with wide base of support. Patient and PT discussed HEP and plan of care to address deficits and goals. Patient would benefit form skilled physical therapy to address deficits and goals.    Personal Factors and Comorbidities  Comorbidity 1    Comorbidities  HTN    Examination-Activity Limitations  Stairs;Stand;Transfers;Locomotion Level    Stability/Clinical Decision Making  Stable/Uncomplicated    Clinical Decision Making  Low    Rehab Potential  Good    PT Frequency  2x / week    PT Duration  6 weeks    PT Treatment/Interventions  ADLs/Self Care Home Management;Moist Heat;Cryotherapy;Gait training;Stair  training;Functional mobility training;Therapeutic activities;Therapeutic exercise;Balance training;Neuromuscular re-education;Manual techniques;Patient/family education;Passive range of motion    PT Next Visit Plan  nustep, LE strengthening and core stabilization, balance in sitting and standing.    PT Home Exercise Plan  see patient education section    Consulted and Agree with Plan of Care  Patient       Patient will benefit from skilled therapeutic intervention in order to improve the following deficits and impairments:  Difficulty walking, Decreased activity tolerance, Decreased balance, Decreased strength, Abnormal gait, Postural dysfunction  Visit Diagnosis: Unsteadiness on feet - Plan: PT plan of care cert/re-cert  Difficulty in walking, not elsewhere classified - Plan: PT plan of care cert/re-cert  Repeated falls - Plan: PT plan of care cert/re-cert     Problem List Patient Active Problem List   Diagnosis Date Noted  . Frequent falls 01/24/2019  . Hyponatremia 01/10/2019  . Depression, recurrent ([place]) 12/27/2018  . CKD (chronic kidney disease) stage 3, GFR 30-59 ml/min 03/04/2018  . Urinary urgency 01/13/2017  . Cognitive decline 11/27/2016  . Vitamin B 12 deficiency 04/12/2015  . Erectile dysfunction 07/07/2010  . CELLULITIS, RIGHT LEG 04/22/2009  . KNEE, ARTHRITIS, DEGEN./OSTEO  03/25/2009  . SUPERFICIAL PHLEBITIS 12/27/2008  . Hypothyroidism 05/08/2008  . Type 2 diabetes mellitus, controlled ([place]) 05/08/2008  . Hyperlipidemia 05/08/2008  . Essential hypertension 05/08/2008  . ECZEMA 05/08/2008    Gabriela Eves, PT, DPT 06/08/2019, 12:16 PM  [facility] [address] [place], [place], [address] Phone: [phone]   Fax:  [phone]  Name: [name] MRN: [id number] Date of Birth: [birth year]

## 2019-06-13 DIAGNOSIS — H26493 Other secondary cataract, bilateral: Secondary | ICD-10-CM | POA: Diagnosis not present

## 2019-06-15 ENCOUNTER — Encounter: Payer: Self-pay | Admitting: Physical Therapy

## 2019-06-15 ENCOUNTER — Other Ambulatory Visit: Payer: Self-pay

## 2019-06-15 ENCOUNTER — Ambulatory Visit: Payer: Medicare HMO | Admitting: Physical Therapy

## 2019-06-15 DIAGNOSIS — R2681 Unsteadiness on feet: Secondary | ICD-10-CM

## 2019-06-15 DIAGNOSIS — R296 Repeated falls: Secondary | ICD-10-CM

## 2019-06-15 DIAGNOSIS — R262 Difficulty in walking, not elsewhere classified: Secondary | ICD-10-CM

## 2019-06-15 NOTE — Therapy (Signed)
[facility] [place], [place], [address] Phone: [phone]   Fax:  [phone]  Physical Therapy Treatment  Patient Details  Name: [name] MRN: [id number] Date of Birth: 1935-03-16 Referring Provider (PT): Carolann Littler, MD   Encounter Date: 06/15/2019  PT End of Session - 06/15/19 1116    Visit Number  2    Number of Visits  12    Date for PT Re-Evaluation  07/27/19    Authorization Type  [employer]; Progress note every 10th visit; KX modifier at 15th visit    PT Start Time  1033    PT Stop Time  1116    PT Time Calculation (min)  43 min    Activity Tolerance  Patient tolerated treatment well    Behavior During Therapy  [facility] for tasks assessed/performed       Past Medical History:  Diagnosis Date  . CELLULITIS, RIGHT LEG 04/22/2009  . DM w/o complication type II [id number]  . ECZEMA 05/08/2008  . HYPERLIPIDEMIA 05/08/2008  . HYPOTHYROIDISM 05/08/2008  . KNEE, ARTHRITIS, DEGEN./OSTEO 03/25/2009  . SUPERFICIAL PHLEBITIS 12/27/2008  . Unspecified essential hypertension 05/08/2008    Past Surgical History:  Procedure Laterality Date  . CATARACT EXTRACTION  2005   bilateral  . CHOLECYSTECTOMY    . COLONOSCOPY  04-15-2004   tics  . KNEE SURGERY     TKR Dr Alma Friendly 2007  . nasal polyps    . TONSILLECTOMY  1946  . WRIST SURGERY  2015   right side    There were no vitals filed for this visit.  Subjective Assessment - 06/15/19 1039    Subjective  COVID-19 screening performed upon arrival. Patient arrived and reported he wants to get stronger and not fall anymore    Pertinent History  HTN, B TKA    Limitations  Walking;House hold activities    Patient Stated Goals  improve balance, improve confidence and improve movement    Currently in Pain?  No/denies                       [facility] Adult PT Treatment/Exercise - 06/15/19 0001      Exercises   Exercises  Lumbar;Knee/Hip      Lumbar  Exercises: Aerobic   Nustep  L4 x21min UE/LE, monitored      Lumbar Exercises: Supine   Clam  20 reps    Clam Limitations  red t-band    Straight Leg Raise  2 seconds   2x20     Knee/Hip Exercises: Seated   Long Arc Quad  Strengthening;Both;20 reps;Weights    Long Arc Quad Weight  3 lbs.    Hamstring Curl  Strengthening;Both;20 reps    Hamstring Limitations  red t-band          Balance Exercises - 06/15/19 1045      Balance Exercises: Standing   Standing Eyes Opened  Wide (BOA);Foam/compliant surface   x29min   Standing, One Foot on a Step  6 inch;Eyes open;4 reps;10 secs    Rockerboard  Anterior/posterior;Intermittent UE support   x65min   Marching  Solid surface;Intermittent upper extremity assist;20 reps    Heel Raises  Both;15 reps    Toe Raise  Both;15 reps    Sit to Stand  Elevated surface   x10            PT Long Term Goals - 06/15/19 1129      PT LONG TERM GOAL #  1   Title  Patient will be independent with HEP    Time  6    Period  Weeks    Status  On-going      PT LONG TERM GOAL #2   Title  Patient will demonstrate 4+/5 bilateral LE MMT to improve stability during functional tasks.    Time  6    Period  Weeks    Status  On-going      PT LONG TERM GOAL #3   Title  Patient will perform modified 5x sit to stand test in 18 seconds or less to improve balance and decrease risk of falls.    Time  6    Period  Weeks    Status  On-going      PT LONG TERM GOAL #4   Title  Patient will improve Berg Balance Scale to 41/56 or greater to decrease risk of falls.    Time  6    Period  Weeks    Status  On-going            Plan - 06/15/19 1117    Clinical Impression Statement  Patient tolerated treatment well today. Patient able to progress with balance activities and bil LE exercises. Patient required SBA/CGA throghout for safety. Patient had a few LOB during balance exercises yet able to recover with parallel bars. Patient current goals ongoing at  this time. Educated patient on cane use to avoid falls.    Personal Factors and Comorbidities  Comorbidity 1    Comorbidities  HTN    Examination-Activity Limitations  Stairs;Stand;Transfers;Locomotion Level    Stability/Clinical Decision Making  Stable/Uncomplicated    Rehab Potential  Good    PT Frequency  2x / week    PT Duration  6 weeks    PT Treatment/Interventions  ADLs/Self Care Home Management;Moist Heat;Cryotherapy;Gait training;Stair training;Functional mobility training;Therapeutic activities;Therapeutic exercise;Balance training;Neuromuscular re-education;Manual techniques;Patient/family education;Passive range of motion    PT Next Visit Plan  cont with LE strengthening and core stabilization, balance in sitting and standing.    Consulted and Agree with Plan of Care  Patient       Patient will benefit from skilled therapeutic intervention in order to improve the following deficits and impairments:  Difficulty walking, Decreased activity tolerance, Decreased balance, Decreased strength, Abnormal gait, Postural dysfunction  Visit Diagnosis: Unsteadiness on feet  Difficulty in walking, not elsewhere classified  Repeated falls     Problem List Patient Active Problem List   Diagnosis Date Noted  . Frequent falls 01/24/2019  . Hyponatremia 01/10/2019  . Depression, recurrent ([place]) 12/27/2018  . CKD (chronic kidney disease) stage 3, GFR 30-59 ml/min 03/04/2018  . Urinary urgency 01/13/2017  . Cognitive decline 11/27/2016  . Vitamin B 12 deficiency 04/12/2015  . Erectile dysfunction 07/07/2010  . CELLULITIS, RIGHT LEG 04/22/2009  . KNEE, ARTHRITIS, DEGEN./OSTEO 03/25/2009  . SUPERFICIAL PHLEBITIS 12/27/2008  . Hypothyroidism 05/08/2008  . Type 2 diabetes mellitus, controlled ([place]) 05/08/2008  . Hyperlipidemia 05/08/2008  . Essential hypertension 05/08/2008  . ECZEMA 05/08/2008    Tonnie Friedel P, PTA 06/15/2019, 11:29 AM  [facility] [place], [place], [address] Phone: [phone]   Fax:  [phone]  Name: KHYRON HOLLENBERG MRN: [id number] Date of Birth: 07-28-1935

## 2019-06-20 ENCOUNTER — Encounter: Payer: Self-pay | Admitting: *Deleted

## 2019-06-20 ENCOUNTER — Other Ambulatory Visit: Payer: Self-pay

## 2019-06-20 ENCOUNTER — Ambulatory Visit: Payer: Medicare HMO | Admitting: *Deleted

## 2019-06-20 DIAGNOSIS — R2681 Unsteadiness on feet: Secondary | ICD-10-CM

## 2019-06-20 DIAGNOSIS — R262 Difficulty in walking, not elsewhere classified: Secondary | ICD-10-CM | POA: Diagnosis not present

## 2019-06-20 DIAGNOSIS — R296 Repeated falls: Secondary | ICD-10-CM

## 2019-06-20 NOTE — Therapy (Signed)
[facility] [place], [place], [address] Phone: [phone]   Fax:  [phone]  Physical Therapy Treatment  Patient Details  Name: [name] MRN: [id number] Date of Birth: 03/27/1935 Referring Provider (PT): Carolann Littler, MD   Encounter Date: 06/20/2019  PT End of Session - 06/20/19 1354    Visit Number  3    Number of Visits  12    Date for PT Re-Evaluation  07/27/19    Authorization Type  [employer]; Progress note every 10th visit; KX modifier at 15th visit    PT Start Time  1345    PT Stop Time  1433    PT Time Calculation (min)  48 min       Past Medical History:  Diagnosis Date  . CELLULITIS, RIGHT LEG 04/22/2009  . DM w/o complication type II [id number]  . ECZEMA 05/08/2008  . HYPERLIPIDEMIA 05/08/2008  . HYPOTHYROIDISM 05/08/2008  . KNEE, ARTHRITIS, DEGEN./OSTEO 03/25/2009  . SUPERFICIAL PHLEBITIS 12/27/2008  . Unspecified essential hypertension 05/08/2008    Past Surgical History:  Procedure Laterality Date  . CATARACT EXTRACTION  2005   bilateral  . CHOLECYSTECTOMY    . COLONOSCOPY  04-15-2004   tics  . KNEE SURGERY     TKR Dr Alma Friendly 2007  . nasal polyps    . TONSILLECTOMY  1946  . WRIST SURGERY  2015   right side    There were no vitals filed for this visit.  Subjective Assessment - 06/20/19 1353    Subjective  COVID-19 screening performed upon arrival. Patient arrived and reports doing good after last Rx    Pertinent History  HTN, B TKA    Limitations  Walking;House hold activities    Patient Stated Goals  improve balance, improve confidence and improve movement    Currently in Pain?  No/denies                       [facility] Adult PT Treatment/Exercise - 06/20/19 0001      Exercises   Exercises  Lumbar;Knee/Hip      Lumbar Exercises: Aerobic   Nustep  L4 x54min UE/LE, monitored      Knee/Hip Exercises: Seated   Long Arc Quad  Strengthening;Both;20 reps;Weights     Long Arc Quad Weight  3 lbs.    Sit to Sand  2 sets;10 reps;with UE support;without UE support          Balance Exercises - 06/20/19 1356      Balance Exercises: Standing   Standing Eyes Opened  Wide (BOA);Foam/compliant surface   x80min   Standing, One Foot on a Step  6 inch;Eyes open;4 reps;10 secs    Rockerboard  Anterior/posterior;Intermittent UE support   x40min   Marching  Solid surface;Intermittent upper extremity assist;20 reps    Heel Raises  Both;15 reps    Toe Raise  Both;15 reps    Sit to Stand  Elevated surface   x10            PT Long Term Goals - 06/15/19 1129      PT LONG TERM GOAL #1   Title  Patient will be independent with HEP    Time  6    Period  Weeks    Status  On-going      PT LONG TERM GOAL #2   Title  Patient will demonstrate 4+/5 bilateral LE MMT to improve stability during functional tasks.    Time  6    Period  Weeks    Status  On-going      PT LONG TERM GOAL #3   Title  Patient will perform modified 5x sit to stand test in 18 seconds or less to improve balance and decrease risk of falls.    Time  6    Period  Weeks    Status  On-going      PT LONG TERM GOAL #4   Title  Patient will improve Berg Balance Scale to 41/56 or greater to decrease risk of falls.    Time  6    Period  Weeks    Status  On-going            Plan - 06/20/19 1647    Clinical Impression Statement  Pt arrived today doing fair.Pt was guided through LE strengthening exs as well as balance act.'s. Pt required SBA and CGA during balance act.'s. , but able to hold longer times during Rx.    Personal Factors and Comorbidities  Comorbidity 1    Comorbidities  HTN    Examination-Activity Limitations  Stairs;Stand;Transfers;Locomotion Level    Stability/Clinical Decision Making  Stable/Uncomplicated    PT Duration  6 weeks    PT Treatment/Interventions  ADLs/Self Care Home Management;Moist Heat;Cryotherapy;Gait training;Stair training;Functional mobility  training;Therapeutic activities;Therapeutic exercise;Balance training;Neuromuscular re-education;Manual techniques;Patient/family education;Passive range of motion    PT Next Visit Plan  cont with LE strengthening and core stabilization, balance in sitting and standing.    PT Home Exercise Plan  see patient education section       Patient will benefit from skilled therapeutic intervention in order to improve the following deficits and impairments:  Difficulty walking, Decreased activity tolerance, Decreased balance, Decreased strength, Abnormal gait, Postural dysfunction  Visit Diagnosis: Unsteadiness on feet  Difficulty in walking, not elsewhere classified  Repeated falls     Problem List Patient Active Problem List   Diagnosis Date Noted  . Frequent falls 01/24/2019  . Hyponatremia 01/10/2019  . Depression, recurrent ([place]) 12/27/2018  . CKD (chronic kidney disease) stage 3, GFR 30-59 ml/min 03/04/2018  . Urinary urgency 01/13/2017  . Cognitive decline 11/27/2016  . Vitamin B 12 deficiency 04/12/2015  . Erectile dysfunction 07/07/2010  . CELLULITIS, RIGHT LEG 04/22/2009  . KNEE, ARTHRITIS, DEGEN./OSTEO 03/25/2009  . SUPERFICIAL PHLEBITIS 12/27/2008  . Hypothyroidism 05/08/2008  . Type 2 diabetes mellitus, controlled ([place]) 05/08/2008  . Hyperlipidemia 05/08/2008  . Essential hypertension 05/08/2008  . ECZEMA 05/08/2008    Kyson Kupper,CHRIS, PTA 06/20/2019, 5:05 PM  [facility] [place], [place], [address] Phone: [phone]   Fax:  [phone]  Name: [name] MRN: [id number] Date of Birth: [birth year]

## 2019-06-22 ENCOUNTER — Encounter: Payer: Self-pay | Admitting: Physical Therapy

## 2019-06-22 ENCOUNTER — Other Ambulatory Visit: Payer: Self-pay

## 2019-06-22 ENCOUNTER — Ambulatory Visit: Payer: Medicare HMO | Admitting: Physical Therapy

## 2019-06-22 DIAGNOSIS — R350 Frequency of micturition: Secondary | ICD-10-CM | POA: Diagnosis not present

## 2019-06-22 DIAGNOSIS — R296 Repeated falls: Secondary | ICD-10-CM

## 2019-06-22 DIAGNOSIS — R262 Difficulty in walking, not elsewhere classified: Secondary | ICD-10-CM

## 2019-06-22 DIAGNOSIS — R2681 Unsteadiness on feet: Secondary | ICD-10-CM | POA: Diagnosis not present

## 2019-06-22 DIAGNOSIS — N3941 Urge incontinence: Secondary | ICD-10-CM | POA: Diagnosis not present

## 2019-06-22 DIAGNOSIS — R3915 Urgency of urination: Secondary | ICD-10-CM | POA: Diagnosis not present

## 2019-06-22 NOTE — Therapy (Signed)
[facility] [place], [place], [address] Phone: [phone]   Fax:  [phone]  Physical Therapy Treatment  Patient Details  Name: [name] MRN: [id number] Date of Birth: 12-01-1935 Referring Provider (PT): Carolann Littler, MD   Encounter Date: 06/22/2019  PT End of Session - 06/22/19 0953    Visit Number  4    Number of Visits  12    Date for PT Re-Evaluation  07/27/19    Authorization Type  [employer]; Progress note every 10th visit; KX modifier at 15th visit    PT Start Time  0944    PT Stop Time  1028    PT Time Calculation (min)  44 min    Activity Tolerance  Patient tolerated treatment well    Behavior During Therapy  [facility] for tasks assessed/performed       Past Medical History:  Diagnosis Date  . CELLULITIS, RIGHT LEG 04/22/2009  . DM w/o complication type II [id number]  . ECZEMA 05/08/2008  . HYPERLIPIDEMIA 05/08/2008  . HYPOTHYROIDISM 05/08/2008  . KNEE, ARTHRITIS, DEGEN./OSTEO 03/25/2009  . SUPERFICIAL PHLEBITIS 12/27/2008  . Unspecified essential hypertension 05/08/2008    Past Surgical History:  Procedure Laterality Date  . CATARACT EXTRACTION  2005   bilateral  . CHOLECYSTECTOMY    . COLONOSCOPY  04-15-2004   tics  . KNEE SURGERY     TKR Dr Alma Friendly 2007  . nasal polyps    . TONSILLECTOMY  1946  . WRIST SURGERY  2015   right side    There were no vitals filed for this visit.  Subjective Assessment - 06/22/19 0952    Subjective  COVID-19 screening performed upon arrival. Patient arrived and no new complaints today"doing well"    Pertinent History  HTN, B TKA    Limitations  Walking;House hold activities    Patient Stated Goals  improve balance, improve confidence and improve movement    Currently in Pain?  No/denies                       [facility] Adult PT Treatment/Exercise - 06/22/19 0001      Lumbar Exercises: Aerobic   Nustep  L4 x20min UE/LE, monitored       Knee/Hip Exercises: Supine   Short Arc Quad Sets  Strengthening;Both;3 sets;10 reps    Short Arc Quad Sets Limitations  4#    Bridges  Strengthening;20 reps    Other Supine Knee/Hip Exercises  clamshell with red tband x30          Balance Exercises - 06/22/19 0959      Balance Exercises: Standing   Standing Eyes Opened  Wide (BOA);Foam/compliant surface;Time   2 min   Step Ups  6 inch;Forward;UE support 2   2x10   Marching  Solid surface;Intermittent upper extremity assist;20 reps    Heel Raises  Both;20 reps    Toe Raise  Both;20 reps    Sit to Stand  Elevated surface   x10   Other Standing Exercises  toe taps 6" step uni UE             PT Long Term Goals - 06/22/19 0954      PT LONG TERM GOAL #1   Title  Patient will be independent with HEP    Time  6    Period  Weeks    Status  On-going      PT LONG TERM GOAL #2   Title  Patient will demonstrate 4+/5 bilateral LE MMT to improve stability during functional tasks.    Time  6    Period  Weeks    Status  On-going      PT LONG TERM GOAL #3   Title  Patient will perform modified 5x sit to stand test in 18 seconds or less to improve balance and decrease risk of falls.    Time  6    Period  Weeks    Status  On-going      PT LONG TERM GOAL #4   Title  Patient will improve Berg Balance Scale to 41/56 or greater to decrease risk of falls.    Time  6    Period  Weeks    Status  On-going            Plan - 06/22/19 1024    Clinical Impression Statement  Patient tolerated treatment well today. Patient able to progress with all LE strength exercises and balance exercises today. Patient required SBA for safety. Patient able to perform sit to stands yet reqired elevated surface and UE assist. Patient has reported no falls. Goals ongoing due to strength and balance deficits.    Personal Factors and Comorbidities  Comorbidity 1    Comorbidities  HTN    Examination-Activity Limitations   Stairs;Stand;Transfers;Locomotion Level    Stability/Clinical Decision Making  Stable/Uncomplicated    Rehab Potential  Good    PT Frequency  2x / week    PT Duration  6 weeks    PT Treatment/Interventions  ADLs/Self Care Home Management;Moist Heat;Cryotherapy;Gait training;Stair training;Functional mobility training;Therapeutic activities;Therapeutic exercise;Balance training;Neuromuscular re-education;Manual techniques;Patient/family education;Passive range of motion    PT Next Visit Plan  cont with LE strengthening and core stabilization, balance in sitting and standing.    Consulted and Agree with Plan of Care  Patient       Patient will benefit from skilled therapeutic intervention in order to improve the following deficits and impairments:  Difficulty walking, Decreased activity tolerance, Decreased balance, Decreased strength, Abnormal gait, Postural dysfunction  Visit Diagnosis: Unsteadiness on feet  Difficulty in walking, not elsewhere classified  Repeated falls     Problem List Patient Active Problem List   Diagnosis Date Noted  . Frequent falls 01/24/2019  . Hyponatremia 01/10/2019  . Depression, recurrent ([place]) 12/27/2018  . CKD (chronic kidney disease) stage 3, GFR 30-59 ml/min 03/04/2018  . Urinary urgency 01/13/2017  . Cognitive decline 11/27/2016  . Vitamin B 12 deficiency 04/12/2015  . Erectile dysfunction 07/07/2010  . CELLULITIS, RIGHT LEG 04/22/2009  . KNEE, ARTHRITIS, DEGEN./OSTEO 03/25/2009  . SUPERFICIAL PHLEBITIS 12/27/2008  . Hypothyroidism 05/08/2008  . Type 2 diabetes mellitus, controlled ([place]) 05/08/2008  . Hyperlipidemia 05/08/2008  . Essential hypertension 05/08/2008  . ECZEMA 05/08/2008    [name], PTA 06/22/2019, 10:31 AM  [facility] [place], [place], [address] Phone: [phone]   Fax:  [phone]  Name: [name] MRN: [id number] Date of Birth:  24-Sep-1935

## 2019-06-27 ENCOUNTER — Encounter: Payer: Self-pay | Admitting: Physical Therapy

## 2019-06-27 ENCOUNTER — Ambulatory Visit: Payer: Medicare HMO | Attending: Family Medicine | Admitting: Physical Therapy

## 2019-06-27 ENCOUNTER — Other Ambulatory Visit: Payer: Self-pay

## 2019-06-27 DIAGNOSIS — N3941 Urge incontinence: Secondary | ICD-10-CM | POA: Diagnosis not present

## 2019-06-27 DIAGNOSIS — R262 Difficulty in walking, not elsewhere classified: Secondary | ICD-10-CM

## 2019-06-27 DIAGNOSIS — R296 Repeated falls: Secondary | ICD-10-CM | POA: Insufficient documentation

## 2019-06-27 DIAGNOSIS — R2681 Unsteadiness on feet: Secondary | ICD-10-CM | POA: Insufficient documentation

## 2019-06-27 DIAGNOSIS — R350 Frequency of micturition: Secondary | ICD-10-CM | POA: Diagnosis not present

## 2019-06-27 NOTE — Therapy (Signed)
[facility] [place], [place], [address] Phone: [phone]   Fax:  [phone]  Physical Therapy Treatment  Patient Details  Name: [name] MRN: [id number] Date of Birth: 1935/09/24 Referring Provider (PT): Carolann Littler, MD   Encounter Date: 06/27/2019  PT End of Session - 06/27/19 1128    Visit Number  5    Number of Visits  12    Date for PT Re-Evaluation  07/27/19    Authorization Type  [employer]; Progress note every 10th visit; KX modifier at 15th visit    PT Start Time  1115    PT Stop Time  1200    PT Time Calculation (min)  45 min    Activity Tolerance  Patient tolerated treatment well    Behavior During Therapy  [facility] for tasks assessed/performed       Past Medical History:  Diagnosis Date  . CELLULITIS, RIGHT LEG 04/22/2009  . DM w/o complication type II [id number]  . ECZEMA 05/08/2008  . HYPERLIPIDEMIA 05/08/2008  . HYPOTHYROIDISM 05/08/2008  . KNEE, ARTHRITIS, DEGEN./OSTEO 03/25/2009  . SUPERFICIAL PHLEBITIS 12/27/2008  . Unspecified essential hypertension 05/08/2008    Past Surgical History:  Procedure Laterality Date  . CATARACT EXTRACTION  2005   bilateral  . CHOLECYSTECTOMY    . COLONOSCOPY  04-15-2004   tics  . KNEE SURGERY     TKR Dr Alma Friendly 2007  . nasal polyps    . TONSILLECTOMY  1946  . WRIST SURGERY  2015   right side    There were no vitals filed for this visit.  Subjective Assessment - 06/27/19 1127    Subjective  COVID-19 screening performed upon arrival. Patient reports doing well.    Pertinent History  HTN, B TKA    Limitations  Walking;House hold activities    Patient Stated Goals  improve balance, improve confidence and improve movement    Currently in Pain?  No/denies         [facility] PT Assessment - 06/27/19 0001      Assessment   Medical Diagnosis  balance problems    Referring Provider (PT)  Carolann Littler, MD    Next MD Visit  May 2021    Prior Therapy   yes      Precautions   Precautions  [facility] Adult PT Treatment/Exercise - 06/27/19 0001      Exercises   Exercises  Lumbar;Knee/Hip      Lumbar Exercises: Aerobic   Nustep  L4 x40min UE/LE, monitored      Knee/Hip Exercises: Seated   Long Arc Quad  Strengthening;Both;20 reps;Weights    Long Arc Quad Weight  5 lbs.    Hamstring Curl  Strengthening;Both;20 reps    Hamstring Limitations  red t-band    Sit to Sand  1 set;5 reps;without UE support          Balance Exercises - 06/27/19 1312      Balance Exercises: Standing   Standing Eyes Opened  Narrow base of support (BOS);Solid surface;Time;Head turns;Foam/compliant surface;Wide (BOA);5 reps   x1 min, head turns 2x1 min, 2x1 mins on foam   Rockerboard  Anterior/posterior;Other time (comment)   x3 minutes   Turning  3 reps;Both    Heel Raises  Both;20 reps    Toe Raise  Both;20 reps  PT Long Term Goals - 06/22/19 0954      PT LONG TERM GOAL #1   Title  Patient will be independent with HEP    Time  6    Period  Weeks    Status  On-going      PT LONG TERM GOAL #2   Title  Patient will demonstrate 4+/5 bilateral LE MMT to improve stability during functional tasks.    Time  6    Period  Weeks    Status  On-going      PT LONG TERM GOAL #3   Title  Patient will perform modified 5x sit to stand test in 18 seconds or less to improve balance and decrease risk of falls.    Time  6    Period  Weeks    Status  On-going      PT LONG TERM GOAL #4   Title  Patient will improve Berg Balance Scale to 41/56 or greater to decrease risk of falls.    Time  6    Period  Weeks    Status  On-going            Plan - 06/27/19 1316    Clinical Impression Statement  Patient responded well to therapy session. Patient able to perform balance activites with contact guard assist especially with standing on foam. Patient noted with more difficulties with sit to stands with no UE  support but reported it may have been due to fatigue.    Personal Factors and Comorbidities  Comorbidity 1    Examination-Activity Limitations  Stairs;Stand;Transfers;Locomotion Level    Stability/Clinical Decision Making  Stable/Uncomplicated    Clinical Decision Making  Low    Rehab Potential  Good    PT Frequency  2x / week    PT Duration  6 weeks    PT Treatment/Interventions  ADLs/Self Care Home Management;Moist Heat;Cryotherapy;Gait training;Stair training;Functional mobility training;Therapeutic activities;Therapeutic exercise;Balance training;Neuromuscular re-education;Manual techniques;Patient/family education;Passive range of motion    PT Next Visit Plan  cont with LE strengthening and core stabilization, balance in sitting and standing.    PT Home Exercise Plan  see patient education section    Consulted and Agree with Plan of Care  Patient       Patient will benefit from skilled therapeutic intervention in order to improve the following deficits and impairments:  Difficulty walking, Decreased activity tolerance, Decreased balance, Decreased strength, Abnormal gait, Postural dysfunction  Visit Diagnosis: Unsteadiness on feet  Difficulty in walking, not elsewhere classified  Repeated falls     Problem List Patient Active Problem List   Diagnosis Date Noted  . Frequent falls 01/24/2019  . Hyponatremia 01/10/2019  . Depression, recurrent ([place]) 12/27/2018  . CKD (chronic kidney disease) stage 3, GFR 30-59 ml/min 03/04/2018  . Urinary urgency 01/13/2017  . Cognitive decline 11/27/2016  . Vitamin B 12 deficiency 04/12/2015  . Erectile dysfunction 07/07/2010  . CELLULITIS, RIGHT LEG 04/22/2009  . KNEE, ARTHRITIS, DEGEN./OSTEO 03/25/2009  . SUPERFICIAL PHLEBITIS 12/27/2008  . Hypothyroidism 05/08/2008  . Type 2 diabetes mellitus, controlled ([place]) 05/08/2008  . Hyperlipidemia 05/08/2008  . Essential hypertension 05/08/2008  . ECZEMA 05/08/2008    Gabriela Eves  PT, DPT 06/27/2019, 1:17 PM,   [facility] [address] [place], [place], [address] Phone: [phone]   Fax:  [phone]  Name: [name] MRN: [id number] Date of Birth: Jun 11, 1935

## 2019-06-29 ENCOUNTER — Encounter: Payer: Medicare HMO | Admitting: Physical Therapy

## 2019-06-29 ENCOUNTER — Other Ambulatory Visit: Payer: Self-pay

## 2019-06-30 ENCOUNTER — Ambulatory Visit (INDEPENDENT_AMBULATORY_CARE_PROVIDER_SITE_OTHER): Payer: Medicare HMO | Admitting: Family Medicine

## 2019-06-30 ENCOUNTER — Encounter: Payer: Self-pay | Admitting: Family Medicine

## 2019-06-30 VITALS — BP 128/64 | HR 74 | Temp 97.6°F | Wt 181.3 lb

## 2019-06-30 DIAGNOSIS — R4189 Other symptoms and signs involving cognitive functions and awareness: Secondary | ICD-10-CM

## 2019-06-30 DIAGNOSIS — I10 Essential (primary) hypertension: Secondary | ICD-10-CM

## 2019-06-30 DIAGNOSIS — E119 Type 2 diabetes mellitus without complications: Secondary | ICD-10-CM | POA: Diagnosis not present

## 2019-06-30 LAB — POCT GLYCOSYLATED HEMOGLOBIN (HGB A1C): Hemoglobin A1C: 6.3 % — AB (ref 4.0–5.6)

## 2019-06-30 MED ORDER — MEMANTINE HCL 28 X 5 MG & 21 X 10 MG PO TABS: ORAL_TABLET | ORAL | 12 refills | Status: DC

## 2019-06-30 NOTE — Progress Notes (Signed)
Subjective:     Patient ID: [name], male   DOB: [birth year], 84 y.o.   MRN: [id number]  HPI   Dazion is here for medical follow-up.  He has history of type 2 diabetes, hypertension, hypothyroidism, hyperlipidemia, depression, cognitive impairment.  He is getting ready to have a bladder procedure done for some incontinence.  Wife states his memory is getting worse.  He tends to repeat himself more and has had progressive short term memory issues.  He takes Aricept 10 mg a day regularly.  Compliant with all other medications.  His diabetes has been well controlled.  A1c last visit 6.7% and stable at 6.3% today.  We reviewed his other labs from last visit back in the fall and these were stable.  He continues to have balance issues and has had some falls during the past year but no major injury.  He is getting regular physical therapy and they think this is helping.  He had depression several months ago and was started on Lexapro 10 mg daily.  Both patient and wife thinks that has helped.  His weight is up 3 pounds.  Wt Readings from Last 3 Encounters:  06/30/19 181 lb 4.8 oz (82.2 kg)  03/31/19 178 lb 8 oz (81 kg)  02/28/19 176 lb 4.8 oz (80 kg)   Past Medical History:  Diagnosis Date  . CELLULITIS, RIGHT LEG 04/22/2009  . DM w/o complication type II Q000111Q  . ECZEMA 05/08/2008  . HYPERLIPIDEMIA 05/08/2008  . HYPOTHYROIDISM 05/08/2008  . KNEE, ARTHRITIS, DEGEN./OSTEO 03/25/2009  . SUPERFICIAL PHLEBITIS 12/27/2008  . Unspecified essential hypertension 05/08/2008   Past Surgical History:  Procedure Laterality Date  . CATARACT EXTRACTION  2005   bilateral  . CHOLECYSTECTOMY    . COLONOSCOPY  04-15-2004   tics  . KNEE SURGERY     TKR Dr Alma Friendly 2007  . nasal polyps    . TONSILLECTOMY  1946  . WRIST SURGERY  2015   right side    reports that he has never smoked. He has never used smokeless tobacco. He reports that he does not drink alcohol or use drugs. family history includes  Arthritis in an other family member; Heart disease in an other family member; Hypertension in an other family member. No Known Allergies    Review of Systems  Constitutional: Negative for fatigue and unexpected weight change.  Eyes: Negative for visual disturbance.  Respiratory: Negative for cough, chest tightness and shortness of breath.   Cardiovascular: Negative for chest pain, palpitations and leg swelling.  Gastrointestinal: Negative for abdominal pain.  Genitourinary: Positive for frequency.  Neurological: Negative for dizziness, syncope, weakness, light-headedness and headaches.  Psychiatric/Behavioral: Negative for agitation.       Objective:   Physical Exam Vitals reviewed.  Constitutional:      Appearance: He is well-developed.  HENT:     Right Ear: External ear normal.     Left Ear: External ear normal.  Eyes:     Pupils: Pupils are equal, round, and reactive to light.  Neck:     Thyroid: No thyromegaly.  Cardiovascular:     Rate and Rhythm: Normal rate and regular rhythm.  Pulmonary:     Effort: Pulmonary effort is normal. No respiratory distress.     Breath sounds: Normal breath sounds. No wheezing or rales.  Musculoskeletal:     Cervical back: Neck supple.     Right lower leg: No edema.     Left lower leg: No edema.  Neurological:     Mental Status: He is alert and oriented to person, place, and time.        Assessment:     #1 type 2 diabetes well controlled with A1c 6.3%  #2 hypertension stable and at goal  #3 cognitive impairment with suspected Alzheimer's dementia.  Currently on Aricept and wife has noticed some progressive short-term memory loss    Plan:     -Start Namenda with starter pack given and give some feedback in 3 to 4 weeks.  If tolerating well at that time will refill at 10 mg twice daily  -Recheck MMSE at follow-up in 3 months  -Continue current regular medications  -Continue physical therapy for balance and fall prevention.   Handout given on fall prevention in the home  [name] MD [facility]

## 2019-06-30 NOTE — Patient Instructions (Signed)

## 2019-07-04 ENCOUNTER — Ambulatory Visit: Payer: Medicare HMO | Admitting: *Deleted

## 2019-07-04 ENCOUNTER — Other Ambulatory Visit: Payer: Self-pay

## 2019-07-04 DIAGNOSIS — R262 Difficulty in walking, not elsewhere classified: Secondary | ICD-10-CM | POA: Diagnosis not present

## 2019-07-04 DIAGNOSIS — R2681 Unsteadiness on feet: Secondary | ICD-10-CM | POA: Diagnosis not present

## 2019-07-04 DIAGNOSIS — R296 Repeated falls: Secondary | ICD-10-CM

## 2019-07-04 NOTE — Therapy (Signed)
[facility] [place], [place], [address] Phone: [phone]   Fax:  [phone]  Physical Therapy Treatment  Patient Details  Name: [name] MRN: [id number] Date of Birth: 1935-05-29 Referring Provider (PT): Carolann Littler, MD   Encounter Date: 07/04/2019  PT End of Session - 07/04/19 1612    Visit Number  6    Number of Visits  12    Date for PT Re-Evaluation  07/27/19    Authorization Type  [employer]; Progress note every 10th visit; KX modifier at 15th visit    PT Start Time  1600    PT Stop Time  1650    PT Time Calculation (min)  50 min       Past Medical History:  Diagnosis Date  . CELLULITIS, RIGHT LEG 04/22/2009  . DM w/o complication type II Q000111Q  . ECZEMA 05/08/2008  . HYPERLIPIDEMIA 05/08/2008  . HYPOTHYROIDISM 05/08/2008  . KNEE, ARTHRITIS, DEGEN./OSTEO 03/25/2009  . SUPERFICIAL PHLEBITIS 12/27/2008  . Unspecified essential hypertension 05/08/2008    Past Surgical History:  Procedure Laterality Date  . CATARACT EXTRACTION  2005   bilateral  . CHOLECYSTECTOMY    . COLONOSCOPY  04-15-2004   tics  . KNEE SURGERY     TKR Dr Alma Friendly 2007  . nasal polyps    . TONSILLECTOMY  1946  . WRIST SURGERY  2015   right side    There were no vitals filed for this visit.  Subjective Assessment - 07/04/19 1610    Subjective  COVID-19 screening performed upon arrival. Patient reports doing well.Did ok after last Rx    Pertinent History  HTN, B TKA    Limitations  Walking;House hold activities    Patient Stated Goals  improve balance, improve confidence and improve movement                       [facility] Adult PT Treatment/Exercise - 07/04/19 0001      Exercises   Exercises  Lumbar;Knee/Hip      Lumbar Exercises: Aerobic   Nustep  L4 x51min UE/LE, monitored      Knee/Hip Exercises: Seated   Long Arc Quad  Strengthening;Both;20 reps;Weights    Long Arc Quad Weight  5 lbs.    Sit  to Sand  1 set;without UE support;10 reps          Balance Exercises - 07/04/19 1615      Balance Exercises: Standing   Standing Eyes Opened  Narrow base of support (BOS);Solid surface;Time;Head turns;Foam/compliant surface;Wide (BOA);5 reps    Standing Eyes Closed  Wide (BOA);Narrow base of support (BOS);Foam/compliant surface    Rockerboard  Anterior/posterior;Other time (comment)   x3 minutes   Partial Tandem Stance  Eyes open;4 reps;10 secs    Turning  3 reps;Both    Heel Raises  Both;20 reps    Toe Raise  Both;20 reps             PT Long Term Goals - 06/22/19 0954      PT LONG TERM GOAL #1   Title  Patient will be independent with HEP    Time  6    Period  Weeks    Status  On-going      PT LONG TERM GOAL #2   Title  Patient will demonstrate 4+/5 bilateral LE MMT to improve stability during functional tasks.    Time  6    Period  Weeks  Status  On-going      PT LONG TERM GOAL #3   Title  Patient will perform modified 5x sit to stand test in 18 seconds or less to improve balance and decrease risk of falls.    Time  6    Period  Weeks    Status  On-going      PT LONG TERM GOAL #4   Title  Patient will improve Berg Balance Scale to 41/56 or greater to decrease risk of falls.    Time  6    Period  Weeks    Status  On-going            Plan - 07/04/19 1659    Clinical Impression Statement  Pt arrived today doing fairly well and was able to perform all LE atrengthening. exs and balance act's. Pt  did better with sit to stand after verbal and tactile cues for technique and balance.    Personal Factors and Comorbidities  Comorbidity 1    Examination-Activity Limitations  Stairs;Stand;Transfers;Locomotion Level    Stability/Clinical Decision Making  Stable/Uncomplicated    Rehab Potential  Good    PT Frequency  2x / week    PT Duration  6 weeks    PT Treatment/Interventions  ADLs/Self Care Home Management;Moist Heat;Cryotherapy;Gait training;Stair  training;Functional mobility training;Therapeutic activities;Therapeutic exercise;Balance training;Neuromuscular re-education;Manual techniques;Patient/family education;Passive range of motion    PT Next Visit Plan  cont with LE strengthening and core stabilization, balance in sitting and standing.    PT Home Exercise Plan  see patient education section       Patient will benefit from skilled therapeutic intervention in order to improve the following deficits and impairments:  Difficulty walking, Decreased activity tolerance, Decreased balance, Decreased strength, Abnormal gait, Postural dysfunction  Visit Diagnosis: Difficulty in walking, not elsewhere classified  Unsteadiness on feet  Repeated falls     Problem List Patient Active Problem List   Diagnosis Date Noted  . Frequent falls 01/24/2019  . Hyponatremia 01/10/2019  . Depression, recurrent ([place]) 12/27/2018  . CKD (chronic kidney disease) stage 3, GFR 30-59 ml/min 03/04/2018  . Urinary urgency 01/13/2017  . Cognitive decline 11/27/2016  . Vitamin B 12 deficiency 04/12/2015  . Erectile dysfunction 07/07/2010  . CELLULITIS, RIGHT LEG 04/22/2009  . KNEE, ARTHRITIS, DEGEN./OSTEO 03/25/2009  . SUPERFICIAL PHLEBITIS 12/27/2008  . Hypothyroidism 05/08/2008  . Type 2 diabetes mellitus, controlled ([place]) 05/08/2008  . Hyperlipidemia 05/08/2008  . Essential hypertension 05/08/2008  . ECZEMA 05/08/2008    Annel Zunker,CHRIS, PTA 07/04/2019, 5:13 PM  [facility] [place], [place], [address] Phone: [phone]   Fax:  [phone]  Name: [name] MRN: [id number] Date of Birth: 1935-11-08

## 2019-07-06 DIAGNOSIS — E1151 Type 2 diabetes mellitus with diabetic peripheral angiopathy without gangrene: Secondary | ICD-10-CM | POA: Diagnosis not present

## 2019-07-06 DIAGNOSIS — L84 Corns and callosities: Secondary | ICD-10-CM | POA: Diagnosis not present

## 2019-07-06 DIAGNOSIS — B351 Tinea unguium: Secondary | ICD-10-CM | POA: Diagnosis not present

## 2019-07-06 DIAGNOSIS — M79676 Pain in unspecified toe(s): Secondary | ICD-10-CM | POA: Diagnosis not present

## 2019-07-07 DIAGNOSIS — N3941 Urge incontinence: Secondary | ICD-10-CM | POA: Diagnosis not present

## 2019-07-14 DIAGNOSIS — R350 Frequency of micturition: Secondary | ICD-10-CM | POA: Diagnosis not present

## 2019-07-14 DIAGNOSIS — N3941 Urge incontinence: Secondary | ICD-10-CM | POA: Diagnosis not present

## 2019-07-18 ENCOUNTER — Ambulatory Visit: Payer: Medicare HMO | Admitting: *Deleted

## 2019-07-18 ENCOUNTER — Other Ambulatory Visit: Payer: Self-pay

## 2019-07-18 DIAGNOSIS — R296 Repeated falls: Secondary | ICD-10-CM

## 2019-07-18 DIAGNOSIS — R262 Difficulty in walking, not elsewhere classified: Secondary | ICD-10-CM | POA: Diagnosis not present

## 2019-07-18 DIAGNOSIS — R2681 Unsteadiness on feet: Secondary | ICD-10-CM | POA: Diagnosis not present

## 2019-07-18 NOTE — Therapy (Signed)
[facility] [place], [place], [address] Phone: [phone]   Fax:  [phone]  Physical Therapy Treatment  Patient Details  Name: [name] MRN: [id number] Date of Birth: [birth year] Referring Provider (PT): Carolann Littler, MD   Encounter Date: 07/18/2019  PT End of Session - 07/18/19 1536    Visit Number  7    Number of Visits  12    Date for PT Re-Evaluation  07/27/19    Authorization Type  [employer]; Progress note every 10th visit; KX modifier at 15th visit    PT Start Time  1515    PT Stop Time  1601    PT Time Calculation (min)  46 min    Equipment Utilized During Treatment  Gait belt    Activity Tolerance  Patient tolerated treatment well    Behavior During Therapy  [facility] for tasks assessed/performed       Past Medical History:  Diagnosis Date  . CELLULITIS, RIGHT LEG 04/22/2009  . DM w/o complication type II [id number]  . ECZEMA 05/08/2008  . HYPERLIPIDEMIA 05/08/2008  . HYPOTHYROIDISM 05/08/2008  . KNEE, ARTHRITIS, DEGEN./OSTEO 03/25/2009  . SUPERFICIAL PHLEBITIS 12/27/2008  . Unspecified essential hypertension 05/08/2008    Past Surgical History:  Procedure Laterality Date  . CATARACT EXTRACTION  2005   bilateral  . CHOLECYSTECTOMY    . COLONOSCOPY  04-15-2004   tics  . KNEE SURGERY     TKR Dr Alma Friendly 2007  . nasal polyps    . TONSILLECTOMY  1946  . WRIST SURGERY  2015   right side    There were no vitals filed for this visit.  Subjective Assessment - 07/18/19 1532    Subjective  COVID-19 screening performed upon arrival. Patient reports doing ok today.    Pertinent History  HTN, B TKA    Limitations  Walking;House hold activities                        [facility] Adult PT Treatment/Exercise - 07/18/19 0001      Exercises   Exercises  Lumbar;Knee/Hip      Lumbar Exercises: Aerobic   Nustep  L4 x82min UE/LE, monitored      Knee/Hip Exercises: Seated   Long Arc Quad   Strengthening;Both;3 sets;10 reps    Long Arc Quad Weight  5 lbs.    Sit to Sand  1 set;10 reps;without UE support   cues for center of gravity and leaning forward         Balance Exercises - 07/18/19 1551      Balance Exercises: Standing   Standing Eyes Opened  Narrow base of support (BOS);Solid surface;Time;Head turns;Foam/compliant surface;Wide (BOA);5 reps    Standing, One Foot on a Step  6 inch;Eyes open;4 reps;10 secs    Rockerboard  Anterior/posterior;Other time (comment)   x3 minutes   Other Standing Exercises  toe taps 6" step uni UE   forward and from the side x 4 each             PT Long Term Goals - 06/22/19 0954      PT LONG TERM GOAL #1   Title  Patient will be independent with HEP    Time  6    Period  Weeks    Status  On-going      PT LONG TERM GOAL #2   Title  Patient will demonstrate 4+/5 bilateral LE MMT to improve stability during functional  tasks.    Time  6    Period  Weeks    Status  On-going      PT LONG TERM GOAL #3   Title  Patient will perform modified 5x sit to stand test in 18 seconds or less to improve balance and decrease risk of falls.    Time  6    Period  Weeks    Status  On-going      PT LONG TERM GOAL #4   Title  Patient will improve Berg Balance Scale to 41/56 or greater to decrease risk of falls.    Time  6    Period  Weeks    Status  On-going            Plan - 07/18/19 1610    Clinical Impression Statement  Pt arrived today doing fairly well and was guided through balance act.'s as well as some LE strengthenig exs. He needed SBA and UE assistance at times during balance act's, but less than before. Cues still needed for sit to stand technique and his center of gravity. Continue with progression    Personal Factors and Comorbidities  Comorbidity 1    Comorbidities  HTN    Examination-Activity Limitations  Stairs;Stand;Transfers;Locomotion Level    Stability/Clinical Decision Making  Stable/Uncomplicated    Rehab  Potential  Good    PT Frequency  2x / week    PT Duration  6 weeks    PT Treatment/Interventions  ADLs/Self Care Home Management;Moist Heat;Cryotherapy;Gait training;Stair training;Functional mobility training;Therapeutic activities;Therapeutic exercise;Balance training;Neuromuscular re-education;Manual techniques;Patient/family education;Passive range of motion    PT Next Visit Plan  cont with LE strengthening and core stabilization, balance in sitting and standing.       Patient will benefit from skilled therapeutic intervention in order to improve the following deficits and impairments:     Visit Diagnosis: Difficulty in walking, not elsewhere classified  Unsteadiness on feet  Repeated falls     Problem List Patient Active Problem List   Diagnosis Date Noted  . Frequent falls 01/24/2019  . Hyponatremia 01/10/2019  . Depression, recurrent ([place]) 12/27/2018  . CKD (chronic kidney disease) stage 3, GFR 30-59 ml/min 03/04/2018  . Urinary urgency 01/13/2017  . Cognitive decline 11/27/2016  . Vitamin B 12 deficiency 04/12/2015  . Erectile dysfunction 07/07/2010  . CELLULITIS, RIGHT LEG 04/22/2009  . KNEE, ARTHRITIS, DEGEN./OSTEO 03/25/2009  . SUPERFICIAL PHLEBITIS 12/27/2008  . Hypothyroidism 05/08/2008  . Type 2 diabetes mellitus, controlled ([place]) 05/08/2008  . Hyperlipidemia 05/08/2008  . Essential hypertension 05/08/2008  . ECZEMA 05/08/2008    Roshanna Cimino,CHRIS , PTA 07/18/2019, 4:21 PM  [facility] [place], [place], [address] Phone: [phone]   Fax:  [phone]  Name: [name] MRN: [id number] Date of Birth: January 13, 1936

## 2019-07-20 ENCOUNTER — Encounter: Payer: Self-pay | Admitting: Physical Therapy

## 2019-07-20 ENCOUNTER — Ambulatory Visit: Payer: Medicare HMO | Admitting: Physical Therapy

## 2019-07-20 ENCOUNTER — Other Ambulatory Visit: Payer: Self-pay

## 2019-07-20 DIAGNOSIS — R2681 Unsteadiness on feet: Secondary | ICD-10-CM

## 2019-07-20 DIAGNOSIS — R296 Repeated falls: Secondary | ICD-10-CM | POA: Diagnosis not present

## 2019-07-20 DIAGNOSIS — R262 Difficulty in walking, not elsewhere classified: Secondary | ICD-10-CM

## 2019-07-20 NOTE — Therapy (Signed)
[facility] [place], [place], [address] Phone: [phone]   Fax:  [phone]  Physical Therapy Treatment  Patient Details  Name: [name] MRN: [id number] Date of Birth: 09/18/1935 Referring Provider (PT): Carolann Littler, MD   Encounter Date: 07/20/2019  PT End of Session - 07/20/19 1606    Visit Number  8    Number of Visits  12    Date for PT Re-Evaluation  07/27/19    Authorization Type  [employer]; Progress note every 10th visit; KX modifier at 15th visit    PT Start Time  1518    PT Stop Time  1602    PT Time Calculation (min)  44 min    Activity Tolerance  Patient tolerated treatment well    Behavior During Therapy  [facility] for tasks assessed/performed       Past Medical History:  Diagnosis Date  . CELLULITIS, RIGHT LEG 04/22/2009  . DM w/o complication type II 8/54/6270  . ECZEMA 05/08/2008  . HYPERLIPIDEMIA 05/08/2008  . HYPOTHYROIDISM 05/08/2008  . KNEE, ARTHRITIS, DEGEN./OSTEO 03/25/2009  . SUPERFICIAL PHLEBITIS 12/27/2008  . Unspecified essential hypertension 05/08/2008    Past Surgical History:  Procedure Laterality Date  . CATARACT EXTRACTION  2005   bilateral  . CHOLECYSTECTOMY    . COLONOSCOPY  04-15-2004   tics  . KNEE SURGERY     TKR Dr Alma Friendly 2007  . nasal polyps    . TONSILLECTOMY  1946  . WRIST SURGERY  2015   right side    There were no vitals filed for this visit.  Subjective Assessment - 07/20/19 1610    Subjective  COVID-19 screening performed upon arrival. Patient reports dong great today with no falls recently.    Pertinent History  HTN, B TKA    Limitations  Walking;House hold activities    Patient Stated Goals  improve balance, improve confidence and improve movement    Currently in Pain?  No/denies         [facility] PT Assessment - 07/20/19 0001      Assessment   Medical Diagnosis  balance problems    Referring Provider (PT)  Carolann Littler, MD    Next MD Visit   May 2021    Prior Therapy  yes      Precautions   Precautions  Fall      Transfers   Five time sit to stand comments   19 seconds      Berg Balance Test   Sit to Stand  Able to stand  independently using hands    Standing Unsupported  Able to stand safely 2 minutes    Sitting with Back Unsupported but Feet Supported on Floor or Stool  Able to sit safely and securely 2 minutes    Stand to Sit  Controls descent by using hands    Transfers  Able to transfer safely, definite need of hands    Standing Unsupported with Eyes Closed  Able to stand 10 seconds safely    Standing Unsupported with Feet Together  Able to place feet together independently and stand 1 minute safely    From Standing, Reach Forward with Outstretched Arm  Can reach confidently >25 cm (10")    From Standing Position, Pick up Object from Floor  Able to pick up shoe, needs supervision    From Standing Position, Turn to Look Behind Over each Shoulder  Looks behind one side only/other side shows less weight shift  Turn 360 Degrees  Able to turn 360 degrees safely one side only in 4 seconds or less    Standing Unsupported, Alternately Place Feet on Step/Stool  Able to complete 4 steps without aid or supervision    Standing Unsupported, One Foot in Front  Needs help to step but can hold 15 seconds    Standing on One Leg  Unable to try or needs assist to prevent fall    Total Score  41                    [facility] Adult PT Treatment/Exercise - 07/20/19 0001      Lumbar Exercises: Aerobic   Nustep  L4 x25mn UE/LE, monitored      Knee/Hip Exercises: Seated   Long Arc Quad  Strengthening;Both;3 sets;10 reps    Long Arc Quad Weight  5 lbs.    Clamshell with TheraBand  Green    Marching  Strengthening;Both;3 sets;10 reps    Hamstring Curl  Strengthening;Both;20 reps    Hamstring Limitations  green XTS    Sit to Sand  1 set;10 reps;without UE support                  PT Long Term Goals - 07/20/19  1610      PT LONG TERM GOAL #1   Title  Patient will be independent with HEP    Time  6    Period  Weeks    Status  Achieved      PT LONG TERM GOAL #2   Title  Patient will demonstrate 4+/5 bilateral LE MMT to improve stability during functional tasks.    Time  6    Period  Weeks    Status  On-going      PT LONG TERM GOAL #3   Title  Patient will perform modified 5x sit to stand test in 18 seconds or less to improve balance and decrease risk of falls.    Time  6    Period  Weeks    Status  On-going      PT LONG TERM GOAL #4   Title  Patient will improve Berg Balance Scale to 41/56 or greater to decrease risk of falls.    Time  6    Period  Weeks    Status  Achieved            Plan - 07/20/19 1606    Clinical Impression Statement  Patient responded well to therapy session. Berg Balance Score of 41/56 categorizes him a a moderate fall risk. Significant improvements since intial evaluation.  Stand by supervision required with no loss of balance. Goals are progressing but others are not met at this time.    Personal Factors and Comorbidities  Comorbidity 1    Comorbidities  HTN    Examination-Activity Limitations  Stairs;Stand;Transfers;Locomotion Level    Stability/Clinical Decision Making  Stable/Uncomplicated    Clinical Decision Making  Low    Rehab Potential  Good    PT Frequency  2x / week    PT Duration  6 weeks    PT Treatment/Interventions  ADLs/Self Care Home Management;Moist Heat;Cryotherapy;Gait training;Stair training;Functional mobility training;Therapeutic activities;Therapeutic exercise;Balance training;Neuromuscular re-education;Manual techniques;Patient/family education;Passive range of motion    PT Next Visit Plan  cont with LE strengthening and core stabilization, balance in sitting and standing.    PT Home Exercise Plan  see patient education section    Consulted and Agree with Plan of Care  Patient       Patient will benefit from skilled therapeutic  intervention in order to improve the following deficits and impairments:  Difficulty walking, Decreased activity tolerance, Decreased balance, Decreased strength, Abnormal gait, Postural dysfunction  Visit Diagnosis: Difficulty in walking, not elsewhere classified  Unsteadiness on feet  Repeated falls     Problem List Patient Active Problem List   Diagnosis Date Noted  . Frequent falls 01/24/2019  . Hyponatremia 01/10/2019  . Depression, recurrent ([place]) 12/27/2018  . CKD (chronic kidney disease) stage 3, GFR 30-59 ml/min 03/04/2018  . Urinary urgency 01/13/2017  . Cognitive decline 11/27/2016  . Vitamin B 12 deficiency 04/12/2015  . Erectile dysfunction 07/07/2010  . CELLULITIS, RIGHT LEG 04/22/2009  . KNEE, ARTHRITIS, DEGEN./OSTEO 03/25/2009  . SUPERFICIAL PHLEBITIS 12/27/2008  . Hypothyroidism 05/08/2008  . Type 2 diabetes mellitus, controlled ([place]) 05/08/2008  . Hyperlipidemia 05/08/2008  . Essential hypertension 05/08/2008  . ECZEMA 05/08/2008    Gabriela Eves, PT, DPT 07/20/2019, 4:11 PM  [facility] [address] [place], [place], [address] Phone: [phone]   Fax:  [phone]  Name: [name] MRN: [id number] Date of Birth: Aug 17, 1935

## 2019-07-25 ENCOUNTER — Other Ambulatory Visit: Payer: Self-pay

## 2019-07-25 ENCOUNTER — Ambulatory Visit: Payer: Medicare HMO | Attending: Family Medicine | Admitting: Physical Therapy

## 2019-07-25 ENCOUNTER — Encounter: Payer: Self-pay | Admitting: Physical Therapy

## 2019-07-25 DIAGNOSIS — R2681 Unsteadiness on feet: Secondary | ICD-10-CM | POA: Insufficient documentation

## 2019-07-25 DIAGNOSIS — R296 Repeated falls: Secondary | ICD-10-CM | POA: Diagnosis not present

## 2019-07-25 DIAGNOSIS — R262 Difficulty in walking, not elsewhere classified: Secondary | ICD-10-CM

## 2019-07-25 NOTE — Therapy (Signed)
[facility] [place], [place], [address] Phone: [phone]   Fax:  [phone]  Physical Therapy Treatment  Patient Details  Name: [name] MRN: [id number] Date of Birth: [birth year] Referring Provider (PT): Carolann Littler, MD   Encounter Date: 07/25/2019  PT End of Session - 07/25/19 1358    Visit Number  9    Number of Visits  12    Date for PT Re-Evaluation  09/08/19    Authorization Type  [employer]; Progress note every 10th visit; KX modifier at 15th visit    Authorization Time Period  Re-cert sent on 0000000 for 8 additional visits    Authorization - Visit Number  9    Authorization - Number of Visits  12    PT Start Time  [id number]    PT Stop Time  1430    PT Time Calculation (min)  38 min    Activity Tolerance  Patient tolerated treatment well    Behavior During Therapy  [facility] for tasks assessed/performed       Past Medical History:  Diagnosis Date  . CELLULITIS, RIGHT LEG 04/22/2009  . DM w/o complication type II [id number]  . ECZEMA 05/08/2008  . HYPERLIPIDEMIA 05/08/2008  . HYPOTHYROIDISM 05/08/2008  . KNEE, ARTHRITIS, DEGEN./OSTEO 03/25/2009  . SUPERFICIAL PHLEBITIS 12/27/2008  . Unspecified essential hypertension 05/08/2008    Past Surgical History:  Procedure Laterality Date  . CATARACT EXTRACTION  2005   bilateral  . CHOLECYSTECTOMY    . COLONOSCOPY  04-15-2004   tics  . KNEE SURGERY     TKR Dr Alma Friendly 2007  . nasal polyps    . TONSILLECTOMY  1946  . WRIST SURGERY  2015   right side    There were no vitals filed for this visit.  Subjective Assessment - 07/25/19 1355    Subjective  COVID-19 screening performed upon arrival. Patient reporting his main concern is his balance.    Pertinent History  HTN, B TKA    Limitations  Walking;House hold activities    Patient Stated Goals  improve balance, improve confidence and improve movement    Currently in Pain?  No/denies         [facility] PT  Assessment - 07/25/19 0001      Assessment   Medical Diagnosis  balance deficit    Referring Provider (PT)  Carolann Littler, MD    Next MD Visit  May 2021    Prior Therapy  yes      Precautions   Precautions  Fall      Transfers   Five time sit to stand comments   20.2 seconds with UE support      Berg Balance Test   [place] comment:  [id number]                    [facility] Adult PT Treatment/Exercise - 07/25/19 0001      Lumbar Exercises: Aerobic   Nustep  L5 x 10 minutes      Knee/Hip Exercises: Standing   Other Standing Knee Exercises  4 way hip exercises in parallel bars wtih UE support x 15 reps each      Knee/Hip Exercises: Seated   Long Arc Quad  Strengthening;Both;3 sets;10 reps    Long Arc Quad Weight  5 lbs.    Clamshell with TheraBand  Green   x 20 reps   Marching  Strengthening;Both;3 sets;10 reps    Hamstring Curl  Strengthening;Both;20 reps  Hamstring Limitations  green XTS    Sit to Sand  15 reps;with UE support          Balance Exercises - 07/25/19 1424      Balance Exercises: Standing   Standing, One Foot on a Step  6 inch;Eyes open;4 reps;10 secs    Rockerboard  Anterior/posterior;Other time (comment)   x3 minutes   Marching  Head turns;20 reps;Limitations    Marching Limitations  CGA    Other Standing Exercises  toe taps and heel taps 6" step single UE forward and from the side x 4 each    Other Standing Exercises Comments  calf raises x 20 reps with UE support        PT Education - 07/25/19 1356    Education Details  Discussed PT Plan and HEP.    Person(s) Educated  Patient    Methods  Explanation;Demonstration    Comprehension  Verbalized understanding;Returned demonstration          PT Long Term Goals - 07/25/19 1413      PT LONG TERM GOAL #1   Title  Patient will be independent with HEP      PT LONG TERM GOAL #2   Title  Patient will demonstrate 4+/5 bilateral LE MMT to improve stability during functional tasks.     Time  6    Status  On-going      PT LONG TERM GOAL #3   Title  Patient will perform modified 5x sit to stand test in 18 seconds or less to improve balance and decrease risk of falls.    Status  On-going      PT LONG TERM GOAL #4   Title  Patient will improve Berg Balance Scale to 47/56 or greater to decrease risk of falls. (updated on 07/25/2019 from 41 to 47/56).    Baseline  41/56    Time  6    Period  Weeks    Status  New    Target Date  09/08/19      PT LONG TERM GOAL #5   Title  Pt will be able to navigate 1 light of stairs with single hand rail for assistance safely.    Time  6    Period  Weeks    Status  New            Plan - 07/25/19 1403    Clinical Impression Statement  Pt arriving 7 minutes late to therapy session today. Pt reporting no pain. Disussed re-certificaiton with pt and PT POC for increased number of visits. Pt is making progress but still progressing toward his LTG's set at initial evaluation. Pt still classified as moderate fall risk based on BERG score of 41/56. Recommending continuing therpay to progress balance, gait, LE strength, and functional mobility in order to improve quality of life and decrease fall risk.    Personal Factors and Comorbidities  Comorbidity 1    Comorbidities  HTN    Examination-Activity Limitations  Stairs;Stand;Transfers;Locomotion Level    Stability/Clinical Decision Making  Stable/Uncomplicated    Rehab Potential  Good    PT Frequency  2x / week    PT Duration  6 weeks    PT Treatment/Interventions  ADLs/Self Care Home Management;Moist Heat;Cryotherapy;Gait training;Stair training;Functional mobility training;Therapeutic activities;Therapeutic exercise;Balance training;Neuromuscular re-education;Manual techniques;Patient/family education;Passive range of motion    PT Next Visit Plan  cont with LE strengthening and core stabilization, balance in sitting and standing.    PT Home Exercise Plan  see patient education section     Consulted and Agree with Plan of Care  Patient       Patient will benefit from skilled therapeutic intervention in order to improve the following deficits and impairments:  Difficulty walking, Decreased activity tolerance, Decreased balance, Decreased strength, Abnormal gait, Postural dysfunction  Visit Diagnosis: Difficulty in walking, not elsewhere classified  Unsteadiness on feet  Repeated falls     Problem List Patient Active Problem List   Diagnosis Date Noted  . Frequent falls 01/24/2019  . Hyponatremia 01/10/2019  . Depression, recurrent ([place]) 12/27/2018  . CKD (chronic kidney disease) stage 3, GFR 30-59 ml/min 03/04/2018  . Urinary urgency 01/13/2017  . Cognitive decline 11/27/2016  . Vitamin B 12 deficiency 04/12/2015  . Erectile dysfunction 07/07/2010  . CELLULITIS, RIGHT LEG 04/22/2009  . KNEE, ARTHRITIS, DEGEN./OSTEO 03/25/2009  . SUPERFICIAL PHLEBITIS 12/27/2008  . Hypothyroidism 05/08/2008  . Type 2 diabetes mellitus, controlled ([place]) 05/08/2008  . Hyperlipidemia 05/08/2008  . Essential hypertension 05/08/2008  . ECZEMA 05/08/2008    Oretha Caprice, PT, MPT 07/25/2019, 2:29 PM  [facility] [address] [place], [place], [address] Phone: [phone]   Fax:  [phone]  Name: [name] MRN: [id number] Date of Birth: 02-Sep-1935

## 2019-07-31 ENCOUNTER — Ambulatory Visit: Payer: Medicare HMO | Admitting: *Deleted

## 2019-07-31 ENCOUNTER — Other Ambulatory Visit: Payer: Self-pay

## 2019-07-31 DIAGNOSIS — R296 Repeated falls: Secondary | ICD-10-CM | POA: Diagnosis not present

## 2019-07-31 DIAGNOSIS — R2681 Unsteadiness on feet: Secondary | ICD-10-CM

## 2019-07-31 DIAGNOSIS — R262 Difficulty in walking, not elsewhere classified: Secondary | ICD-10-CM | POA: Diagnosis not present

## 2019-07-31 NOTE — Therapy (Addendum)
[facility] [place], [place], [address] Phone: [phone]   Fax:  [phone]  Physical Therapy Treatment Progress Note Reporting Period 06/08/2019 to 07/31/2019  See note below for Objective Data and Assessment of Progress/Goals. Goals ongoing at this time with some improvements with strength and balance. Gabriela Eves, PT, DPT       Patient Details  Name: [name] MRN: [id number] Date of Birth: 04-18-1935 Referring Provider (PT): Carolann Littler, MD   Encounter Date: 07/31/2019  PT End of Session - 07/31/19 1403    Visit Number  10    Number of Visits  20    Date for PT Re-Evaluation  09/08/19    Authorization Type  [employer]; Progress note every 10th visit; KX modifier at 15th visit    Authorization Time Period  Re-cert sent on 08/26/812 for 8 additional visits    Authorization - Visit Number  10    Authorization - Number of Visits  12    PT Start Time  1301    PT Stop Time  4818    PT Time Calculation (min)  46 min       Past Medical History:  Diagnosis Date  . CELLULITIS, RIGHT LEG 04/22/2009  . DM w/o complication type II 5/63/1497  . ECZEMA 05/08/2008  . HYPERLIPIDEMIA 05/08/2008  . HYPOTHYROIDISM 05/08/2008  . KNEE, ARTHRITIS, DEGEN./OSTEO 03/25/2009  . SUPERFICIAL PHLEBITIS 12/27/2008  . Unspecified essential hypertension 05/08/2008    Past Surgical History:  Procedure Laterality Date  . CATARACT EXTRACTION  2005   bilateral  . CHOLECYSTECTOMY    . COLONOSCOPY  04-15-2004   tics  . KNEE SURGERY     TKR Dr Alma Friendly 2007  . nasal polyps    . TONSILLECTOMY  1946  . WRIST SURGERY  2015   right side    There were no vitals filed for this visit.                     [facility] Adult PT Treatment/Exercise - 07/31/19 0001      Transfers   Five time sit to stand comments   19 secs      Knee/Hip Exercises: Standing   Other Standing Knee Exercises  4 way hip exercises in  parallel bars wtih UE support x 15 reps each    Other Standing Knee Exercises  Resisted walking 4 planes  each way x 10 with CGA      Knee/Hip Exercises: Seated   Sit to Sand  15 reps;with UE support   cues for nose over toes         Balance Exercises - 07/31/19 0001      Balance Exercises: Standing   Standing Eyes Opened  Narrow base of support (BOS);Solid surface;Time;Head turns;Foam/compliant surface;Wide (BOA);5 reps    Tandem Stance  Eyes open;4 reps;10 secs    Standing, One Foot on a Step  6 inch;Eyes open;4 reps;10 secs    Rockerboard  Anterior/posterior;Other time (comment)    Marching  Head turns;20 reps;Limitations;Foam/compliant surface   airex pad   Marching Limitations  CGA    Other Standing Exercises  toe taps 6" step single UE forward and from the side 3x10             PT Long Term Goals - 07/31/19 1408      PT LONG TERM GOAL #4   Title  Patient will improve Berg Balance Scale to 47/56 or greater to decrease  risk of falls. (updated on 07/25/2019 from 41 to 47/56).    Baseline  41/56    Time  6    Period  Weeks    Status  On-going      PT LONG TERM GOAL #5   Title  Pt will be able to navigate 1 light of stairs with single hand rail for assistance safely.    Time  6    Period  Weeks    Status  On-going            Plan - 07/31/19 1413    Clinical Impression Statement  Pt arrived today doing fairly well and feels that he is improving with strength and balance , but wants to cont, with PT. He did better with some of the balancing exs today, but continues to need CGA/ SBA on most balnce act.'s. Resisted walking was challenging for Pt , but did well with CGA.    Personal Factors and Comorbidities  Comorbidity 1    Comorbidities  HTN    Examination-Activity Limitations  Stairs;Stand;Transfers;Locomotion Level    Stability/Clinical Decision Making  Stable/Uncomplicated    Rehab Potential  Good    PT Frequency  2x / week    PT Duration  6 weeks    PT  Treatment/Interventions  ADLs/Self Care Home Management;Moist Heat;Cryotherapy;Gait training;Stair training;Functional mobility training;Therapeutic activities;Therapeutic exercise;Balance training;Neuromuscular re-education;Manual techniques;Patient/family education;Passive range of motion    PT Next Visit Plan  cont with LE strengthening and core stabilization, balance in sitting and standing.    PT Home Exercise Plan  see patient education section    Consulted and Agree with Plan of Care  Patient       Patient will benefit from skilled therapeutic intervention in order to improve the following deficits and impairments:  Difficulty walking, Decreased activity tolerance, Decreased balance, Decreased strength, Abnormal gait, Postural dysfunction  Visit Diagnosis: Difficulty in walking, not elsewhere classified - Plan: PT plan of care cert/re-cert  Unsteadiness on feet - Plan: PT plan of care cert/re-cert  Repeated falls - Plan: PT plan of care cert/re-cert     Problem List Patient Active Problem List   Diagnosis Date Noted  . Frequent falls 01/24/2019  . Hyponatremia 01/10/2019  . Depression, recurrent ([place]) 12/27/2018  . CKD (chronic kidney disease) stage 3, GFR 30-59 ml/min 03/04/2018  . Urinary urgency 01/13/2017  . Cognitive decline 11/27/2016  . Vitamin B 12 deficiency 04/12/2015  . Erectile dysfunction 07/07/2010  . CELLULITIS, RIGHT LEG 04/22/2009  . KNEE, ARTHRITIS, DEGEN./OSTEO 03/25/2009  . SUPERFICIAL PHLEBITIS 12/27/2008  . Hypothyroidism 05/08/2008  . Type 2 diabetes mellitus, controlled ([place]) 05/08/2008  . Hyperlipidemia 05/08/2008  . Essential hypertension 05/08/2008  . ECZEMA 05/08/2008    Morganna Styles, CHRIS , PTA 07/31/2019, 3:33 PM  [facility] [address] [place], [place], [address] Phone: [phone]   Fax:  [phone]  Name: IRISH PIECH  MRN: [id number] Date of Birth: Dec 16, 1935

## 2019-08-01 ENCOUNTER — Ambulatory Visit: Payer: Medicare HMO | Admitting: Physical Therapy

## 2019-08-01 ENCOUNTER — Telehealth: Payer: Self-pay | Admitting: Family Medicine

## 2019-08-01 MED ORDER — MEMANTINE HCL 10 MG PO TABS: 10.0000 mg | ORAL_TABLET | Freq: Two times a day (BID) | ORAL | 3 refills | Status: DC

## 2019-08-01 NOTE — Telephone Encounter (Signed)
Refills for Namenda are 10 mg po bid.   May refill for one year.

## 2019-08-01 NOTE — Telephone Encounter (Signed)
Pt was given memantine (NAMENDA TITRATION PAK) tablet pack the pharmacists   is calling to see  What follow up medication is need for the pt after he finishes the Memantine  Please call [facility]  [place]

## 2019-08-01 NOTE — Telephone Encounter (Signed)
Namenda 10mg  bid [facility] [place]

## 2019-08-01 NOTE — Telephone Encounter (Signed)
Please advise 

## 2019-08-03 ENCOUNTER — Encounter: Payer: Self-pay | Admitting: Physical Therapy

## 2019-08-03 ENCOUNTER — Other Ambulatory Visit: Payer: Self-pay

## 2019-08-03 ENCOUNTER — Ambulatory Visit: Payer: Medicare HMO | Admitting: Physical Therapy

## 2019-08-03 DIAGNOSIS — R296 Repeated falls: Secondary | ICD-10-CM | POA: Diagnosis not present

## 2019-08-03 DIAGNOSIS — R262 Difficulty in walking, not elsewhere classified: Secondary | ICD-10-CM | POA: Diagnosis not present

## 2019-08-03 DIAGNOSIS — R2681 Unsteadiness on feet: Secondary | ICD-10-CM | POA: Diagnosis not present

## 2019-08-03 NOTE — Therapy (Signed)
[facility] [place], [place], [address] Phone: [phone]   Fax:  [phone]  Physical Therapy Treatment  Patient Details  Name: [name] MRN: [id number] Date of Birth: [birth year] Referring Provider (PT): Carolann Littler, MD   Encounter Date: 08/03/2019   PT End of Session - 08/03/19 1352    Visit Number 11    Number of Visits 20    Date for PT Re-Evaluation 09/08/19    Authorization Type [employer]; Progress note every 10th visit; KX modifier at 15th visit    PT Start Time 1350    PT Stop Time 1430    PT Time Calculation (min) 40 min    Activity Tolerance Patient tolerated treatment well    Behavior During Therapy [facility] for tasks assessed/performed           Past Medical History:  Diagnosis Date  . CELLULITIS, RIGHT LEG 04/22/2009  . DM w/o complication type II 03/22/7865  . ECZEMA 05/08/2008  . HYPERLIPIDEMIA 05/08/2008  . HYPOTHYROIDISM 05/08/2008  . KNEE, ARTHRITIS, DEGEN./OSTEO 03/25/2009  . SUPERFICIAL PHLEBITIS 12/27/2008  . Unspecified essential hypertension 05/08/2008    Past Surgical History:  Procedure Laterality Date  . CATARACT EXTRACTION  2005   bilateral  . CHOLECYSTECTOMY    . COLONOSCOPY  04-15-2004   tics  . KNEE SURGERY     TKR Dr Alma Friendly 2007  . nasal polyps    . TONSILLECTOMY  1946  . WRIST SURGERY  2015   right side    There were no vitals filed for this visit.   Subjective Assessment - 08/03/19 1352    Subjective COVID-19 screening performed upon arrival. Patient reporting his main concern is his balance.    Pertinent History HTN, B TKA    Limitations Walking;House hold activities    Patient Stated Goals improve balance, improve confidence and improve movement    Currently in Pain? No/denies              [facility] PT Assessment - 08/03/19 0001      Assessment   Medical Diagnosis balance deficit    Referring Provider (PT) Carolann Littler, MD    Next MD Visit  10/02/2019    Prior Therapy yes      Precautions   Precautions Fall      Restrictions   Weight Bearing Restrictions No                         [facility] Adult PT Treatment/Exercise - 08/03/19 0001      Knee/Hip Exercises: Aerobic   Recumbent Bike L3, seat 10 x15 min      Knee/Hip Exercises: Standing   Hip Flexion Stengthening;Both;20 reps;Knee bent;Limitations    Hip Flexion Limitations yellow theraband    Other Standing Knee Exercises Resisted sidestep with yellow theraband x2 RT      Knee/Hip Exercises: Seated   Long Arc Quad Strengthening;Both;20 reps;Weights    Long Arc Quad Weight 4 lbs.    Clamshell with TheraBand Red   x20 reps   Sit to Sand 10 reps;without UE support   holding 4# ball              Balance Exercises - 08/03/19 0001      Balance Exercises: Standing   Standing Eyes Opened Narrow base of support (BOS);Foam/compliant surface   crossover midline reaching   SLS with Vectors Solid surface;Upper extremity assist 2   alternating crossover balance pod touching  Step Over Hurdles / Cones Lateral step over of 7"x9" bolster x10     Lift / Chop Right;Left;15 reps    Other Standing Exercises 10" step heel dot x15 reps each                  PT Long Term Goals - 07/31/19 1408      PT LONG TERM GOAL #4   Title Patient will improve Berg Balance Scale to 47/56 or greater to decrease risk of falls. (updated on 07/25/2019 from 41 to 47/56).    Baseline 41/56    Time 6    Period Weeks    Status On-going      PT LONG TERM GOAL #5   Title Pt will be able to navigate 1 light of stairs with single hand rail for assistance safely.    Time 6    Period Weeks    Status On-going                 Plan - 08/03/19 1443    Clinical Impression Statement Patient presented in clinic with primary request of improving his balance. Patient progressed to more advanced balance exercises. Patient VC'd to reduce UE support and only use fingertips and had  to be frequently reminded to use only fingertips. Patient required multiple attempts during sit/stands at low plinth height and without UE support.    Personal Factors and Comorbidities Comorbidity 1    Comorbidities HTN    Examination-Activity Limitations Stairs;Stand;Transfers;Locomotion Level    Stability/Clinical Decision Making Stable/Uncomplicated    Rehab Potential Good    PT Frequency 2x / week    PT Duration 6 weeks    PT Treatment/Interventions ADLs/Self Care Home Management;Moist Heat;Cryotherapy;Gait training;Stair training;Functional mobility training;Therapeutic activities;Therapeutic exercise;Balance training;Neuromuscular re-education;Manual techniques;Patient/family education;Passive range of motion    PT Next Visit Plan cont with LE strengthening and core stabilization, balance in sitting and standing.    PT Home Exercise Plan see patient education section    Consulted and Agree with Plan of Care Patient           Patient will benefit from skilled therapeutic intervention in order to improve the following deficits and impairments:  Difficulty walking, Decreased activity tolerance, Decreased balance, Decreased strength, Abnormal gait, Postural dysfunction  Visit Diagnosis: Difficulty in walking, not elsewhere classified  Unsteadiness on feet  Repeated falls     Problem List Patient Active Problem List   Diagnosis Date Noted  . Frequent falls 01/24/2019  . Hyponatremia 01/10/2019  . Depression, recurrent ([place]) 12/27/2018  . CKD (chronic kidney disease) stage 3, GFR 30-59 ml/min 03/04/2018  . Urinary urgency 01/13/2017  . Cognitive decline 11/27/2016  . Vitamin B 12 deficiency 04/12/2015  . Erectile dysfunction 07/07/2010  . CELLULITIS, RIGHT LEG 04/22/2009  . KNEE, ARTHRITIS, DEGEN./OSTEO 03/25/2009  . SUPERFICIAL PHLEBITIS 12/27/2008  . Hypothyroidism 05/08/2008  . Type 2 diabetes mellitus, controlled ([place]) 05/08/2008  . Hyperlipidemia 05/08/2008  .  Essential hypertension 05/08/2008  . ECZEMA 05/08/2008    Standley Brooking, PTA 08/03/2019, 2:46 PM  [facility] [address] [place], [place], [address] Phone: [phone]   Fax:  [phone]  Name: [name] MRN: [id number] Date of Birth: Jul 17, 1935

## 2019-08-08 ENCOUNTER — Other Ambulatory Visit: Payer: Self-pay

## 2019-08-08 ENCOUNTER — Encounter: Payer: Self-pay | Admitting: Physical Therapy

## 2019-08-08 ENCOUNTER — Ambulatory Visit: Payer: Medicare HMO | Admitting: Physical Therapy

## 2019-08-08 DIAGNOSIS — R262 Difficulty in walking, not elsewhere classified: Secondary | ICD-10-CM

## 2019-08-08 DIAGNOSIS — R296 Repeated falls: Secondary | ICD-10-CM

## 2019-08-08 DIAGNOSIS — R2681 Unsteadiness on feet: Secondary | ICD-10-CM | POA: Diagnosis not present

## 2019-08-08 NOTE — Therapy (Signed)
[facility] [place], [place], [address] Phone: [phone]   Fax:  [phone]  Physical Therapy Treatment  Patient Details  Name: [name] MRN: [id number] Date of Birth: November 11, 1935 Referring Provider (PT): Carolann Littler, MD   Encounter Date: 08/08/2019   PT End of Session - 08/08/19 1438    Visit Number 12    Number of Visits 20    Date for PT Re-Evaluation 09/08/19    Authorization Type [employer]; Progress note every 10th visit; KX modifier at 15th visit    Authorization Time Period Re-cert sent on 05/25/3242 for 8 additional visits    Authorization - Visit Number 12    Authorization - Number of Visits 12    PT Start Time 1436    Activity Tolerance Patient tolerated treatment well    Behavior During Therapy [facility] for tasks assessed/performed           Past Medical History:  Diagnosis Date  . CELLULITIS, RIGHT LEG 04/22/2009  . DM w/o complication type II 0/11/2723  . ECZEMA 05/08/2008  . HYPERLIPIDEMIA 05/08/2008  . HYPOTHYROIDISM 05/08/2008  . KNEE, ARTHRITIS, DEGEN./OSTEO 03/25/2009  . SUPERFICIAL PHLEBITIS 12/27/2008  . Unspecified essential hypertension 05/08/2008    Past Surgical History:  Procedure Laterality Date  . CATARACT EXTRACTION  2005   bilateral  . CHOLECYSTECTOMY    . COLONOSCOPY  04-15-2004   tics  . KNEE SURGERY     TKR Dr Alma Friendly 2007  . nasal polyps    . TONSILLECTOMY  1946  . WRIST SURGERY  2015   right side    There were no vitals filed for this visit.   Subjective Assessment - 08/08/19 1437    Subjective COVID-19 screening performed upon arrival. Patient reports feeling good with no reports of any falls    Pertinent History HTN, B TKA    Limitations Walking;House hold activities    Patient Stated Goals improve balance, improve confidence and improve movement    Currently in Pain? No/denies              [facility] PT Assessment - 08/08/19 0001      Assessment    Medical Diagnosis balance deficit    Referring Provider (PT) Carolann Littler, MD    Next MD Visit 10/02/2019    Prior Therapy yes      Precautions   Precautions Fall      Restrictions   Weight Bearing Restrictions No                         [facility] Adult PT Treatment/Exercise - 08/08/19 0001      Knee/Hip Exercises: Aerobic   Recumbent Bike L4, seat 10 x15 min      Knee/Hip Exercises: Standing   Other Standing Knee Exercises lateral stepping x3 minutes yellow theraband      Knee/Hip Exercises: Seated   Long Arc Quad --    Long Arc Quad [employer] --    Clamshell with TheraBand --    Sit to [employer] 10 reps;without UE support               Balance Exercises - 08/08/19 0001      Balance Exercises: Standing   Standing Eyes Opened Narrow base of support (BOS);Foam/compliant surface;2 reps;Time   2x1 mins    SLS with Vectors Solid surface;Upper extremity assist 1;4 reps   touching colored pods   Turning 5 reps    Step  Over Hurdles / Cones Lateral step over colored pods x10    Marching Limitations;Foam/compliant surface;10 reps;20 reps   to 6" step   Heel Raises Both;20 reps                  PT Long Term Goals - 07/31/19 1408      PT LONG TERM GOAL #4   Title Patient will improve Berg Balance Scale to 47/56 or greater to decrease risk of falls. (updated on 07/25/2019 from 41 to 47/56).    Baseline 41/56    Time 6    Period Weeks    Status On-going      PT LONG TERM GOAL #5   Title Pt will be able to navigate 1 light of stairs with single hand rail for assistance safely.    Time 6    Period Weeks    Status On-going                 Plan - 08/08/19 1524    Clinical Impression Statement Patient responded well to therapy session though required intermittent contact guard assist to prevent LOB. Patient's session focused on balance activities and progressed to activities with intermittent UE support. Patient able to perform sit to stands with support  on thighs only but required constant cues to stand straight before sitting down.    Personal Factors and Comorbidities Comorbidity 1    Comorbidities HTN    Examination-Activity Limitations Stairs;Stand;Transfers;Locomotion Level    Stability/Clinical Decision Making Stable/Uncomplicated    Clinical Decision Making Low    Rehab Potential Good    PT Frequency 2x / week    PT Duration 6 weeks    PT Treatment/Interventions ADLs/Self Care Home Management;Moist Heat;Cryotherapy;Gait training;Stair training;Functional mobility training;Therapeutic activities;Therapeutic exercise;Balance training;Neuromuscular re-education;Manual techniques;Patient/family education;Passive range of motion    PT Next Visit Plan cont with LE strengthening and core stabilization, balance in sitting and standing.    PT Home Exercise Plan see patient education section    Consulted and Agree with Plan of Care Patient           Patient will benefit from skilled therapeutic intervention in order to improve the following deficits and impairments:  Difficulty walking, Decreased activity tolerance, Decreased balance, Decreased strength, Abnormal gait, Postural dysfunction  Visit Diagnosis: Difficulty in walking, not elsewhere classified  Unsteadiness on feet  Repeated falls     Problem List Patient Active Problem List   Diagnosis Date Noted  . Frequent falls 01/24/2019  . Hyponatremia 01/10/2019  . Depression, recurrent ([place]) 12/27/2018  . CKD (chronic kidney disease) stage 3, GFR 30-59 ml/min 03/04/2018  . Urinary urgency 01/13/2017  . Cognitive decline 11/27/2016  . Vitamin B 12 deficiency 04/12/2015  . Erectile dysfunction 07/07/2010  . CELLULITIS, RIGHT LEG 04/22/2009  . KNEE, ARTHRITIS, DEGEN./OSTEO 03/25/2009  . SUPERFICIAL PHLEBITIS 12/27/2008  . Hypothyroidism 05/08/2008  . Type 2 diabetes mellitus, controlled ([place]) 05/08/2008  . Hyperlipidemia 05/08/2008  . Essential hypertension 05/08/2008  .  ECZEMA 05/08/2008    Gabriela Eves, PT, DPT 08/08/2019, 6:20 PM  [facility] [address] [place], [place], [address] Phone: [phone]   Fax:  [phone]  Name: CIRO TASHIRO MRN: [id number] Date of Birth: Mar 21, 1935

## 2019-08-10 ENCOUNTER — Other Ambulatory Visit: Payer: Self-pay | Admitting: Family Medicine

## 2019-08-10 ENCOUNTER — Other Ambulatory Visit: Payer: Self-pay

## 2019-08-10 ENCOUNTER — Ambulatory Visit: Payer: Medicare HMO | Admitting: Physical Therapy

## 2019-08-10 DIAGNOSIS — R262 Difficulty in walking, not elsewhere classified: Secondary | ICD-10-CM

## 2019-08-10 DIAGNOSIS — R296 Repeated falls: Secondary | ICD-10-CM

## 2019-08-10 DIAGNOSIS — R2681 Unsteadiness on feet: Secondary | ICD-10-CM | POA: Diagnosis not present

## 2019-08-10 NOTE — Therapy (Signed)
[facility] [place], [place], [address] Phone: [phone]   Fax:  [phone]  Physical Therapy Treatment  Patient Details  Name: [name] MRN: [id number] Date of Birth: Sep 29, 1935 Referring Provider (PT): Carolann Littler, MD   Encounter Date: 08/10/2019   PT End of Session - 08/10/19 1529    Visit Number 13    Number of Visits 20    Date for PT Re-Evaluation 09/08/19    Authorization Type [employer]; Progress note every 10th visit; KX modifier at 15th visit    Authorization Time Period Re-cert sent on 04/02/4130 for 8 additional visits    PT Start Time 0318    PT Stop Time 0358    PT Time Calculation (min) 40 min    Activity Tolerance Patient tolerated treatment well    Behavior During Therapy [facility] for tasks assessed/performed           Past Medical History:  Diagnosis Date  . CELLULITIS, RIGHT LEG 04/22/2009  . DM w/o complication type II 4/40/1027  . ECZEMA 05/08/2008  . HYPERLIPIDEMIA 05/08/2008  . HYPOTHYROIDISM 05/08/2008  . KNEE, ARTHRITIS, DEGEN./OSTEO 03/25/2009  . SUPERFICIAL PHLEBITIS 12/27/2008  . Unspecified essential hypertension 05/08/2008    Past Surgical History:  Procedure Laterality Date  . CATARACT EXTRACTION  2005   bilateral  . CHOLECYSTECTOMY    . COLONOSCOPY  04-15-2004   tics  . KNEE SURGERY     TKR Dr Alma Friendly 2007  . nasal polyps    . TONSILLECTOMY  1946  . WRIST SURGERY  2015   right side    There were no vitals filed for this visit.   Subjective Assessment - 08/10/19 1528    Subjective COVID-19 screening performed upon arrival. Patient arrived with no reports of any falls    Pertinent History HTN, B TKA    Limitations Walking;House hold activities    Patient Stated Goals improve balance, improve confidence and improve movement    Currently in Pain? No/denies              [facility] PT Assessment - 08/10/19 0001      Berg Balance Test   Sit to Stand Able to stand  without using hands and stabilize independently    Standing Unsupported Able to stand safely 2 minutes    Sitting with Back Unsupported but Feet Supported on Floor or Stool Able to sit safely and securely 2 minutes    Stand to Sit Sits safely with minimal use of hands    Transfers Able to transfer safely, minor use of hands    Standing Unsupported with Eyes Closed Able to stand 10 seconds safely    Standing Unsupported with Feet Together Able to place feet together independently and stand 1 minute safely    From Standing, Reach Forward with Outstretched Arm Can reach confidently >25 cm (10")    From Standing Position, Pick up Object from Floor Able to pick up shoe, needs supervision    From Standing Position, Turn to Look Behind Over each Shoulder Looks behind one side only/other side shows less weight shift    Turn 360 Degrees Able to turn 360 degrees safely one side only in 4 seconds or less    Standing Unsupported, Alternately Place Feet on Step/Stool Able to complete 4 steps without aid or supervision    Standing Unsupported, One Foot in Front Able to take small step independently and hold 30 seconds    Standing on One Leg  Unable to try or needs assist to prevent fall    Total Score 45                         [facility] Adult PT Treatment/Exercise - 08/10/19 0001      Knee/Hip Exercises: Aerobic   Recumbent Bike L4, seat 10 x15 min      Knee/Hip Exercises: Machines for Strengthening   Cybex Knee Extension 10# x 61mn    Cybex Knee Flexion 30# x338m               Balance Exercises - 08/10/19 0001      Balance Exercises: Standing   Standing Eyes Opened Narrow base of support (BOS);Wide (BOA);Solid surface;Time    Tandem Stance Eyes open;Intermittent upper extremity support;4 reps    Heel Raises Both;20 reps    Toe Raise Both;20 reps    Sit to Stand Standard surface;Time   x10 (able to perform 5 in 18 sec)                 PT Long Term Goals - 08/10/19  1530      PT LONG TERM GOAL #1   Title Patient will be independent with HEP    Time 6    Period Weeks    Status Achieved      PT LONG TERM GOAL #2   Title Patient will demonstrate 4+/5 bilateral LE MMT to improve stability during functional tasks.    Time 6    Period Weeks    Status On-going      PT LONG TERM GOAL #3   Title Patient will perform modified 5x sit to stand test in 18 seconds or less to improve balance and decrease risk of falls.    Time 6    Period Weeks    Status Achieved      PT LONG TERM GOAL #4   Title Patient will improve Berg Balance Scale to 47/56 or greater to decrease risk of falls. (updated on 07/25/2019 from 41 to 47/56).    Baseline 45/56 BERG 08/10/19    Time 6    Period Weeks    Status On-going      PT LONG TERM GOAL #5   Title Pt will be able to navigate 1 light of stairs with single hand rail for assistance safely.    Time 6    Period Weeks    Status On-going                 Plan - 08/10/19 1555    Clinical Impression Statement Patient tolerated treatment well today. Patient has reported no falls and able to perform ADL's with greater ease. Patient has improved with BERG score to 45/56 and improved with balance exercises with no LOB yet did need uni UE support with some activity. Patient met LTG#3 today with others progressing.    Personal Factors and Comorbidities Comorbidity 1    Comorbidities HTN    Examination-Activity Limitations Stairs;Stand;Transfers;Locomotion Level    Stability/Clinical Decision Making Stable/Uncomplicated    Rehab Potential Good    PT Frequency 2x / week    PT Duration 6 weeks    PT Treatment/Interventions ADLs/Self Care Home Management;Moist Heat;Cryotherapy;Gait training;Stair training;Functional mobility training;Therapeutic activities;Therapeutic exercise;Balance training;Neuromuscular re-education;Manual techniques;Patient/family education;Passive range of motion    PT Next Visit Plan cont with LE  strengthening and core stabilization, balance in sitting and standing.    Consulted and Agree with Plan of Care  Patient           Patient will benefit from skilled therapeutic intervention in order to improve the following deficits and impairments:  Difficulty walking, Decreased activity tolerance, Decreased balance, Decreased strength, Abnormal gait, Postural dysfunction  Visit Diagnosis: Unsteadiness on feet  Difficulty in walking, not elsewhere classified  Repeated falls     Problem List Patient Active Problem List   Diagnosis Date Noted  . Frequent falls 01/24/2019  . Hyponatremia 01/10/2019  . Depression, recurrent ([place]) 12/27/2018  . CKD (chronic kidney disease) stage 3, GFR 30-59 ml/min 03/04/2018  . Urinary urgency 01/13/2017  . Cognitive decline 11/27/2016  . Vitamin B 12 deficiency 04/12/2015  . Erectile dysfunction 07/07/2010  . CELLULITIS, RIGHT LEG 04/22/2009  . KNEE, ARTHRITIS, DEGEN./OSTEO 03/25/2009  . SUPERFICIAL PHLEBITIS 12/27/2008  . Hypothyroidism 05/08/2008  . Type 2 diabetes mellitus, controlled ([place]) 05/08/2008  . Hyperlipidemia 05/08/2008  . Essential hypertension 05/08/2008  . ECZEMA 05/08/2008    ,  P, PTA 08/10/2019, 4:00 PM  [facility] [address] [place], [place], [address] Phone: [phone]   Fax:  [phone]  Name: [name] MRN: [id number] Date of Birth: 10-20-1935

## 2019-08-16 ENCOUNTER — Telehealth: Payer: Self-pay | Admitting: Family Medicine

## 2019-08-16 MED ORDER — MEMANTINE HCL 10 MG PO TABS: 10.0000 mg | ORAL_TABLET | Freq: Two times a day (BID) | ORAL | 3 refills | Status: AC

## 2019-08-16 MED ORDER — ESCITALOPRAM OXALATE 10 MG PO TABS: 10.0000 mg | ORAL_TABLET | Freq: Every day | ORAL | 5 refills | Status: DC

## 2019-08-16 NOTE — Telephone Encounter (Signed)
Medication sent in to [facility]

## 2019-08-16 NOTE — Telephone Encounter (Signed)
[employer] stated the pt was given price differences and wants to switch through them and he is requesting these medications   Medication Refill:  Escitalopram Memantine  Pharmacy: [place]: [phone]

## 2019-08-28 ENCOUNTER — Other Ambulatory Visit: Payer: Self-pay | Admitting: Family Medicine

## 2019-08-29 ENCOUNTER — Ambulatory Visit: Payer: Medicare HMO | Attending: Family Medicine | Admitting: Physical Therapy

## 2019-08-29 ENCOUNTER — Encounter: Payer: Self-pay | Admitting: Physical Therapy

## 2019-08-29 ENCOUNTER — Other Ambulatory Visit: Payer: Self-pay

## 2019-08-29 DIAGNOSIS — R2681 Unsteadiness on feet: Secondary | ICD-10-CM

## 2019-08-29 DIAGNOSIS — R296 Repeated falls: Secondary | ICD-10-CM

## 2019-08-29 DIAGNOSIS — R262 Difficulty in walking, not elsewhere classified: Secondary | ICD-10-CM | POA: Diagnosis not present

## 2019-08-29 NOTE — Therapy (Signed)
[facility] [place], [place], [address] Phone: [phone]   Fax:  [phone]  Physical Therapy Treatment  Patient Details  Name: [name] MRN: [id number] Date of Birth: [birth year] Referring Provider (PT): Carolann Littler, MD   Encounter Date: 08/29/2019   PT End of Session - 08/29/19 1407    Visit Number 14    Number of Visits 20    Date for PT Re-Evaluation 09/08/19    Authorization Type [employer]; Progress note every 10th visit; KX modifier at 15th visit    Authorization Time Period Re-cert sent on 06/29/4330 for 8 additional visits    PT Start Time 1353    PT Stop Time 1428   limted by late arrival and fatigue   PT Time Calculation (min) 35 min    Activity Tolerance Patient tolerated treatment well;Patient limited by fatigue    Behavior During Therapy [facility] for tasks assessed/performed           Past Medical History:  Diagnosis Date  . CELLULITIS, RIGHT LEG 04/22/2009  . DM w/o complication type II 9/51/8841  . ECZEMA 05/08/2008  . HYPERLIPIDEMIA 05/08/2008  . HYPOTHYROIDISM 05/08/2008  . KNEE, ARTHRITIS, DEGEN./OSTEO 03/25/2009  . SUPERFICIAL PHLEBITIS 12/27/2008  . Unspecified essential hypertension 05/08/2008    Past Surgical History:  Procedure Laterality Date  . CATARACT EXTRACTION  2005   bilateral  . CHOLECYSTECTOMY    . COLONOSCOPY  04-15-2004   tics  . KNEE SURGERY     TKR Dr Alma Friendly 2007  . nasal polyps    . TONSILLECTOMY  1946  . WRIST SURGERY  2015   right side    There were no vitals filed for this visit.   Subjective Assessment - 08/29/19 1406    Subjective COVID-19 screening performed upon arrival. Patient reports he has been doing okay but doesn't have any balance. Denies any LOB or falls while on vacation for two weeks.    Pertinent History HTN, B TKA    Limitations Walking;House hold activities    Patient Stated Goals improve balance, improve confidence and improve movement      Currently in Pain? No/denies              [facility] PT Assessment - 08/29/19 0001      Assessment   Medical Diagnosis balance deficit    Referring Provider (PT) Carolann Littler, MD    Next MD Visit 10/02/2019                         [facility] Adult PT Treatment/Exercise - 08/29/19 0001      Ambulation/Gait   Stairs Yes    Stairs Assistance 6: Modified independent (Device/Increase time)    Stair Management Technique One rail Right;Alternating pattern;Forwards    Number of Stairs 4   x 1 RT   Height of Stairs 6.5      Knee/Hip Exercises: Aerobic   Recumbent Bike L4 x15 min      Knee/Hip Exercises: Machines for Strengthening   Cybex Knee Extension 20# 3x10 reps    Cybex Knee Flexion 40# 3x10 reps               Balance Exercises - 08/29/19 0001      Balance Exercises: Standing   Standing Eyes Opened Wide (BOA);Foam/compliant surface;Head turns    Step Ups Forward;6 inch;UE support 1;Other (comment)   x15 reps each   Sidestepping 3 reps    Turning  Both;5 reps    Heel Raises Both;20 reps    Toe Raise Both;20 reps    Sit to Stand Standard surface;Without upper extremity support                  PT Long Term Goals - 08/10/19 1530      PT LONG TERM GOAL #1   Title Patient will be independent with HEP    Time 6    Period Weeks    Status Achieved      PT LONG TERM GOAL #2   Title Patient will demonstrate 4+/5 bilateral LE MMT to improve stability during functional tasks.    Time 6    Period Weeks    Status On-going      PT LONG TERM GOAL #3   Title Patient will perform modified 5x sit to stand test in 18 seconds or less to improve balance and decrease risk of falls.    Time 6    Period Weeks    Status Achieved      PT LONG TERM GOAL #4   Title Patient will improve Berg Balance Scale to 47/56 or greater to decrease risk of falls. (updated on 07/25/2019 from 41 to 47/56).    Baseline 45/56 BERG 08/10/19    Time 6    Period Weeks    Status  On-going      PT LONG TERM GOAL #5   Title Pt will be able to navigate 1 light of stairs with single hand rail for assistance safely.    Time 6    Period Weeks    Status On-going                 Plan - 08/29/19 1442    Clinical Impression Statement Patient presented in clinic with reports of continued balance deficits but could not recall if he had an LOB or falls while on vacation. Patient guided through strengthening and functional assessment with forward step ups. Patient able to tolerate reciprical gait on stairs with only one UE support. Patient required multiple attempts on sit<> stands due to fatigue by end of treatment.    Personal Factors and Comorbidities Comorbidity 1    Comorbidities HTN    Examination-Activity Limitations Stairs;Stand;Transfers;Locomotion Level    Stability/Clinical Decision Making Stable/Uncomplicated    Rehab Potential Good    PT Frequency 2x / week    PT Duration 6 weeks    PT Treatment/Interventions ADLs/Self Care Home Management;Moist Heat;Cryotherapy;Gait training;Stair training;Functional mobility training;Therapeutic activities;Therapeutic exercise;Balance training;Neuromuscular re-education;Manual techniques;Patient/family education;Passive range of motion    PT Next Visit Plan cont with LE strengthening and core stabilization, balance in sitting and standing.    PT Home Exercise Plan see patient education section    Consulted and Agree with Plan of Care Patient           Patient will benefit from skilled therapeutic intervention in order to improve the following deficits and impairments:  Difficulty walking, Decreased activity tolerance, Decreased balance, Decreased strength, Abnormal gait, Postural dysfunction  Visit Diagnosis: Unsteadiness on feet  Difficulty in walking, not elsewhere classified  Repeated falls     Problem List Patient Active Problem List   Diagnosis Date Noted  . Frequent falls 01/24/2019  . Hyponatremia  01/10/2019  . Depression, recurrent ([place]) 12/27/2018  . CKD (chronic kidney disease) stage 3, GFR 30-59 ml/min 03/04/2018  . Urinary urgency 01/13/2017  . Cognitive decline 11/27/2016  . Vitamin B 12 deficiency 04/12/2015  . Erectile dysfunction  07/07/2010  . CELLULITIS, RIGHT LEG 04/22/2009  . KNEE, ARTHRITIS, DEGEN./OSTEO 03/25/2009  . SUPERFICIAL PHLEBITIS 12/27/2008  . Hypothyroidism 05/08/2008  . Type 2 diabetes mellitus, controlled ([place]) 05/08/2008  . Hyperlipidemia 05/08/2008  . Essential hypertension 05/08/2008  . ECZEMA 05/08/2008    Standley Brooking, PTA 08/29/2019, 2:48 PM  [facility] [address] [place], [place], [address] Phone: [phone]   Fax:  [phone]  Name: [name] MRN: [id number] Date of Birth: 1935-09-14

## 2019-08-31 ENCOUNTER — Encounter: Payer: Medicare HMO | Admitting: Physical Therapy

## 2019-09-05 ENCOUNTER — Other Ambulatory Visit: Payer: Self-pay

## 2019-09-05 ENCOUNTER — Ambulatory Visit: Payer: Medicare HMO | Admitting: *Deleted

## 2019-09-05 DIAGNOSIS — R296 Repeated falls: Secondary | ICD-10-CM | POA: Diagnosis not present

## 2019-09-05 DIAGNOSIS — R262 Difficulty in walking, not elsewhere classified: Secondary | ICD-10-CM | POA: Diagnosis not present

## 2019-09-05 DIAGNOSIS — R2681 Unsteadiness on feet: Secondary | ICD-10-CM

## 2019-09-05 NOTE — Therapy (Signed)
[facility] [place], [place], [address] Phone: [phone]   Fax:  [phone]  Physical Therapy Treatment  Patient Details  Name: [name] MRN: [id number] Date of Birth: Jun 06, 1935 Referring Provider (PT): Carolann Littler, MD   Encounter Date: 09/05/2019   PT End of Session - 09/05/19 1449    Visit Number 15    Number of Visits 20    Date for PT Re-Evaluation 09/08/19    Authorization Type [employer]; Progress note every 10th visit; KX modifier at 15th visit    Authorization Time Period Re-cert sent on 05/02/7671 for 8 additional visits    Authorization - Visit Number 13    Authorization - Number of Visits 13    PT Start Time 1433    PT Stop Time 1521    PT Time Calculation (min) 48 min           Past Medical History:  Diagnosis Date  . CELLULITIS, RIGHT LEG 04/22/2009  . DM w/o complication type II 06/11/3788  . ECZEMA 05/08/2008  . HYPERLIPIDEMIA 05/08/2008  . HYPOTHYROIDISM 05/08/2008  . KNEE, ARTHRITIS, DEGEN./OSTEO 03/25/2009  . SUPERFICIAL PHLEBITIS 12/27/2008  . Unspecified essential hypertension 05/08/2008    Past Surgical History:  Procedure Laterality Date  . CATARACT EXTRACTION  2005   bilateral  . CHOLECYSTECTOMY    . COLONOSCOPY  04-15-2004   tics  . KNEE SURGERY     TKR Dr Alma Friendly 2007  . nasal polyps    . TONSILLECTOMY  1946  . WRIST SURGERY  2015   right side    There were no vitals filed for this visit.   Subjective Assessment - 09/05/19 1448    Subjective COVID-19 screening performed upon arrival. Patient reports no  LOB or falls while on vacation for two weeks. Doing ok today    Pertinent History HTN, B TKA    Patient Stated Goals improve balance, improve confidence and improve movement                             [facility] Adult PT Treatment/Exercise - 09/05/19 0001      Knee/Hip Exercises: Aerobic   Recumbent Bike L5 x15 min               Balance  Exercises - 09/05/19 0001      Balance Exercises: Standing   Standing Eyes Opened Wide (BOA);Foam/compliant surface;Head turns    Tandem Stance Eyes open;Intermittent upper extremity support;4 reps    Standing, One Foot on a Step Eyes open;6 inch;5 reps;10 secs    Rockerboard Anterior/posterior   3 mins total SBA / CGA   Step Ups Forward;6 inch;UE support 1;Other (comment)   x20 reps each   Sidestepping 3 reps    Heel Raises Both;20 reps    Toe Raise Both;20 reps    Sit to Stand Without upper extremity support;Elevated surface                  PT Long Term Goals - 08/10/19 1530      PT LONG TERM GOAL #1   Title Patient will be independent with HEP    Time 6    Period Weeks    Status Achieved      PT LONG TERM GOAL #2   Title Patient will demonstrate 4+/5 bilateral LE MMT to improve stability during functional tasks.    Time 6    Period Weeks  Status On-going      PT LONG TERM GOAL #3   Title Patient will perform modified 5x sit to stand test in 18 seconds or less to improve balance and decrease risk of falls.    Time 6    Period Weeks    Status Achieved      PT LONG TERM GOAL #4   Title Patient will improve Berg Balance Scale to 47/56 or greater to decrease risk of falls. (updated on 07/25/2019 from 41 to 47/56).    Baseline 45/56 BERG 08/10/19    Time 6    Period Weeks    Status On-going      PT LONG TERM GOAL #5   Title Pt will be able to navigate 1 light of stairs with single hand rail for assistance safely.    Time 6    Period Weeks    Status On-going                 Plan - 09/05/19 1523    Clinical Impression Statement Pt arrived today doing fairly well. Rx focused on balance Act's and some LE strengthening exs. He has progressed, but needs v/c's for technique and posture at times. Review HEP next visit and DC.    Personal Factors and Comorbidities Comorbidity 1    Comorbidities HTN    Stability/Clinical Decision Making Stable/Uncomplicated     Rehab Potential Good    PT Frequency 2x / week    PT Duration 6 weeks    PT Treatment/Interventions ADLs/Self Care Home Management;Moist Heat;Cryotherapy;Gait training;Stair training;Functional mobility training;Therapeutic activities;Therapeutic exercise;Balance training;Neuromuscular re-education;Manual techniques;Patient/family education;Passive range of motion    PT Next Visit Plan cont with LE strengthening and core stabilization, balance in sitting and standing.  Assess LTGs and DC after next Rx           Patient will benefit from skilled therapeutic intervention in order to improve the following deficits and impairments:  Difficulty walking, Decreased activity tolerance, Decreased balance, Decreased strength, Abnormal gait, Postural dysfunction  Visit Diagnosis: Unsteadiness on feet  Difficulty in walking, not elsewhere classified  Repeated falls     Problem List Patient Active Problem List   Diagnosis Date Noted  . Frequent falls 01/24/2019  . Hyponatremia 01/10/2019  . Depression, recurrent ([place]) 12/27/2018  . CKD (chronic kidney disease) stage 3, GFR 30-59 ml/min 03/04/2018  . Urinary urgency 01/13/2017  . Cognitive decline 11/27/2016  . Vitamin B 12 deficiency 04/12/2015  . Erectile dysfunction 07/07/2010  . CELLULITIS, RIGHT LEG 04/22/2009  . KNEE, ARTHRITIS, DEGEN./OSTEO 03/25/2009  . SUPERFICIAL PHLEBITIS 12/27/2008  . Hypothyroidism 05/08/2008  . Type 2 diabetes mellitus, controlled ([place]) 05/08/2008  . Hyperlipidemia 05/08/2008  . Essential hypertension 05/08/2008  . ECZEMA 05/08/2008    Jaquetta Currier,CHRIS, PTA 09/05/2019, 3:27 PM  [facility] [address] [place], [place], [address] Phone: [phone]   Fax:  [phone]  Name: [name] MRN: [id number] Date of Birth: September 09, 1935

## 2019-09-07 ENCOUNTER — Ambulatory Visit: Payer: Medicare HMO | Admitting: *Deleted

## 2019-09-07 ENCOUNTER — Other Ambulatory Visit: Payer: Self-pay

## 2019-09-07 DIAGNOSIS — R2681 Unsteadiness on feet: Secondary | ICD-10-CM

## 2019-09-07 DIAGNOSIS — R296 Repeated falls: Secondary | ICD-10-CM

## 2019-09-07 DIAGNOSIS — R262 Difficulty in walking, not elsewhere classified: Secondary | ICD-10-CM

## 2019-09-07 NOTE — Therapy (Signed)
[facility] [place], [place], [address] Phone: [phone]   Fax:  [phone]  Physical Therapy Treatment PHYSICAL THERAPY DISCHARGE SUMMARY  Visits from Start of Care: 16  Current functional level related to goals / functional outcomes: See below   Remaining deficits: See goals   Education / Equipment: HEP   Plan: Patient agrees to discharge.  Patient goals were partially met. Patient is being discharged due to being pleased with the current functional level.  ?????    Gabriela Eves, PT, DPT  Patient Details  Name: MARKY BURESH MRN: [id number] Date of Birth: 1935/07/23 Referring Provider (PT): Carolann Littler, MD   Encounter Date: 09/07/2019   PT End of Session - 09/07/19 1450    Visit Number 16    Number of Visits 20    Date for PT Re-Evaluation 09/08/19    Authorization Type [employer]; Progress note every 10th visit; KX modifier at 15th visit    Authorization Time Period Re-cert sent on 08/29/7207 for 8 additional visits    Authorization - Visit Number 16    Authorization - Number of Visits 16    PT Start Time 1430    PT Stop Time 1520    PT Time Calculation (min) 50 min           Past Medical History:  Diagnosis Date  . CELLULITIS, RIGHT LEG 04/22/2009  . DM w/o complication type II 4/70/9628  . ECZEMA 05/08/2008  . HYPERLIPIDEMIA 05/08/2008  . HYPOTHYROIDISM 05/08/2008  . KNEE, ARTHRITIS, DEGEN./OSTEO 03/25/2009  . SUPERFICIAL PHLEBITIS 12/27/2008  . Unspecified essential hypertension 05/08/2008    Past Surgical History:  Procedure Laterality Date  . CATARACT EXTRACTION  2005   bilateral  . CHOLECYSTECTOMY    . COLONOSCOPY  04-15-2004   tics  . KNEE SURGERY     TKR Dr Alma Friendly 2007  . nasal polyps    . TONSILLECTOMY  1946  . WRIST SURGERY  2015   right side    There were no vitals filed for this visit.   Subjective Assessment - 09/07/19 1450    Subjective COVID-19 screening  performed upon arrival. Patient reports no  LOB or falls while on vacation for two weeks. Did ok after last Rx    Pertinent History HTN, B TKA    Limitations Walking;House hold activities    Patient Stated Goals improve balance, improve confidence and improve movement              [facility] PT Assessment - 09/07/19 0001      Berg Balance Test   Sit to Stand Able to stand without using hands and stabilize independently    Standing Unsupported Able to stand safely 2 minutes    Sitting with Back Unsupported but Feet Supported on Floor or Stool Able to sit safely and securely 2 minutes    Stand to Sit Sits safely with minimal use of hands    Transfers Able to transfer safely, minor use of hands    Standing Unsupported with Eyes Closed Able to stand 10 seconds safely    Standing Unsupported with Feet Together Able to place feet together independently and stand 1 minute safely    From Standing, Reach Forward with Outstretched Arm Can reach confidently >25 cm (10")    From Standing Position, Pick up Object from Floor Able to pick up shoe, needs supervision    From Standing Position, Turn to Look Behind Over each Shoulder Looks behind one side  only/other side shows less weight shift    Turn 360 Degrees Able to turn 360 degrees safely one side only in 4 seconds or less    Standing Unsupported, Alternately Place Feet on Step/Stool Able to stand independently and safely and complete 8 steps in 20 seconds    Standing Unsupported, One Foot in Front Able to take small step independently and hold 30 seconds    Standing on One Leg Tries to lift leg/unable to hold 3 seconds but remains standing independently    Total Score 48                         [facility] Adult PT Treatment/Exercise - 09/07/19 0001      Knee/Hip Exercises: Aerobic   Recumbent Bike L5 x15 min      Knee/Hip Exercises: Standing   Other Standing Knee Exercises Standing  SLS with toe touch and PRN UE assist performed x 5  each side added to HEP.      Knee/Hip Exercises: Seated   Sit to Sand 10 reps;without UE support;with UE support;2 sets   Given for HEP                      PT Long Term Goals - 09/07/19 1451      PT LONG TERM GOAL #1   Title Patient will be independent with HEP    Time 6    Period Weeks    Status Achieved      PT LONG TERM GOAL #2   Title Patient will demonstrate 4+/5 bilateral LE MMT to improve stability during functional tasks.    Time 6    Period Weeks    Status Partially Met   NM    4/5 MMT     PT LONG TERM GOAL #3   Title Patient will perform modified 5x sit to stand test in 18 seconds or less to improve balance and decrease risk of falls.    Time 6    Period Weeks    Status Achieved      PT LONG TERM GOAL #4   Title Patient will improve Berg Balance Scale to 47/56 or greater to decrease risk of falls. (updated on 07/25/2019 from 41 to 47/56).    Baseline 48/56 BERG 09/07/19    Time 6    Period Weeks    Status Achieved      PT LONG TERM GOAL #5   Title Pt will be able to navigate 1 light of stairs with single hand rail for assistance safely.    Time 6    Period Weeks    Status Achieved                 Plan - 09/07/19 1451    Clinical Impression Statement Pt arrived today doing fairlywell. He was able to perform BERG again and score 48 and able to meet LTG. All LTGs were met except Bil MMT for LE's due to mild weakness 4/5. Pt will be DC to HEP at this time.    Personal Factors and Comorbidities Comorbidity 1    Rehab Potential Good    PT Frequency 2x / week    PT Duration 6 weeks    PT Treatment/Interventions ADLs/Self Care Home Management;Moist Heat;Cryotherapy;Gait training;Stair training;Functional mobility training;Therapeutic activities;Therapeutic exercise;Balance training;Neuromuscular re-education;Manual techniques;Patient/family education;Passive range of motion    PT Next Visit Plan cont with LE strengthening and core stabilization,  balance in sitting  and standing.  Assess LTGs and DC after next Rx           Patient will benefit from skilled therapeutic intervention in order to improve the following deficits and impairments:  Difficulty walking, Decreased activity tolerance, Decreased balance, Decreased strength, Abnormal gait, Postural dysfunction  Visit Diagnosis: Unsteadiness on feet  Difficulty in walking, not elsewhere classified  Repeated falls     Problem List Patient Active Problem List   Diagnosis Date Noted  . Frequent falls 01/24/2019  . Hyponatremia 01/10/2019  . Depression, recurrent ([place]) 12/27/2018  . CKD (chronic kidney disease) stage 3, GFR 30-59 ml/min 03/04/2018  . Urinary urgency 01/13/2017  . Cognitive decline 11/27/2016  . Vitamin B 12 deficiency 04/12/2015  . Erectile dysfunction 07/07/2010  . CELLULITIS, RIGHT LEG 04/22/2009  . KNEE, ARTHRITIS, DEGEN./OSTEO 03/25/2009  . SUPERFICIAL PHLEBITIS 12/27/2008  . Hypothyroidism 05/08/2008  . Type 2 diabetes mellitus, controlled ([place]) 05/08/2008  . Hyperlipidemia 05/08/2008  . Essential hypertension 05/08/2008  . ECZEMA 05/08/2008    Vonita Calloway,CHRIS, PTA 09/07/2019, 6:10 PM  [facility] [place], [place], [address] Phone: [phone]   Fax:  [phone]  Name: DAITON COWLES MRN: [id number] Date of Birth: [birth year]

## 2019-09-19 DIAGNOSIS — L84 Corns and callosities: Secondary | ICD-10-CM | POA: Diagnosis not present

## 2019-09-19 DIAGNOSIS — M79676 Pain in unspecified toe(s): Secondary | ICD-10-CM | POA: Diagnosis not present

## 2019-09-19 DIAGNOSIS — B351 Tinea unguium: Secondary | ICD-10-CM | POA: Diagnosis not present

## 2019-09-19 DIAGNOSIS — E1151 Type 2 diabetes mellitus with diabetic peripheral angiopathy without gangrene: Secondary | ICD-10-CM | POA: Diagnosis not present

## 2019-09-20 DIAGNOSIS — H26491 Other secondary cataract, right eye: Secondary | ICD-10-CM | POA: Diagnosis not present

## 2019-09-29 DIAGNOSIS — N3941 Urge incontinence: Secondary | ICD-10-CM | POA: Diagnosis not present

## 2019-10-02 ENCOUNTER — Other Ambulatory Visit: Payer: Self-pay

## 2019-10-02 ENCOUNTER — Encounter: Payer: Self-pay | Admitting: Family Medicine

## 2019-10-02 ENCOUNTER — Ambulatory Visit (INDEPENDENT_AMBULATORY_CARE_PROVIDER_SITE_OTHER): Payer: Medicare HMO | Admitting: Family Medicine

## 2019-10-02 VITALS — BP 124/62 | HR 54 | Temp 98.2°F | Wt 184.5 lb

## 2019-10-02 DIAGNOSIS — E785 Hyperlipidemia, unspecified: Secondary | ICD-10-CM

## 2019-10-02 DIAGNOSIS — E119 Type 2 diabetes mellitus without complications: Secondary | ICD-10-CM | POA: Diagnosis not present

## 2019-10-02 DIAGNOSIS — I10 Essential (primary) hypertension: Secondary | ICD-10-CM

## 2019-10-02 DIAGNOSIS — J3089 Other allergic rhinitis: Secondary | ICD-10-CM | POA: Diagnosis not present

## 2019-10-02 LAB — POCT GLYCOSYLATED HEMOGLOBIN (HGB A1C): Hemoglobin A1C: 6 % — AB (ref 4.0–5.6)

## 2019-10-02 NOTE — Progress Notes (Signed)
Established Patient Office Visit  Subjective:  Patient ID: [name], male    DOB: Aug 02, 1935  Age: 84 y.o. MRN: [id number]  CC:  Chief Complaint  Patient presents with  . Follow-up    pt is here for 3 month follow up     HPI [name] presents for medical follow-up.  Accompanied by wife.  His chronic problems include history of recurrent depression, type 2 diabetes, chronic kidney disease, hypothyroidism, hyperlipidemia, hypertension, and Alzheimer's dementia.  We recently added Namenda.  Wife is not sure she has seen much change.  He denies any major side effects from medication.  He has had increased risk of falls with some balance issues.  We referred to physical therapy and wife thinks this is helping.  He has had frequent rhinorrhea symptoms.  They have tried Claritin and Zyrtec without much relief.  They also tried Flonase.  No purulent secretions.  No bloody discharge.  No fevers or chills.  No pets.  Symptoms are somewhat perennial  Past Medical History:  Diagnosis Date  . CELLULITIS, RIGHT LEG 04/22/2009  . DM w/o complication type II 06/01/8117  . ECZEMA 05/08/2008  . HYPERLIPIDEMIA 05/08/2008  . HYPOTHYROIDISM 05/08/2008  . KNEE, ARTHRITIS, DEGEN./OSTEO 03/25/2009  . SUPERFICIAL PHLEBITIS 12/27/2008  . Unspecified essential hypertension 05/08/2008    Past Surgical History:  Procedure Laterality Date  . CATARACT EXTRACTION  2005   bilateral  . CHOLECYSTECTOMY    . COLONOSCOPY  04-15-2004   tics  . KNEE SURGERY     TKR Dr Alma Friendly 2007  . nasal polyps    . TONSILLECTOMY  1946  . WRIST SURGERY  2015   right side    Family History  Problem Relation Age of Onset  . Arthritis Other   . Hypertension Other   . Heart disease Other   . Colon cancer Neg Hx     Social History   Socioeconomic History  . Marital status: Married    Spouse name: Not on file  . Number of children: Not on file  . Years of education: Not on file  . Highest education level: Not  on file  Occupational History  . Not on file  Tobacco Use  . Smoking status: Never Smoker  . Smokeless tobacco: Never Used  Vaping Use  . Vaping Use: Never used  Substance and Sexual Activity  . Alcohol use: No    Alcohol/week: 0.0 standard drinks  . Drug use: No  . Sexual activity: Not on file  Other Topics Concern  . Not on file  Social History Narrative  . Not on file   Social Determinants of Health   Financial Resource Strain:   . Difficulty of Paying Living Expenses:   Food Insecurity:   . Worried About Charity fundraiser in the Last Year:   . Arboriculturist in the Last Year:   Transportation Needs:   . Film/video editor (Medical):   Marland Kitchen Lack of Transportation (Non-Medical):   Physical Activity:   . Days of Exercise per Week:   . Minutes of Exercise per Session:   Stress:   . Feeling of Stress :   Social Connections:   . Frequency of Communication with Friends and Family:   . Frequency of Social Gatherings with Friends and Family:   . Attends Religious Services:   . Active Member of Clubs or Organizations:   . Attends Archivist Meetings:   Marland Kitchen Marital Status:  Intimate Partner Violence:   . Fear of Current or Ex-Partner:   . Emotionally Abused:   Marland Kitchen Physically Abused:   . Sexually Abused:     Outpatient Medications Prior to Visit  Medication Sig Dispense Refill  . Accu-Chek FastClix Lancets MISC CHECK BLOOD SUGAR 2 TIMES DAILY OR AS NEEDED 204 each 1  . amLODipine (NORVASC) 5 MG tablet TAKE 1 TABLET EVERY DAY 90 tablet 3  . aspirin EC 325 MG tablet Take 325 mg by mouth daily.    . benazepril (LOTENSIN) 20 MG tablet TAKE 1 TABLET EVERY DAY 90 tablet 2  . Blood Glucose Calibration (ACCU-CHEK AVIVA) SOLN USE AS DIRECTED 1 each 1  . donepezil (ARICEPT) 10 MG tablet TAKE 1 TABLET AT BEDTIME 90 tablet 2  . escitalopram (LEXAPRO) 10 MG tablet Take 1 tablet (10 mg total) by mouth daily. 30 tablet 5  . glucose blood (ACCU-CHEK AVIVA PLUS) test strip  CHECK BLOOD SUGAR ONE TIME DAILY 100 strip 1  . levothyroxine (SYNTHROID) 112 MCG tablet TAKE 1 TABLET EVERY DAY 90 tablet 2  . memantine (NAMENDA) 10 MG tablet Take 1 tablet (10 mg total) by mouth 2 (two) times daily. 180 tablet 3  . mirabegron ER (MYRBETRIQ) 50 MG TB24 tablet Take 50 mg by mouth daily.    . Omega-3 Fatty Acids (FISH OIL) 1200 MG CAPS Take 1 capsule by mouth daily.    . Potassium 99 MG TABS Take 1 tablet by mouth daily.    . propranolol (INDERAL) 40 MG tablet TAKE 1 TABLET EVERY DAY 90 tablet 2  . simvastatin (ZOCOR) 40 MG tablet TAKE 1 TABLET AT BEDTIME 90 tablet 2  . memantine (NAMENDA TITRATION PAK) tablet pack 5 mg/day for =1 week; 5 mg twice daily for =1 week; 15 mg/day given in 5 mg and 10 mg separated doses for =1 week; then 10 mg twice daily 49 tablet 12   Facility-Administered Medications Prior to Visit  Medication Dose Route Frequency Provider Last Rate Last Admin  . cyanocobalamin ((VITAMIN B-12)) injection 1,000 mcg  1,000 mcg Intramuscular Q30 days [name], MD   1,000 mcg at 09/23/15 1206    No Known Allergies  ROS Review of Systems    Objective:    Physical Exam Vitals reviewed.  Constitutional:      Appearance: Normal appearance.  Cardiovascular:     Rate and Rhythm: Normal rate and regular rhythm.  Musculoskeletal:     Cervical back: Neck supple.  Neurological:     General: No focal deficit present.     Mental Status: He is alert.     Cranial Nerves: No cranial nerve deficit.  Psychiatric:     Comments: MMSE 28/30     BP 124/62 (BP Location: Left Arm, Patient Position: Sitting, Cuff Size: Normal)   Pulse (!) 54   Temp 98.2 F (36.8 C) (Oral)   Wt 184 lb 8 oz (83.7 kg)   SpO2 98%   BMI 25.73 kg/m  Wt Readings from Last 3 Encounters:  10/02/19 184 lb 8 oz (83.7 kg)  06/30/19 181 lb 4.8 oz (82.2 kg)  03/31/19 178 lb 8 oz (81 kg)     Health Maintenance Due  Topic Date Due  . COVID-19 Vaccine (1) Never done  .  TETANUS/TDAP  07/12/2019  . COLONOSCOPY  10/01/2019  . INFLUENZA VACCINE  09/24/2019    There are no preventive care reminders to display for this patient.  Lab Results  Component Value Date  TSH 3.48 12/27/2018   Lab Results  Component Value Date   WBC 11.7 (H) 12/27/2018   HGB 14.7 12/27/2018   HCT 44.4 12/27/2018   MCV 91.3 12/27/2018   PLT 317.0 12/27/2018   Lab Results  Component Value Date   NA 133 (L) 01/10/2019   K 3.5 01/10/2019   CO2 31 01/10/2019   GLUCOSE 150 (H) 01/10/2019   BUN 13 01/10/2019   CREATININE 1.08 01/10/2019   BILITOT 3.4 (H) 12/27/2018   ALKPHOS 51 12/27/2018   AST 23 12/27/2018   ALT 25 12/27/2018   PROT 7.4 12/27/2018   ALBUMIN 4.6 12/27/2018   CALCIUM 10.1 01/10/2019   GFR 65.53 01/10/2019   Lab Results  Component Value Date   CHOL 95 09/02/2018   Lab Results  Component Value Date   HDL 33.40 (L) 09/02/2018   Lab Results  Component Value Date   LDLCALC 44 09/02/2018   Lab Results  Component Value Date   TRIG 84.0 09/02/2018   Lab Results  Component Value Date   CHOLHDL 3 09/02/2018   Lab Results  Component Value Date   HGBA1C 6.0 (A) 10/02/2019      Assessment & Plan:   #1 type 2 diabetes well controlled with A1c 6.0% -Continue current medication regimen  #2 hypertension stable and at goal  #3 rhinorrhea.  Suspect perennial allergic rhinitis -Recommend trial of over-the-counter Xyzal 5 mg nightly -Consider trial of Singulair if not improving with the above.  He is already tried Flonase without benefit  #4 Alzheimer's dementia.  Recent initiation of Namenda.  He also remains on Aricept  -MMSE 28/30 which is stable.  Consider repeating this in 6 months   No orders of the defined types were placed in this encounter.   Follow-up: Return in about 3 months (around 01/02/2020).    Carolann Littler, MD

## 2019-10-23 ENCOUNTER — Other Ambulatory Visit: Payer: Self-pay | Admitting: Family Medicine

## 2019-11-13 ENCOUNTER — Other Ambulatory Visit: Payer: Self-pay | Admitting: Family Medicine

## 2019-11-24 DIAGNOSIS — R0989 Other specified symptoms and signs involving the circulatory and respiratory systems: Secondary | ICD-10-CM | POA: Diagnosis not present

## 2019-11-24 DIAGNOSIS — R21 Rash and other nonspecific skin eruption: Secondary | ICD-10-CM | POA: Diagnosis not present

## 2019-11-24 DIAGNOSIS — J0100 Acute maxillary sinusitis, unspecified: Secondary | ICD-10-CM | POA: Diagnosis not present

## 2019-11-24 DIAGNOSIS — R059 Cough, unspecified: Secondary | ICD-10-CM | POA: Diagnosis not present

## 2019-11-24 DIAGNOSIS — R0981 Nasal congestion: Secondary | ICD-10-CM | POA: Diagnosis not present

## 2019-11-28 DIAGNOSIS — E1151 Type 2 diabetes mellitus with diabetic peripheral angiopathy without gangrene: Secondary | ICD-10-CM | POA: Diagnosis not present

## 2019-11-28 DIAGNOSIS — M79676 Pain in unspecified toe(s): Secondary | ICD-10-CM | POA: Diagnosis not present

## 2019-11-28 DIAGNOSIS — L84 Corns and callosities: Secondary | ICD-10-CM | POA: Diagnosis not present

## 2019-11-28 DIAGNOSIS — B351 Tinea unguium: Secondary | ICD-10-CM | POA: Diagnosis not present

## 2019-12-27 ENCOUNTER — Ambulatory Visit (INDEPENDENT_AMBULATORY_CARE_PROVIDER_SITE_OTHER): Payer: Medicare HMO

## 2019-12-27 DIAGNOSIS — Z0000 Encounter for general adult medical examination without abnormal findings: Secondary | ICD-10-CM | POA: Diagnosis not present

## 2019-12-27 DIAGNOSIS — Z1211 Encounter for screening for malignant neoplasm of colon: Secondary | ICD-10-CM | POA: Diagnosis not present

## 2019-12-27 NOTE — Progress Notes (Signed)
Subjective:   [name] is a 84 y.o. male who presents for Medicare Annual/Subsequent preventive examination.  I connected with [name] today by telephone and verified that I am speaking with the correct person using two identifiers. Location patient: home Location provider: work Persons participating in the virtual visit: patient, provider.   I discussed the limitations, risks, security and privacy concerns of performing an evaluation and management service by telephone and the availability of in person appointments. I also discussed with the patient that there may be a patient responsible charge related to this service. The patient expressed understanding and verbally consented to this telephonic visit.    Interactive audio and video telecommunications were attempted between this provider and patient, however failed, due to patient having technical difficulties OR patient did not have access to video capability.  We continued and completed visit with audio only.      Review of Systems    N/A  Cardiac Risk Factors include: advanced age (>28men, >24 women);diabetes mellitus;male gender     Objective:    Today's Vitals   There is no height or weight on file to calculate BMI.  Advanced Directives 12/27/2019 01/17/2019 09/14/2017 09/14/2017 09/14/2017 09/10/2016 09/17/2014  Does Patient Have a Medical Advance Directive? Yes Yes Yes Yes Yes Yes Yes  Type of Paramedic of [place];Living will - - - - Press photographer;Living will [place];Living will  Does patient want to make changes to medical advance directive? No - Patient declined - - - - No - Patient declined -  Copy of [place] in Chart? No - copy requested - - - - No - copy requested -    Current Medications (verified) Outpatient Encounter Medications as of 12/27/2019  Medication Sig  . ACCU-CHEK AVIVA PLUS test strip CHECK BLOOD SUGAR ONE  TIME DAILY  . Accu-Chek FastClix Lancets MISC CHECK BLOOD SUGAR 2 TIMES DAILY OR AS NEEDED  . amLODipine (NORVASC) 5 MG tablet TAKE 1 TABLET EVERY DAY  . aspirin EC 325 MG tablet Take 325 mg by mouth daily.  . benazepril (LOTENSIN) 20 MG tablet TAKE 1 TABLET EVERY DAY  . Blood Glucose Calibration (ACCU-CHEK AVIVA) SOLN USE AS DIRECTED  . donepezil (ARICEPT) 10 MG tablet TAKE 1 TABLET AT BEDTIME  . escitalopram (LEXAPRO) 10 MG tablet Take 1 tablet (10 mg total) by mouth daily.  Marland Kitchen levothyroxine (SYNTHROID) 112 MCG tablet TAKE 1 TABLET EVERY DAY  . memantine (NAMENDA) 10 MG tablet Take 1 tablet (10 mg total) by mouth 2 (two) times daily.  . mirabegron ER (MYRBETRIQ) 50 MG TB24 tablet Take 50 mg by mouth daily.  . Omega-3 Fatty Acids (FISH OIL) 1200 MG CAPS Take 1 capsule by mouth daily.  . Potassium 99 MG TABS Take 1 tablet by mouth daily.  . propranolol (INDERAL) 40 MG tablet TAKE 1 TABLET EVERY DAY  . simvastatin (ZOCOR) 40 MG tablet TAKE 1 TABLET AT BEDTIME   Facility-Administered Encounter Medications as of 12/27/2019  Medication  . cyanocobalamin ((VITAMIN B-12)) injection 1,000 mcg    Allergies (verified) Patient has no known allergies.   History: Past Medical History:  Diagnosis Date  . CELLULITIS, RIGHT LEG 04/22/2009  . DM w/o complication type II 10/11/2991  . ECZEMA 05/08/2008  . HYPERLIPIDEMIA 05/08/2008  . HYPOTHYROIDISM 05/08/2008  . KNEE, ARTHRITIS, DEGEN./OSTEO 03/25/2009  . SUPERFICIAL PHLEBITIS 12/27/2008  . Unspecified essential hypertension 05/08/2008   Past Surgical History:  Procedure Laterality  Date  . CATARACT EXTRACTION  2005   bilateral  . CHOLECYSTECTOMY    . COLONOSCOPY  04-15-2004   tics  . KNEE SURGERY     TKR Dr Alma Friendly 2007  . nasal polyps    . TONSILLECTOMY  1946  . WRIST SURGERY  2015   right side   Family History  Problem Relation Age of Onset  . Arthritis Other   . Hypertension Other   . Heart disease Other   . Colon cancer Neg Hx     Social History   Socioeconomic History  . Marital status: Married    Spouse name: Not on file  . Number of children: Not on file  . Years of education: Not on file  . Highest education level: Not on file  Occupational History  . Not on file  Tobacco Use  . Smoking status: Never Smoker  . Smokeless tobacco: Never Used  Vaping Use  . Vaping Use: Never used  Substance and Sexual Activity  . Alcohol use: No    Alcohol/week: 0.0 standard drinks  . Drug use: No  . Sexual activity: Not on file  Other Topics Concern  . Not on file  Social History Narrative  . Not on file   Social Determinants of Health   Financial Resource Strain: Low Risk   . Difficulty of Paying Living Expenses: Not hard at all  Food Insecurity: No Food Insecurity  . Worried About Charity fundraiser in the Last Year: Never true  . Ran Out of Food in the Last Year: Never true  Transportation Needs: No Transportation Needs  . Lack of Transportation (Medical): No  . Lack of Transportation (Non-Medical): No  Physical Activity: Insufficiently Active  . Days of Exercise per Week: 7 days  . Minutes of Exercise per Session: 20 min  Stress: No Stress Concern Present  . Feeling of Stress : Not at all  Social Connections: Moderately Integrated  . Frequency of Communication with Friends and Family: More than three times a week  . Frequency of Social Gatherings with Friends and Family: More than three times a week  . Attends Religious Services: More than 4 times per year  . Active Member of Clubs or Organizations: No  . Attends Archivist Meetings: Never  . Marital Status: Married    Tobacco Counseling Counseling given: Not Answered   Clinical Intake:  Pre-visit preparation completed: Yes  Pain : No/denies pain     Nutritional Risks: None Diabetes: Yes CBG done?: No Did pt. bring in CBG monitor from home?: No  How often do you need to have someone help you when you read instructions,  pamphlets, or other written materials from your doctor or pharmacy?: 2 - Rarely What is the last grade level you completed in school?: 12th grade  Diabetic?Yes Nutrition Risk Assessment:  Has the patient had any N/V/D within the last 2 months?  No  Does the patient have any non-healing wounds?  No  Has the patient had any unintentional weight loss or weight gain?  No   Diabetes:  Is the patient diabetic?  Yes  If diabetic, was a CBG obtained today?  No  Did the patient bring in their glucometer from home?  No  How often do you monitor your CBG's? Patient checks his glucose once a day .   Financial Strains and Diabetes Management:  Are you having any financial strains with the device, your supplies or your medication? No .  Does  the patient want to be seen by Chronic Care Management for management of their diabetes?  No  Would the patient like to be referred to a Nutritionist or for Diabetic Management?  No   Diabetic Exams:  Diabetic Eye Exam: Overdue for diabetic eye exam. Pt has been advised about the importance in completing this exam. Patient advised to call and schedule an eye exam. Diabetic Foot Exam: Overdue, Pt has been advised about the importance in completing this exam. Pt is scheduled for diabetic foot exam on 01/02/2020.   Interpreter Needed?: No  Information entered by :: [place] of Daily Living In your present state of health, do you have any difficulty performing the following activities: 12/27/2019  Hearing? Y  Comment Has hearing aids  Vision? N  Difficulty concentrating or making decisions? Y  Walking or climbing stairs? N  Dressing or bathing? N  Doing errands, shopping? N  Preparing Food and eating ? N  Using the Toilet? N  In the past six months, have you accidently leaked urine? Y  Do you have problems with loss of bowel control? Y  Managing your Medications? N  Managing your Finances? N  Housekeeping or managing your  Housekeeping? N  Some recent data might be hidden    Patient Care Team: [name], MD as PCP - General  Indicate any recent Medical Services you may have received from other than [facility] providers in the past year (date may be approximate).     Assessment:   This is a routine wellness examination for [place].  Hearing/Vision screen  Hearing Screening   125Hz  250Hz  500Hz  1000Hz  2000Hz  3000Hz  4000Hz  6000Hz  8000Hz   Right ear:           Left ear:           Vision Screening Comments: Patient gets eyes checked once per year    Dietary issues and exercise activities discussed: Current Exercise Habits: Home exercise routine, Type of exercise: walking, Time (Minutes): 15, Frequency (Times/Week): 7, Weekly Exercise (Minutes/Week): 105, Intensity: Mild  Goals    . Exercise 3x per week (30 min per time)    . Get back into the gym.     . Patient Stated     To try and maintain health and keep the wife happy     . Patient Stated     I would like to walk more       Depression Screen PHQ 2/9 Scores 12/27/2019 12/09/2018 09/14/2017 11/27/2016 09/10/2016 04/28/2016 01/07/2015  PHQ - 2 Score 0 5 0 1 0 0 0  PHQ- 9 Score 0 19 - 2 - - -    Fall Risk Fall Risk  12/27/2019 01/17/2019 12/09/2018 09/14/2017 09/10/2016  Falls in the past year? 1 1 1  No No  Comment - - - 5 to 84 yo fell x 3  -  Number falls in past yr: 1 1 0 - -  Injury with Fall? 1 1 0 - -  Risk Factor Category  - - - - -  Risk for fall due to : Impaired balance/gait Impaired balance/gait;Impaired mobility;History of fall(s) History of fall(s) - -  Follow up Falls evaluation completed;Falls prevention discussed Education provided;Falls prevention discussed Falls evaluation completed - -    Any stairs in or around the home? Yes  If so, are there any without handrails? No  Home free of loose throw rugs in walkways, pet beds, electrical cords, etc? Yes  Adequate lighting in your home  to reduce risk of falls? Yes   ASSISTIVE  DEVICES UTILIZED TO PREVENT FALLS:  Life alert? Yes  Use of a cane, walker or w/c? No  Grab bars in the bathroom? No  Shower chair or bench in shower? No  Elevated toilet seat or a handicapped toilet? Yes    Cognitive Function: MMSE - Mini Mental State Exam 09/14/2017 10/28/2016  Not completed: Refused -  Orientation to time 2 3  Orientation to Place 4 5  Registration 3 3  Attention/ Calculation 5 5  Recall 0 3  Language- name 2 objects 2 2  Language- repeat 1 1  Language- follow 3 step command 3 3  Language- read & follow direction 1 1  Write a sentence 1 1  Copy design 1 1  Total score 23 28     6CIT Screen 12/27/2019  What Year? 4 points  What month? 0 points  What time? 0 points  Count back from 20 0 points  Months in reverse 4 points  Repeat phrase 10 points  Total Score 18    Immunizations Immunization History  Administered Date(s) Administered  . Influenza Split 12/01/2010, 11/28/2012  . Influenza Whole 11/25/2009  . Influenza, High Dose Seasonal PF 10/28/2016, 10/27/2017, 10/20/2018, 10/20/2018, 11/08/2019  . Influenza,inj,Quad PF,6+ Mos 11/29/2013  . Influenza-Unspecified 11/14/2014, 10/14/2015  . Moderna SARS-COVID-2 Vaccination 04/06/2019, 05/05/2019  . Pneumococcal Conjugate-13 07/04/2014  . Pneumococcal Polysaccharide-23 02/24/2007  . Td 07/11/2009  . Zoster 07/07/2010  . Zoster Recombinat (Shingrix) 10/20/2018, 03/08/2019    TDAP status: Due, Education has been provided regarding the importance of this vaccine. Advised may receive this vaccine at local pharmacy or Health Dept. Aware to provide a copy of the vaccination record if obtained from local pharmacy or Health Dept. Verbalized acceptance and understanding. Flu Vaccine status: Up to date Pneumococcal vaccine status: Up to date Covid-19 vaccine status: Completed vaccines  Qualifies for Shingles Vaccine? Yes   Zostavax completed Yes   Shingrix Completed?: Yes  Screening Tests Health  Maintenance  Topic Date Due  . TETANUS/TDAP  07/12/2019  . COLONOSCOPY  10/01/2019  . OPHTHALMOLOGY EXAM  10/10/2019  . FOOT EXAM  12/02/2019  . HEMOGLOBIN A1C  04/03/2020  . INFLUENZA VACCINE  Completed  . COVID-19 Vaccine  Completed  . PNA vac Low Risk Adult  Completed    Health Maintenance  Health Maintenance Due  Topic Date Due  . TETANUS/TDAP  07/12/2019  . COLONOSCOPY  10/01/2019  . OPHTHALMOLOGY EXAM  10/10/2019  . FOOT EXAM  12/02/2019    Colorectal Cancer Screening: Currently due orders placed 12/27/2019  Lung Cancer Screening: (Low Dose CT Chest recommended if Age 99-80 years, 30 pack-year currently smoking OR have quit w/in 15years.) does not qualify.   Lung Cancer Screening Referral: N/A   Additional Screening:  Hepatitis C Screening: does not qualify;   Vision Screening: Recommended annual ophthalmology exams for early detection of glaucoma and other disorders of the eye. Is the patient up to date with their annual eye exam?  Yes  Who is the provider or what is the name of the office in which the patient attends annual eye exams? [facility] If pt is not established with a provider, would they like to be referred to a provider to establish care? No .   Dental Screening: Recommended annual dental exams for proper oral hygiene  Community Resource Referral / Chronic Care Management: CRR required this visit?  No   CCM required this visit?  No  Plan:     I have personally reviewed and noted the following in the patient's chart:   . Medical and social history . Use of alcohol, tobacco or illicit drugs  . Current medications and supplements . Functional ability and status . Nutritional status . Physical activity . Advanced directives . List of other physicians . Hospitalizations, surgeries, and ER visits in previous 12 months . Vitals . Screenings to include cognitive, depression, and falls . Referrals and appointments  In addition, I  have reviewed and discussed with patient certain preventive protocols, quality metrics, and best practice recommendations. A written personalized care plan for preventive services as well as general preventive health recommendations were provided to patient.     Ofilia Neas, LPN   [id number]   Nurse Notes: None

## 2019-12-27 NOTE — Patient Instructions (Addendum)
[name] , Thank you for taking time to come for your Medicare Wellness Visit. I appreciate your ongoing commitment to your health goals. Please review the following plan we discussed and let me know if I can assist you in the future.   Screening recommendations/referrals: Colonoscopy: Currently due for repeat colonoscopy based off or results in 2016. Order placed for gastroenterology  Recommended yearly ophthalmology/optometry visit for glaucoma screening and checkup Recommended yearly dental visit for hygiene and checkup  Vaccinations: Influenza vaccine: Up to date, next due September 2022 Pneumococcal vaccine: Completed series Tdap vaccine: Currently due, you may receive at your next in person office visit. Shingles vaccine: Complete series    Advanced directives: Please bring a copy of your advanced medical directives into our office so that we may scan this into your chart.  Conditions/risks identified: None   Next appointment: 01/01/2021 @ 10:00 am with Dr.Burchette   Preventive Care 29 Years and Older, Male Preventive care refers to lifestyle choices and visits with your health care provider that can promote health and wellness. What does preventive care include?  A yearly physical exam. This is also called an annual well check.  Dental exams once or twice a year.  Routine eye exams. Ask your health care provider how often you should have your eyes checked.  Personal lifestyle choices, including:  Daily care of your teeth and gums.  Regular physical activity.  Eating a healthy diet.  Avoiding tobacco and drug use.  Limiting alcohol use.  Practicing safe sex.  Taking low doses of aspirin every day.  Taking vitamin and mineral supplements as recommended by your health care provider. What happens during an annual well check? The services and screenings done by your health care provider during your annual well check will depend on your age, overall health,  lifestyle risk factors, and family history of disease. Counseling  Your health care provider may ask you questions about your:  Alcohol use.  Tobacco use.  Drug use.  Emotional well-being.  Home and relationship well-being.  Sexual activity.  Eating habits.  History of falls.  Memory and ability to understand (cognition).  Work and work Statistician. Screening  You may have the following tests or measurements:  Height, weight, and BMI.  Blood pressure.  Lipid and cholesterol levels. These may be checked every 5 years, or more frequently if you are over 82 years old.  Skin check.  Lung cancer screening. You may have this screening every year starting at age 82 if you have a 30-pack-year history of smoking and currently smoke or have quit within the past 15 years.  Fecal occult blood test (FOBT) of the stool. You may have this test every year starting at age 82.  Flexible sigmoidoscopy or colonoscopy. You may have a sigmoidoscopy every 5 years or a colonoscopy every 10 years starting at age 82.  Prostate cancer screening. Recommendations will vary depending on your family history and other risks.  Hepatitis C blood test.  Hepatitis B blood test.  Sexually transmitted disease (STD) testing.  Diabetes screening. This is done by checking your blood sugar (glucose) after you have not eaten for a while (fasting). You may have this done every 1-3 years.  Abdominal aortic aneurysm (AAA) screening. You may need this if you are a current or former smoker.  Osteoporosis. You may be screened starting at age 82 if you are at high risk. Talk with your health care provider about your test results, treatment options, and if necessary,  the need for more tests. Vaccines  Your health care provider may recommend certain vaccines, such as:  Influenza vaccine. This is recommended every year.  Tetanus, diphtheria, and acellular pertussis (Tdap, Td) vaccine. You may need a Td booster  every 10 years.  Zoster vaccine. You may need this after age 82.  Pneumococcal 13-valent conjugate (PCV13) vaccine. One dose is recommended after age 82.  Pneumococcal polysaccharide (PPSV23) vaccine. One dose is recommended after age 82. Talk to your health care provider about which screenings and vaccines you need and how often you need them. This information is not intended to replace advice given to you by your health care provider. Make sure you discuss any questions you have with your health care provider. Document Released: 03/08/2015 Document Revised: 10/30/2015 Document Reviewed: 12/11/2014 Elsevier Interactive Patient Education  2017 [place] Prevention in the Home Falls can cause injuries. They can happen to people of all ages. There are many things you can do to make your home safe and to help prevent falls. What can I do on the outside of my home?  Regularly fix the edges of walkways and driveways and fix any cracks.  Remove anything that might make you trip as you walk through a door, such as a raised step or threshold.  Trim any bushes or trees on the path to your home.  Use bright outdoor lighting.  Clear any walking paths of anything that might make someone trip, such as rocks or tools.  Regularly check to see if handrails are loose or broken. Make sure that both sides of any steps have handrails.  Any raised decks and porches should have guardrails on the edges.  Have any leaves, snow, or ice cleared regularly.  Use sand or salt on walking paths during winter.  Clean up any spills in your garage right away. This includes oil or grease spills. What can I do in the bathroom?  Use night lights.  Install grab bars by the toilet and in the tub and shower. Do not use towel bars as grab bars.  Use non-skid mats or decals in the tub or shower.  If you need to sit down in the shower, use a plastic, non-slip stool.  Keep the floor dry. Clean up any  water that spills on the floor as soon as it happens.  Remove soap buildup in the tub or shower regularly.  Attach bath mats securely with double-sided non-slip rug tape.  Do not have throw rugs and other things on the floor that can make you trip. What can I do in the bedroom?  Use night lights.  Make sure that you have a light by your bed that is easy to reach.  Do not use any sheets or blankets that are too big for your bed. They should not hang down onto the floor.  Have a firm chair that has side arms. You can use this for support while you get dressed.  Do not have throw rugs and other things on the floor that can make you trip. What can I do in the kitchen?  Clean up any spills right away.  Avoid walking on wet floors.  Keep items that you use a lot in easy-to-reach places.  If you need to reach something above you, use a strong step stool that has a grab bar.  Keep electrical cords out of the way.  Do not use floor polish or wax that makes floors slippery. If you must use  wax, use non-skid floor wax.  Do not have throw rugs and other things on the floor that can make you trip. What can I do with my stairs?  Do not leave any items on the stairs.  Make sure that there are handrails on both sides of the stairs and use them. Fix handrails that are broken or loose. Make sure that handrails are as long as the stairways.  Check any carpeting to make sure that it is firmly attached to the stairs. Fix any carpet that is loose or worn.  Avoid having throw rugs at the top or bottom of the stairs. If you do have throw rugs, attach them to the floor with carpet tape.  Make sure that you have a light switch at the top of the stairs and the bottom of the stairs. If you do not have them, ask someone to add them for you. What else can I do to help prevent falls?  Wear shoes that:  Do not have high heels.  Have rubber bottoms.  Are comfortable and fit you well.  Are closed  at the toe. Do not wear sandals.  If you use a stepladder:  Make sure that it is fully opened. Do not climb a closed stepladder.  Make sure that both sides of the stepladder are locked into place.  Ask someone to hold it for you, if possible.  Clearly mark and make sure that you can see:  Any grab bars or handrails.  First and last steps.  Where the edge of each step is.  Use tools that help you move around (mobility aids) if they are needed. These include:  Canes.  Walkers.  Scooters.  Crutches.  Turn on the lights when you go into a dark area. Replace any light bulbs as soon as they burn out.  Set up your furniture so you have a clear path. Avoid moving your furniture around.  If any of your floors are uneven, fix them.  If there are any pets around you, be aware of where they are.  Review your medicines with your doctor. Some medicines can make you feel dizzy. This can increase your chance of falling. Ask your doctor what other things that you can do to help prevent falls. This information is not intended to replace advice given to you by your health care provider. Make sure you discuss any questions you have with your health care provider. Document Released: 12/06/2008 Document Revised: 07/18/2015 Document Reviewed: 03/16/2014 Elsevier Interactive Patient Education  2017 [employer].

## 2020-01-02 ENCOUNTER — Encounter: Payer: Self-pay | Admitting: Family Medicine

## 2020-01-02 ENCOUNTER — Other Ambulatory Visit: Payer: Self-pay

## 2020-01-02 ENCOUNTER — Ambulatory Visit (INDEPENDENT_AMBULATORY_CARE_PROVIDER_SITE_OTHER): Payer: Medicare HMO | Admitting: Family Medicine

## 2020-01-02 VITALS — BP 112/70 | HR 57 | Temp 97.7°F | Ht 71.0 in | Wt 187.7 lb

## 2020-01-02 DIAGNOSIS — I10 Essential (primary) hypertension: Secondary | ICD-10-CM | POA: Diagnosis not present

## 2020-01-02 DIAGNOSIS — E039 Hypothyroidism, unspecified: Secondary | ICD-10-CM

## 2020-01-02 DIAGNOSIS — E119 Type 2 diabetes mellitus without complications: Secondary | ICD-10-CM | POA: Diagnosis not present

## 2020-01-02 DIAGNOSIS — E785 Hyperlipidemia, unspecified: Secondary | ICD-10-CM

## 2020-01-02 DIAGNOSIS — N1831 Chronic kidney disease, stage 3a: Secondary | ICD-10-CM

## 2020-01-02 LAB — LIPID PANEL
Cholesterol: 113 mg/dL (ref 0–200)
HDL: 35.1 mg/dL — ABNORMAL LOW (ref >39.00)
LDL Cholesterol: 59 mg/dL (ref 0–99)
NonHDL: 77.95
Total CHOL/HDL Ratio: 3
Triglycerides: 93 mg/dL (ref 0.0–149.0)
VLDL: 18.6 mg/dL (ref 0.0–40.0)

## 2020-01-02 LAB — BASIC METABOLIC PANEL WITH GFR
BUN: 16 mg/dL (ref 6–23)
CO2: 33 meq/L — ABNORMAL HIGH (ref 19–32)
Calcium: 9.4 mg/dL (ref 8.4–10.5)
Chloride: 99 meq/L (ref 96–112)
Creatinine, Ser: 1.25 mg/dL (ref 0.40–1.50)
GFR: 53.62 mL/min — ABNORMAL LOW
Glucose, Bld: 102 mg/dL — ABNORMAL HIGH (ref 70–99)
Potassium: 4.6 meq/L (ref 3.5–5.1)
Sodium: 136 meq/L (ref 135–145)

## 2020-01-02 LAB — HEPATIC FUNCTION PANEL
ALT: 21 U/L (ref 0–53)
AST: 26 U/L (ref 0–37)
Albumin: 4 g/dL (ref 3.5–5.2)
Alkaline Phosphatase: 51 U/L (ref 39–117)
Bilirubin, Direct: 0.3 mg/dL (ref 0.0–0.3)
Total Bilirubin: 1.5 mg/dL — ABNORMAL HIGH (ref 0.2–1.2)
Total Protein: 6.6 g/dL (ref 6.0–8.3)

## 2020-01-02 LAB — POCT GLYCOSYLATED HEMOGLOBIN (HGB A1C)
HbA1c POC (<> result, manual entry): 6 % (ref 4.0–5.6)
HbA1c, POC (controlled diabetic range): 6 % (ref 0.0–7.0)
HbA1c, POC (prediabetic range): 6 % (ref 5.7–6.4)
Hemoglobin A1C: 6 % — AB (ref 4.0–5.6)

## 2020-01-02 LAB — TSH: TSH: 5.47 u[IU]/mL — ABNORMAL HIGH (ref 0.35–4.50)

## 2020-01-02 NOTE — Progress Notes (Signed)
Established Patient Office Visit  Subjective:  Patient ID: [name], male    DOB: 1935/12/17  Age: 84 y.o. MRN: [id number]  CC:  Chief Complaint  Patient presents with  . Follow-up    3 month follow , nasal drainage, swelling in rt leg     HPI [name] presents for medical follow-up. He has history of hypertension, hypothyroidism, type 2 diabetes, dementia, history of recurrent depression, chronic kidney disease. Medications reviewed. His blood sugars have been stable currently off medications.   He has eye exam scheduled for next week. Not monitoring blood sugars regularly. He has some urine urgency which is chronic and has been followed by urology and was treated with Myrbetriq but no significant polyuria or polydipsia.  He has some chronic edema right lower extremity greater than left. Edema is worse late in the day and improves with elevation.  He has hypothyroidism and hyperlipidemia and is due for follow-up labs. He has hypertension which has been stable with amlodipine, benazepril, and propranolol which he has been on for several years.  Past Medical History:  Diagnosis Date  . CELLULITIS, RIGHT LEG 04/22/2009  . DM w/o complication type II 8/34/1962  . ECZEMA 05/08/2008  . HYPERLIPIDEMIA 05/08/2008  . HYPOTHYROIDISM 05/08/2008  . KNEE, ARTHRITIS, DEGEN./OSTEO 03/25/2009  . SUPERFICIAL PHLEBITIS 12/27/2008  . Unspecified essential hypertension 05/08/2008    Past Surgical History:  Procedure Laterality Date  . CATARACT EXTRACTION  2005   bilateral  . CHOLECYSTECTOMY    . COLONOSCOPY  04-15-2004   tics  . KNEE SURGERY     TKR Dr Alma Friendly 2007  . nasal polyps    . TONSILLECTOMY  1946  . WRIST SURGERY  2015   right side    Family History  Problem Relation Age of Onset  . Arthritis Other   . Hypertension Other   . Heart disease Other   . Colon cancer Neg Hx     Social History   Socioeconomic History  . Marital status: Married    Spouse name: Not on  file  . Number of children: Not on file  . Years of education: Not on file  . Highest education level: Not on file  Occupational History  . Not on file  Tobacco Use  . Smoking status: Never Smoker  . Smokeless tobacco: Never Used  Vaping Use  . Vaping Use: Never used  Substance and Sexual Activity  . Alcohol use: No    Alcohol/week: 0.0 standard drinks  . Drug use: No  . Sexual activity: Not on file  Other Topics Concern  . Not on file  Social History Narrative  . Not on file   Social Determinants of Health   Financial Resource Strain: Low Risk   . Difficulty of Paying Living Expenses: Not hard at all  Food Insecurity: No Food Insecurity  . Worried About Charity fundraiser in the Last Year: Never true  . Ran Out of Food in the Last Year: Never true  Transportation Needs: No Transportation Needs  . Lack of Transportation (Medical): No  . Lack of Transportation (Non-Medical): No  Physical Activity: Insufficiently Active  . Days of Exercise per Week: 7 days  . Minutes of Exercise per Session: 20 min  Stress: No Stress Concern Present  . Feeling of Stress : Not at all  Social Connections: Moderately Integrated  . Frequency of Communication with Friends and Family: More than three times a week  . Frequency of Social  Gatherings with Friends and Family: More than three times a week  . Attends Religious Services: More than 4 times per year  . Active Member of Clubs or Organizations: No  . Attends Archivist Meetings: Never  . Marital Status: Married  Human resources officer Violence: Not At Risk  . Fear of Current or Ex-Partner: No  . Emotionally Abused: No  . Physically Abused: No  . Sexually Abused: No    Outpatient Medications Prior to Visit  Medication Sig Dispense Refill  . ACCU-CHEK AVIVA PLUS test strip CHECK BLOOD SUGAR ONE TIME DAILY 100 strip 1  . Accu-Chek FastClix Lancets MISC CHECK BLOOD SUGAR 2 TIMES DAILY OR AS NEEDED 204 each 1  . amLODipine (NORVASC)  5 MG tablet TAKE 1 TABLET EVERY DAY 90 tablet 3  . aspirin EC 325 MG tablet Take 325 mg by mouth daily.    . benazepril (LOTENSIN) 20 MG tablet TAKE 1 TABLET EVERY DAY 90 tablet 2  . Blood Glucose Calibration (ACCU-CHEK AVIVA) SOLN USE AS DIRECTED 1 each 1  . donepezil (ARICEPT) 10 MG tablet TAKE 1 TABLET AT BEDTIME 90 tablet 2  . escitalopram (LEXAPRO) 10 MG tablet Take 1 tablet (10 mg total) by mouth daily. 30 tablet 5  . levothyroxine (SYNTHROID) 112 MCG tablet TAKE 1 TABLET EVERY DAY 90 tablet 2  . memantine (NAMENDA) 10 MG tablet Take 1 tablet (10 mg total) by mouth 2 (two) times daily. 180 tablet 3  . mirabegron ER (MYRBETRIQ) 50 MG TB24 tablet Take 50 mg by mouth daily.    . Omega-3 Fatty Acids (FISH OIL) 1200 MG CAPS Take 1 capsule by mouth daily.    . Potassium 99 MG TABS Take 1 tablet by mouth daily.    . propranolol (INDERAL) 40 MG tablet TAKE 1 TABLET EVERY DAY 90 tablet 2  . simvastatin (ZOCOR) 40 MG tablet TAKE 1 TABLET AT BEDTIME 90 tablet 2   Facility-Administered Medications Prior to Visit  Medication Dose Route Frequency Provider Last Rate Last Admin  . cyanocobalamin ((VITAMIN B-12)) injection 1,000 mcg  1,000 mcg Intramuscular Q30 days [name], MD   1,000 mcg at 09/23/15 1206    No Known Allergies  ROS Review of Systems  Constitutional: Negative for fatigue and unexpected weight change.  Eyes: Negative for visual disturbance.  Respiratory: Negative for cough, chest tightness and shortness of breath.   Cardiovascular: Negative for chest pain, palpitations and leg swelling.  Endocrine: Negative for polydipsia and polyuria.  Neurological: Negative for dizziness, syncope, weakness, light-headedness and headaches.      Objective:    Physical Exam Constitutional:      Appearance: He is well-developed.  HENT:     Right Ear: External ear normal.     Left Ear: External ear normal.  Eyes:     Pupils: Pupils are equal, round, and reactive to light.  Neck:      Thyroid: No thyromegaly.  Cardiovascular:     Rate and Rhythm: Normal rate and regular rhythm.  Pulmonary:     Effort: Pulmonary effort is normal. No respiratory distress.     Breath sounds: Normal breath sounds. No wheezing or rales.  Musculoskeletal:     Cervical back: Neck supple.     Comments: He has some mild edema bilaterally right greater than left. Good capillary refill.  Skin:    Comments: Foot lesions. He has impaired monofilament left volar surface greater than right. Good distal pulses. No ulcers.  Neurological:  Mental Status: He is alert and oriented to person, place, and time.     BP 112/70 (BP Location: Left Arm, Patient Position: Sitting, Cuff Size: Normal)   Pulse (!) 57   Temp 97.7 F (36.5 C) (Oral)   Ht 5\' 11"  (1.803 m)   Wt 187 lb 11.2 oz (85.1 kg)   SpO2 97%   BMI 26.18 kg/m  Wt Readings from Last 3 Encounters:  01/02/20 187 lb 11.2 oz (85.1 kg)  10/02/19 184 lb 8 oz (83.7 kg)  06/30/19 181 lb 4.8 oz (82.2 kg)     Health Maintenance Due  Topic Date Due  . TETANUS/TDAP  07/12/2019  . OPHTHALMOLOGY EXAM  10/10/2019    There are no preventive care reminders to display for this patient.  Lab Results  Component Value Date   TSH 3.48 12/27/2018   Lab Results  Component Value Date   WBC 11.7 (H) 12/27/2018   HGB 14.7 12/27/2018   HCT 44.4 12/27/2018   MCV 91.3 12/27/2018   PLT 317.0 12/27/2018   Lab Results  Component Value Date   NA 133 (L) 01/10/2019   K 3.5 01/10/2019   CO2 31 01/10/2019   GLUCOSE 150 (H) 01/10/2019   BUN 13 01/10/2019   CREATININE 1.08 01/10/2019   BILITOT 3.4 (H) 12/27/2018   ALKPHOS 51 12/27/2018   AST 23 12/27/2018   ALT 25 12/27/2018   PROT 7.4 12/27/2018   ALBUMIN 4.6 12/27/2018   CALCIUM 10.1 01/10/2019   GFR 65.53 01/10/2019   Lab Results  Component Value Date   CHOL 95 09/02/2018   Lab Results  Component Value Date   HDL 33.40 (L) 09/02/2018   Lab Results  Component Value Date   LDLCALC  44 09/02/2018   Lab Results  Component Value Date   TRIG 84.0 09/02/2018   Lab Results  Component Value Date   CHOLHDL 3 09/02/2018   Lab Results  Component Value Date   HGBA1C 6.0 (A) 01/02/2020   HGBA1C 6.0 01/02/2020   HGBA1C 6.0 01/02/2020   HGBA1C 6.0 01/02/2020      Assessment & Plan:   Problem List Items Addressed This Visit      Unprioritized   CKD (chronic kidney disease) stage 3, GFR 30-59 ml/min (HCC)   Relevant Orders   Basic metabolic panel   Hypothyroidism   Relevant Orders   TSH   Type 2 diabetes mellitus, controlled ([place]) - Primary   Relevant Orders   POC HgB A1c (Completed)   Hyperlipidemia   Relevant Orders   Lipid panel   Hepatic function panel   Essential hypertension    Diabetes remains well controlled with A1c today 6.0% -Continue with yearly eye exams. -Reassess in 6 months  Obtain screening labs as above including thyroid functions, lipid, hepatic, basic metabolic panel  Vaccines up-to-date  No orders of the defined types were placed in this encounter.   Follow-up: Return in about 6 months (around 07/01/2020).    Carolann Littler, MD

## 2020-01-04 ENCOUNTER — Other Ambulatory Visit: Payer: Self-pay | Admitting: Family Medicine

## 2020-02-06 DIAGNOSIS — I708 Atherosclerosis of other arteries: Secondary | ICD-10-CM | POA: Diagnosis not present

## 2020-02-06 DIAGNOSIS — N3941 Urge incontinence: Secondary | ICD-10-CM | POA: Diagnosis not present

## 2020-02-06 DIAGNOSIS — J9811 Atelectasis: Secondary | ICD-10-CM | POA: Diagnosis not present

## 2020-02-06 DIAGNOSIS — R0902 Hypoxemia: Secondary | ICD-10-CM | POA: Diagnosis not present

## 2020-02-06 DIAGNOSIS — A0472 Enterocolitis due to Clostridium difficile, not specified as recurrent: Secondary | ICD-10-CM | POA: Diagnosis not present

## 2020-02-06 DIAGNOSIS — F0390 Unspecified dementia without behavioral disturbance: Secondary | ICD-10-CM | POA: Diagnosis not present

## 2020-02-06 DIAGNOSIS — I10 Essential (primary) hypertension: Secondary | ICD-10-CM | POA: Diagnosis not present

## 2020-02-06 DIAGNOSIS — R29898 Other symptoms and signs involving the musculoskeletal system: Secondary | ICD-10-CM | POA: Diagnosis not present

## 2020-02-06 DIAGNOSIS — E039 Hypothyroidism, unspecified: Secondary | ICD-10-CM | POA: Diagnosis not present

## 2020-02-06 DIAGNOSIS — Z9181 History of falling: Secondary | ICD-10-CM | POA: Diagnosis not present

## 2020-02-06 DIAGNOSIS — S7291XA Unspecified fracture of right femur, initial encounter for closed fracture: Secondary | ICD-10-CM | POA: Diagnosis not present

## 2020-02-06 DIAGNOSIS — H2513 Age-related nuclear cataract, bilateral: Secondary | ICD-10-CM | POA: Diagnosis not present

## 2020-02-06 DIAGNOSIS — T8189XA Other complications of procedures, not elsewhere classified, initial encounter: Secondary | ICD-10-CM | POA: Diagnosis not present

## 2020-02-06 DIAGNOSIS — R509 Fever, unspecified: Secondary | ICD-10-CM | POA: Diagnosis not present

## 2020-02-06 DIAGNOSIS — I083 Combined rheumatic disorders of mitral, aortic and tricuspid valves: Secondary | ICD-10-CM | POA: Diagnosis not present

## 2020-02-06 DIAGNOSIS — G459 Transient cerebral ischemic attack, unspecified: Secondary | ICD-10-CM | POA: Diagnosis not present

## 2020-02-06 DIAGNOSIS — I493 Ventricular premature depolarization: Secondary | ICD-10-CM | POA: Diagnosis not present

## 2020-02-06 DIAGNOSIS — Z01818 Encounter for other preprocedural examination: Secondary | ICD-10-CM | POA: Diagnosis not present

## 2020-02-06 DIAGNOSIS — R4182 Altered mental status, unspecified: Secondary | ICD-10-CM | POA: Diagnosis not present

## 2020-02-06 DIAGNOSIS — S72001A Fracture of unspecified part of neck of right femur, initial encounter for closed fracture: Secondary | ICD-10-CM | POA: Diagnosis not present

## 2020-02-06 DIAGNOSIS — E119 Type 2 diabetes mellitus without complications: Secondary | ICD-10-CM | POA: Diagnosis not present

## 2020-02-06 DIAGNOSIS — Z96641 Presence of right artificial hip joint: Secondary | ICD-10-CM | POA: Diagnosis not present

## 2020-02-06 DIAGNOSIS — S79912A Unspecified injury of left hip, initial encounter: Secondary | ICD-10-CM | POA: Diagnosis not present

## 2020-02-06 DIAGNOSIS — E876 Hypokalemia: Secondary | ICD-10-CM | POA: Diagnosis not present

## 2020-02-06 DIAGNOSIS — Z471 Aftercare following joint replacement surgery: Secondary | ICD-10-CM | POA: Diagnosis not present

## 2020-02-06 DIAGNOSIS — E785 Hyperlipidemia, unspecified: Secondary | ICD-10-CM | POA: Diagnosis not present

## 2020-02-06 DIAGNOSIS — J90 Pleural effusion, not elsewhere classified: Secondary | ICD-10-CM | POA: Diagnosis not present

## 2020-02-06 DIAGNOSIS — F05 Delirium due to known physiological condition: Secondary | ICD-10-CM | POA: Diagnosis not present

## 2020-02-06 DIAGNOSIS — R296 Repeated falls: Secondary | ICD-10-CM | POA: Diagnosis not present

## 2020-02-06 DIAGNOSIS — S72051A Unspecified fracture of head of right femur, initial encounter for closed fracture: Secondary | ICD-10-CM | POA: Diagnosis not present

## 2020-02-06 DIAGNOSIS — M6281 Muscle weakness (generalized): Secondary | ICD-10-CM | POA: Diagnosis not present

## 2020-02-06 DIAGNOSIS — S72041D Displaced fracture of base of neck of right femur, subsequent encounter for closed fracture with routine healing: Secondary | ICD-10-CM | POA: Diagnosis not present

## 2020-02-06 DIAGNOSIS — A419 Sepsis, unspecified organism: Secondary | ICD-10-CM | POA: Diagnosis not present

## 2020-02-06 DIAGNOSIS — M7989 Other specified soft tissue disorders: Secondary | ICD-10-CM | POA: Diagnosis not present

## 2020-02-06 DIAGNOSIS — A0471 Enterocolitis due to Clostridium difficile, recurrent: Secondary | ICD-10-CM | POA: Diagnosis not present

## 2020-02-06 DIAGNOSIS — M71051 Abscess of bursa, right hip: Secondary | ICD-10-CM | POA: Diagnosis not present

## 2020-02-06 DIAGNOSIS — T8142XA Infection following a procedure, deep incisional surgical site, initial encounter: Secondary | ICD-10-CM | POA: Diagnosis not present

## 2020-02-06 DIAGNOSIS — D649 Anemia, unspecified: Secondary | ICD-10-CM | POA: Diagnosis not present

## 2020-02-06 DIAGNOSIS — R350 Frequency of micturition: Secondary | ICD-10-CM | POA: Diagnosis not present

## 2020-02-06 DIAGNOSIS — D62 Acute posthemorrhagic anemia: Secondary | ICD-10-CM | POA: Diagnosis not present

## 2020-02-06 DIAGNOSIS — F0280 Dementia in other diseases classified elsewhere without behavioral disturbance: Secondary | ICD-10-CM | POA: Diagnosis not present

## 2020-02-06 DIAGNOSIS — H40033 Anatomical narrow angle, bilateral: Secondary | ICD-10-CM | POA: Diagnosis not present

## 2020-02-06 DIAGNOSIS — S72001D Fracture of unspecified part of neck of right femur, subsequent encounter for closed fracture with routine healing: Secondary | ICD-10-CM | POA: Diagnosis not present

## 2020-02-06 DIAGNOSIS — W1830XA Fall on same level, unspecified, initial encounter: Secondary | ICD-10-CM | POA: Diagnosis not present

## 2020-02-06 DIAGNOSIS — T8140XA Infection following a procedure, unspecified, initial encounter: Secondary | ICD-10-CM | POA: Diagnosis not present

## 2020-02-06 DIAGNOSIS — T8142XD Infection following a procedure, deep incisional surgical site, subsequent encounter: Secondary | ICD-10-CM | POA: Diagnosis not present

## 2020-02-06 DIAGNOSIS — T8144XA Sepsis following a procedure, initial encounter: Secondary | ICD-10-CM | POA: Diagnosis not present

## 2020-02-06 DIAGNOSIS — B961 Klebsiella pneumoniae [K. pneumoniae] as the cause of diseases classified elsewhere: Secondary | ICD-10-CM | POA: Diagnosis not present

## 2020-02-06 DIAGNOSIS — R600 Localized edema: Secondary | ICD-10-CM | POA: Diagnosis not present

## 2020-02-06 DIAGNOSIS — Z20822 Contact with and (suspected) exposure to covid-19: Secondary | ICD-10-CM | POA: Diagnosis not present

## 2020-02-07 DIAGNOSIS — S72001A Fracture of unspecified part of neck of right femur, initial encounter for closed fracture: Secondary | ICD-10-CM | POA: Diagnosis not present

## 2020-02-07 DIAGNOSIS — R296 Repeated falls: Secondary | ICD-10-CM | POA: Diagnosis not present

## 2020-02-07 DIAGNOSIS — I083 Combined rheumatic disorders of mitral, aortic and tricuspid valves: Secondary | ICD-10-CM | POA: Diagnosis not present

## 2020-02-08 DIAGNOSIS — E039 Hypothyroidism, unspecified: Secondary | ICD-10-CM | POA: Diagnosis not present

## 2020-02-08 DIAGNOSIS — I10 Essential (primary) hypertension: Secondary | ICD-10-CM | POA: Diagnosis not present

## 2020-02-08 DIAGNOSIS — R296 Repeated falls: Secondary | ICD-10-CM | POA: Diagnosis not present

## 2020-02-08 DIAGNOSIS — F0390 Unspecified dementia without behavioral disturbance: Secondary | ICD-10-CM | POA: Diagnosis not present

## 2020-02-08 DIAGNOSIS — Z96641 Presence of right artificial hip joint: Secondary | ICD-10-CM | POA: Diagnosis not present

## 2020-02-08 DIAGNOSIS — Z471 Aftercare following joint replacement surgery: Secondary | ICD-10-CM | POA: Diagnosis not present

## 2020-02-08 DIAGNOSIS — S72051A Unspecified fracture of head of right femur, initial encounter for closed fracture: Secondary | ICD-10-CM | POA: Diagnosis not present

## 2020-02-08 DIAGNOSIS — S72001A Fracture of unspecified part of neck of right femur, initial encounter for closed fracture: Secondary | ICD-10-CM | POA: Diagnosis not present

## 2020-02-08 DIAGNOSIS — I493 Ventricular premature depolarization: Secondary | ICD-10-CM | POA: Diagnosis not present

## 2020-02-09 DIAGNOSIS — R296 Repeated falls: Secondary | ICD-10-CM | POA: Diagnosis not present

## 2020-02-09 DIAGNOSIS — S72001A Fracture of unspecified part of neck of right femur, initial encounter for closed fracture: Secondary | ICD-10-CM | POA: Diagnosis not present

## 2020-02-09 DIAGNOSIS — E039 Hypothyroidism, unspecified: Secondary | ICD-10-CM | POA: Diagnosis not present

## 2020-02-09 DIAGNOSIS — I10 Essential (primary) hypertension: Secondary | ICD-10-CM | POA: Diagnosis not present

## 2020-02-09 DIAGNOSIS — E785 Hyperlipidemia, unspecified: Secondary | ICD-10-CM | POA: Diagnosis not present

## 2020-02-10 DIAGNOSIS — I10 Essential (primary) hypertension: Secondary | ICD-10-CM | POA: Diagnosis not present

## 2020-02-10 DIAGNOSIS — F0390 Unspecified dementia without behavioral disturbance: Secondary | ICD-10-CM | POA: Diagnosis not present

## 2020-02-10 DIAGNOSIS — E039 Hypothyroidism, unspecified: Secondary | ICD-10-CM | POA: Diagnosis not present

## 2020-02-10 DIAGNOSIS — S72001A Fracture of unspecified part of neck of right femur, initial encounter for closed fracture: Secondary | ICD-10-CM | POA: Diagnosis not present

## 2020-02-11 DIAGNOSIS — S72001A Fracture of unspecified part of neck of right femur, initial encounter for closed fracture: Secondary | ICD-10-CM | POA: Diagnosis not present

## 2020-02-11 DIAGNOSIS — F0390 Unspecified dementia without behavioral disturbance: Secondary | ICD-10-CM | POA: Diagnosis not present

## 2020-02-11 DIAGNOSIS — R296 Repeated falls: Secondary | ICD-10-CM | POA: Diagnosis not present

## 2020-02-12 DIAGNOSIS — I10 Essential (primary) hypertension: Secondary | ICD-10-CM | POA: Diagnosis not present

## 2020-02-12 DIAGNOSIS — S72001A Fracture of unspecified part of neck of right femur, initial encounter for closed fracture: Secondary | ICD-10-CM | POA: Diagnosis not present

## 2020-02-12 DIAGNOSIS — F0390 Unspecified dementia without behavioral disturbance: Secondary | ICD-10-CM | POA: Diagnosis not present

## 2020-02-12 DIAGNOSIS — R296 Repeated falls: Secondary | ICD-10-CM | POA: Diagnosis not present

## 2020-02-13 DIAGNOSIS — T8189XA Other complications of procedures, not elsewhere classified, initial encounter: Secondary | ICD-10-CM | POA: Diagnosis not present

## 2020-02-13 DIAGNOSIS — S72001A Fracture of unspecified part of neck of right femur, initial encounter for closed fracture: Secondary | ICD-10-CM | POA: Diagnosis not present

## 2020-02-13 DIAGNOSIS — F0390 Unspecified dementia without behavioral disturbance: Secondary | ICD-10-CM | POA: Diagnosis not present

## 2020-02-14 DIAGNOSIS — S72001A Fracture of unspecified part of neck of right femur, initial encounter for closed fracture: Secondary | ICD-10-CM | POA: Diagnosis not present

## 2020-02-15 DIAGNOSIS — H40033 Anatomical narrow angle, bilateral: Secondary | ICD-10-CM | POA: Diagnosis not present

## 2020-02-15 DIAGNOSIS — Z96641 Presence of right artificial hip joint: Secondary | ICD-10-CM | POA: Diagnosis not present

## 2020-02-15 DIAGNOSIS — S72001A Fracture of unspecified part of neck of right femur, initial encounter for closed fracture: Secondary | ICD-10-CM | POA: Diagnosis not present

## 2020-02-15 DIAGNOSIS — T8142XD Infection following a procedure, deep incisional surgical site, subsequent encounter: Secondary | ICD-10-CM | POA: Diagnosis not present

## 2020-02-15 DIAGNOSIS — Z471 Aftercare following joint replacement surgery: Secondary | ICD-10-CM | POA: Diagnosis not present

## 2020-02-15 DIAGNOSIS — M7989 Other specified soft tissue disorders: Secondary | ICD-10-CM | POA: Diagnosis not present

## 2020-02-15 DIAGNOSIS — H2513 Age-related nuclear cataract, bilateral: Secondary | ICD-10-CM | POA: Diagnosis not present

## 2020-02-15 DIAGNOSIS — I708 Atherosclerosis of other arteries: Secondary | ICD-10-CM | POA: Diagnosis not present

## 2020-02-15 DIAGNOSIS — M71051 Abscess of bursa, right hip: Secondary | ICD-10-CM | POA: Diagnosis not present

## 2020-02-15 DIAGNOSIS — S72001D Fracture of unspecified part of neck of right femur, subsequent encounter for closed fracture with routine healing: Secondary | ICD-10-CM | POA: Diagnosis not present

## 2020-02-16 DIAGNOSIS — S72001A Fracture of unspecified part of neck of right femur, initial encounter for closed fracture: Secondary | ICD-10-CM | POA: Diagnosis not present

## 2020-02-16 DIAGNOSIS — R509 Fever, unspecified: Secondary | ICD-10-CM | POA: Diagnosis not present

## 2020-02-17 DIAGNOSIS — S72001A Fracture of unspecified part of neck of right femur, initial encounter for closed fracture: Secondary | ICD-10-CM | POA: Diagnosis not present

## 2020-02-18 DIAGNOSIS — S72001A Fracture of unspecified part of neck of right femur, initial encounter for closed fracture: Secondary | ICD-10-CM | POA: Diagnosis not present

## 2020-02-19 DIAGNOSIS — J90 Pleural effusion, not elsewhere classified: Secondary | ICD-10-CM | POA: Diagnosis not present

## 2020-02-19 DIAGNOSIS — J9811 Atelectasis: Secondary | ICD-10-CM | POA: Diagnosis not present

## 2020-02-20 DIAGNOSIS — F0280 Dementia in other diseases classified elsewhere without behavioral disturbance: Secondary | ICD-10-CM | POA: Diagnosis not present

## 2020-02-20 DIAGNOSIS — S72001D Fracture of unspecified part of neck of right femur, subsequent encounter for closed fracture with routine healing: Secondary | ICD-10-CM | POA: Diagnosis not present

## 2020-02-20 DIAGNOSIS — D649 Anemia, unspecified: Secondary | ICD-10-CM | POA: Diagnosis not present

## 2020-02-22 ENCOUNTER — Telehealth: Payer: Self-pay | Admitting: Family Medicine

## 2020-02-22 DIAGNOSIS — E785 Hyperlipidemia, unspecified: Secondary | ICD-10-CM | POA: Diagnosis not present

## 2020-02-22 DIAGNOSIS — I10 Essential (primary) hypertension: Secondary | ICD-10-CM | POA: Diagnosis not present

## 2020-02-22 DIAGNOSIS — S72001A Fracture of unspecified part of neck of right femur, initial encounter for closed fracture: Secondary | ICD-10-CM | POA: Diagnosis not present

## 2020-02-22 DIAGNOSIS — E876 Hypokalemia: Secondary | ICD-10-CM | POA: Diagnosis not present

## 2020-02-22 DIAGNOSIS — D649 Anemia, unspecified: Secondary | ICD-10-CM | POA: Diagnosis not present

## 2020-02-22 DIAGNOSIS — E039 Hypothyroidism, unspecified: Secondary | ICD-10-CM | POA: Diagnosis not present

## 2020-02-22 DIAGNOSIS — F0390 Unspecified dementia without behavioral disturbance: Secondary | ICD-10-CM | POA: Diagnosis not present

## 2020-02-22 NOTE — Telephone Encounter (Signed)
Patient spouse called and wanted to let the provider know that patient had a fall and broke his hip. Pt is [facility] and wanting to go to Rehab, please advise. CB is [phone]

## 2020-02-23 DIAGNOSIS — I10 Essential (primary) hypertension: Secondary | ICD-10-CM | POA: Diagnosis not present

## 2020-02-23 DIAGNOSIS — E039 Hypothyroidism, unspecified: Secondary | ICD-10-CM | POA: Diagnosis not present

## 2020-02-23 DIAGNOSIS — F0390 Unspecified dementia without behavioral disturbance: Secondary | ICD-10-CM | POA: Diagnosis not present

## 2020-02-23 DIAGNOSIS — D649 Anemia, unspecified: Secondary | ICD-10-CM | POA: Diagnosis not present

## 2020-02-23 DIAGNOSIS — S72001A Fracture of unspecified part of neck of right femur, initial encounter for closed fracture: Secondary | ICD-10-CM | POA: Diagnosis not present

## 2020-02-23 DIAGNOSIS — A0472 Enterocolitis due to Clostridium difficile, not specified as recurrent: Secondary | ICD-10-CM | POA: Diagnosis not present

## 2020-02-23 DIAGNOSIS — E876 Hypokalemia: Secondary | ICD-10-CM | POA: Diagnosis not present

## 2020-02-23 DIAGNOSIS — E785 Hyperlipidemia, unspecified: Secondary | ICD-10-CM | POA: Diagnosis not present

## 2020-02-24 NOTE — Telephone Encounter (Signed)
I think rehab would be a good idea.   Hospital physician attending to him will help make that call.

## 2020-02-26 NOTE — Telephone Encounter (Signed)
Spoke with the pts wife and informed her of the message below. 

## 2020-02-28 DIAGNOSIS — G319 Degenerative disease of nervous system, unspecified: Secondary | ICD-10-CM | POA: Diagnosis not present

## 2020-02-28 DIAGNOSIS — M4319 Spondylolisthesis, multiple sites in spine: Secondary | ICD-10-CM | POA: Diagnosis not present

## 2020-02-28 DIAGNOSIS — S0990XA Unspecified injury of head, initial encounter: Secondary | ICD-10-CM | POA: Diagnosis not present

## 2020-02-28 DIAGNOSIS — S065X9A Traumatic subdural hemorrhage with loss of consciousness of unspecified duration, initial encounter: Secondary | ICD-10-CM | POA: Diagnosis not present

## 2020-02-28 DIAGNOSIS — Z9181 History of falling: Secondary | ICD-10-CM | POA: Diagnosis not present

## 2020-02-28 DIAGNOSIS — M4314 Spondylolisthesis, thoracic region: Secondary | ICD-10-CM | POA: Diagnosis not present

## 2020-02-28 DIAGNOSIS — I959 Hypotension, unspecified: Secondary | ICD-10-CM | POA: Diagnosis not present

## 2020-02-28 DIAGNOSIS — S72001A Fracture of unspecified part of neck of right femur, initial encounter for closed fracture: Secondary | ICD-10-CM | POA: Diagnosis not present

## 2020-02-28 DIAGNOSIS — Z452 Encounter for adjustment and management of vascular access device: Secondary | ICD-10-CM | POA: Diagnosis not present

## 2020-02-28 DIAGNOSIS — R4182 Altered mental status, unspecified: Secondary | ICD-10-CM | POA: Diagnosis not present

## 2020-02-28 DIAGNOSIS — M4313 Spondylolisthesis, cervicothoracic region: Secondary | ICD-10-CM | POA: Diagnosis not present

## 2020-02-28 DIAGNOSIS — M79641 Pain in right hand: Secondary | ICD-10-CM | POA: Diagnosis not present

## 2020-02-28 DIAGNOSIS — W1839XA Other fall on same level, initial encounter: Secondary | ICD-10-CM | POA: Diagnosis not present

## 2020-02-28 DIAGNOSIS — M4312 Spondylolisthesis, cervical region: Secondary | ICD-10-CM | POA: Diagnosis not present

## 2020-02-28 DIAGNOSIS — Z79899 Other long term (current) drug therapy: Secondary | ICD-10-CM | POA: Diagnosis not present

## 2020-02-28 DIAGNOSIS — R930 Abnormal findings on diagnostic imaging of skull and head, not elsewhere classified: Secondary | ICD-10-CM | POA: Diagnosis not present

## 2020-02-28 DIAGNOSIS — Z593 Problems related to living in residential institution: Secondary | ICD-10-CM | POA: Diagnosis not present

## 2020-02-28 DIAGNOSIS — N134 Hydroureter: Secondary | ICD-10-CM | POA: Diagnosis not present

## 2020-02-28 DIAGNOSIS — Z981 Arthrodesis status: Secondary | ICD-10-CM | POA: Diagnosis not present

## 2020-02-28 DIAGNOSIS — S0191XA Laceration without foreign body of unspecified part of head, initial encounter: Secondary | ICD-10-CM | POA: Diagnosis not present

## 2020-02-28 DIAGNOSIS — F0390 Unspecified dementia without behavioral disturbance: Secondary | ICD-10-CM | POA: Diagnosis not present

## 2020-02-28 DIAGNOSIS — S06360A Traumatic hemorrhage of cerebrum, unspecified, without loss of consciousness, initial encounter: Secondary | ICD-10-CM | POA: Diagnosis not present

## 2020-02-28 DIAGNOSIS — J3489 Other specified disorders of nose and nasal sinuses: Secondary | ICD-10-CM | POA: Diagnosis not present

## 2020-02-28 DIAGNOSIS — W19XXXA Unspecified fall, initial encounter: Secondary | ICD-10-CM | POA: Diagnosis not present

## 2020-02-28 DIAGNOSIS — F330 Major depressive disorder, recurrent, mild: Secondary | ICD-10-CM | POA: Diagnosis not present

## 2020-02-28 DIAGNOSIS — Z7901 Long term (current) use of anticoagulants: Secondary | ICD-10-CM | POA: Diagnosis not present

## 2020-02-28 DIAGNOSIS — I618 Other nontraumatic intracerebral hemorrhage: Secondary | ICD-10-CM | POA: Diagnosis not present

## 2020-02-28 DIAGNOSIS — Z9889 Other specified postprocedural states: Secondary | ICD-10-CM | POA: Diagnosis not present

## 2020-02-28 DIAGNOSIS — K449 Diaphragmatic hernia without obstruction or gangrene: Secondary | ICD-10-CM | POA: Diagnosis not present

## 2020-02-28 DIAGNOSIS — S199XXA Unspecified injury of neck, initial encounter: Secondary | ICD-10-CM | POA: Diagnosis not present

## 2020-02-28 DIAGNOSIS — Z96641 Presence of right artificial hip joint: Secondary | ICD-10-CM | POA: Diagnosis not present

## 2020-02-28 DIAGNOSIS — S72001D Fracture of unspecified part of neck of right femur, subsequent encounter for closed fracture with routine healing: Secondary | ICD-10-CM | POA: Diagnosis not present

## 2020-02-28 DIAGNOSIS — Z049 Encounter for examination and observation for unspecified reason: Secondary | ICD-10-CM | POA: Diagnosis not present

## 2020-02-28 DIAGNOSIS — E038 Other specified hypothyroidism: Secondary | ICD-10-CM | POA: Diagnosis not present

## 2020-02-28 DIAGNOSIS — Z7409 Other reduced mobility: Secondary | ICD-10-CM | POA: Diagnosis not present

## 2020-02-28 DIAGNOSIS — M47892 Other spondylosis, cervical region: Secondary | ICD-10-CM | POA: Diagnosis not present

## 2020-02-28 DIAGNOSIS — I2699 Other pulmonary embolism without acute cor pulmonale: Secondary | ICD-10-CM | POA: Diagnosis not present

## 2020-02-28 DIAGNOSIS — R102 Pelvic and perineal pain: Secondary | ICD-10-CM | POA: Diagnosis not present

## 2020-02-28 DIAGNOSIS — M6281 Muscle weakness (generalized): Secondary | ICD-10-CM | POA: Diagnosis not present

## 2020-02-28 DIAGNOSIS — E785 Hyperlipidemia, unspecified: Secondary | ICD-10-CM | POA: Diagnosis not present

## 2020-02-28 DIAGNOSIS — Y998 Other external cause status: Secondary | ICD-10-CM | POA: Diagnosis not present

## 2020-02-28 DIAGNOSIS — Z20822 Contact with and (suspected) exposure to covid-19: Secondary | ICD-10-CM | POA: Diagnosis not present

## 2020-02-28 DIAGNOSIS — I10 Essential (primary) hypertension: Secondary | ICD-10-CM | POA: Diagnosis not present

## 2020-02-28 DIAGNOSIS — M25531 Pain in right wrist: Secondary | ICD-10-CM | POA: Diagnosis not present

## 2020-02-28 DIAGNOSIS — M4802 Spinal stenosis, cervical region: Secondary | ICD-10-CM | POA: Diagnosis not present

## 2020-02-28 DIAGNOSIS — M47812 Spondylosis without myelopathy or radiculopathy, cervical region: Secondary | ICD-10-CM | POA: Diagnosis not present

## 2020-02-28 DIAGNOSIS — M25551 Pain in right hip: Secondary | ICD-10-CM | POA: Diagnosis not present

## 2020-02-28 DIAGNOSIS — R29898 Other symptoms and signs involving the musculoskeletal system: Secondary | ICD-10-CM | POA: Diagnosis not present

## 2020-02-29 DIAGNOSIS — I10 Essential (primary) hypertension: Secondary | ICD-10-CM | POA: Diagnosis not present

## 2020-02-29 DIAGNOSIS — F330 Major depressive disorder, recurrent, mild: Secondary | ICD-10-CM | POA: Diagnosis not present

## 2020-02-29 DIAGNOSIS — E038 Other specified hypothyroidism: Secondary | ICD-10-CM | POA: Diagnosis not present

## 2020-02-29 DIAGNOSIS — F0390 Unspecified dementia without behavioral disturbance: Secondary | ICD-10-CM | POA: Diagnosis not present

## 2020-02-29 DIAGNOSIS — Z96641 Presence of right artificial hip joint: Secondary | ICD-10-CM | POA: Diagnosis not present

## 2020-03-04 DIAGNOSIS — M25551 Pain in right hip: Secondary | ICD-10-CM | POA: Diagnosis not present

## 2020-03-06 ENCOUNTER — Other Ambulatory Visit: Payer: Self-pay | Admitting: Urology

## 2020-03-12 DIAGNOSIS — Z452 Encounter for adjustment and management of vascular access device: Secondary | ICD-10-CM | POA: Diagnosis not present

## 2020-03-12 DIAGNOSIS — Z79899 Other long term (current) drug therapy: Secondary | ICD-10-CM | POA: Diagnosis not present

## 2020-03-22 DIAGNOSIS — M4312 Spondylolisthesis, cervical region: Secondary | ICD-10-CM | POA: Diagnosis not present

## 2020-03-22 DIAGNOSIS — G319 Degenerative disease of nervous system, unspecified: Secondary | ICD-10-CM | POA: Diagnosis not present

## 2020-03-22 DIAGNOSIS — M79641 Pain in right hand: Secondary | ICD-10-CM | POA: Diagnosis not present

## 2020-03-22 DIAGNOSIS — M25531 Pain in right wrist: Secondary | ICD-10-CM | POA: Diagnosis not present

## 2020-03-22 DIAGNOSIS — Z7901 Long term (current) use of anticoagulants: Secondary | ICD-10-CM | POA: Diagnosis not present

## 2020-03-22 DIAGNOSIS — M47892 Other spondylosis, cervical region: Secondary | ICD-10-CM | POA: Diagnosis not present

## 2020-03-22 DIAGNOSIS — N134 Hydroureter: Secondary | ICD-10-CM | POA: Diagnosis not present

## 2020-03-22 DIAGNOSIS — E785 Hyperlipidemia, unspecified: Secondary | ICD-10-CM | POA: Diagnosis not present

## 2020-03-22 DIAGNOSIS — S06360A Traumatic hemorrhage of cerebrum, unspecified, without loss of consciousness, initial encounter: Secondary | ICD-10-CM | POA: Diagnosis not present

## 2020-03-22 DIAGNOSIS — M4319 Spondylolisthesis, multiple sites in spine: Secondary | ICD-10-CM | POA: Diagnosis not present

## 2020-03-22 DIAGNOSIS — R930 Abnormal findings on diagnostic imaging of skull and head, not elsewhere classified: Secondary | ICD-10-CM | POA: Diagnosis not present

## 2020-03-22 DIAGNOSIS — M4802 Spinal stenosis, cervical region: Secondary | ICD-10-CM | POA: Diagnosis not present

## 2020-03-22 DIAGNOSIS — W1839XA Other fall on same level, initial encounter: Secondary | ICD-10-CM | POA: Diagnosis not present

## 2020-03-22 DIAGNOSIS — Z9889 Other specified postprocedural states: Secondary | ICD-10-CM | POA: Diagnosis not present

## 2020-03-22 DIAGNOSIS — Y998 Other external cause status: Secondary | ICD-10-CM | POA: Diagnosis not present

## 2020-03-22 DIAGNOSIS — J3489 Other specified disorders of nose and nasal sinuses: Secondary | ICD-10-CM | POA: Diagnosis not present

## 2020-03-22 DIAGNOSIS — S0191XA Laceration without foreign body of unspecified part of head, initial encounter: Secondary | ICD-10-CM | POA: Diagnosis not present

## 2020-03-22 DIAGNOSIS — Z20822 Contact with and (suspected) exposure to covid-19: Secondary | ICD-10-CM | POA: Diagnosis not present

## 2020-03-22 DIAGNOSIS — M4314 Spondylolisthesis, thoracic region: Secondary | ICD-10-CM | POA: Diagnosis not present

## 2020-03-22 DIAGNOSIS — Z7409 Other reduced mobility: Secondary | ICD-10-CM | POA: Diagnosis not present

## 2020-03-22 DIAGNOSIS — K449 Diaphragmatic hernia without obstruction or gangrene: Secondary | ICD-10-CM | POA: Diagnosis not present

## 2020-03-22 DIAGNOSIS — I2699 Other pulmonary embolism without acute cor pulmonale: Secondary | ICD-10-CM | POA: Diagnosis not present

## 2020-03-22 DIAGNOSIS — Z593 Problems related to living in residential institution: Secondary | ICD-10-CM | POA: Diagnosis not present

## 2020-03-22 DIAGNOSIS — R102 Pelvic and perineal pain: Secondary | ICD-10-CM | POA: Diagnosis not present

## 2020-03-22 DIAGNOSIS — S0990XA Unspecified injury of head, initial encounter: Secondary | ICD-10-CM | POA: Diagnosis not present

## 2020-03-22 DIAGNOSIS — S199XXA Unspecified injury of neck, initial encounter: Secondary | ICD-10-CM | POA: Diagnosis not present

## 2020-03-22 DIAGNOSIS — S065X9A Traumatic subdural hemorrhage with loss of consciousness of unspecified duration, initial encounter: Secondary | ICD-10-CM | POA: Diagnosis not present

## 2020-03-22 DIAGNOSIS — M4313 Spondylolisthesis, cervicothoracic region: Secondary | ICD-10-CM | POA: Diagnosis not present

## 2020-03-22 DIAGNOSIS — Z96641 Presence of right artificial hip joint: Secondary | ICD-10-CM | POA: Diagnosis not present

## 2020-03-22 DIAGNOSIS — Z981 Arthrodesis status: Secondary | ICD-10-CM | POA: Diagnosis not present

## 2020-03-22 DIAGNOSIS — M47812 Spondylosis without myelopathy or radiculopathy, cervical region: Secondary | ICD-10-CM | POA: Diagnosis not present

## 2020-03-22 DIAGNOSIS — I618 Other nontraumatic intracerebral hemorrhage: Secondary | ICD-10-CM | POA: Diagnosis not present

## 2020-03-22 DIAGNOSIS — I10 Essential (primary) hypertension: Secondary | ICD-10-CM | POA: Diagnosis not present

## 2020-03-23 DIAGNOSIS — Z515 Encounter for palliative care: Secondary | ICD-10-CM | POA: Diagnosis not present

## 2020-03-23 DIAGNOSIS — N134 Hydroureter: Secondary | ICD-10-CM | POA: Diagnosis not present

## 2020-03-23 DIAGNOSIS — S065X0A Traumatic subdural hemorrhage without loss of consciousness, initial encounter: Secondary | ICD-10-CM | POA: Diagnosis not present

## 2020-03-23 DIAGNOSIS — Z043 Encounter for examination and observation following other accident: Secondary | ICD-10-CM | POA: Diagnosis not present

## 2020-03-23 DIAGNOSIS — I2699 Other pulmonary embolism without acute cor pulmonale: Secondary | ICD-10-CM | POA: Diagnosis not present

## 2020-03-23 DIAGNOSIS — W19XXXA Unspecified fall, initial encounter: Secondary | ICD-10-CM | POA: Diagnosis not present

## 2020-03-23 DIAGNOSIS — K6389 Other specified diseases of intestine: Secondary | ICD-10-CM | POA: Diagnosis not present

## 2020-03-23 DIAGNOSIS — S06360A Traumatic hemorrhage of cerebrum, unspecified, without loss of consciousness, initial encounter: Secondary | ICD-10-CM | POA: Diagnosis not present

## 2020-03-24 DIAGNOSIS — I2699 Other pulmonary embolism without acute cor pulmonale: Secondary | ICD-10-CM | POA: Diagnosis not present

## 2020-03-24 DIAGNOSIS — W19XXXA Unspecified fall, initial encounter: Secondary | ICD-10-CM | POA: Diagnosis not present

## 2020-03-24 DIAGNOSIS — S06360A Traumatic hemorrhage of cerebrum, unspecified, without loss of consciousness, initial encounter: Secondary | ICD-10-CM | POA: Diagnosis not present

## 2020-03-25 DIAGNOSIS — R561 Post traumatic seizures: Secondary | ICD-10-CM | POA: Diagnosis not present

## 2020-03-25 DIAGNOSIS — R52 Pain, unspecified: Secondary | ICD-10-CM | POA: Diagnosis not present

## 2020-03-25 DIAGNOSIS — I10 Essential (primary) hypertension: Secondary | ICD-10-CM | POA: Diagnosis not present

## 2020-03-25 DIAGNOSIS — T8149XD Infection following a procedure, other surgical site, subsequent encounter: Secondary | ICD-10-CM | POA: Diagnosis not present

## 2020-03-25 DIAGNOSIS — R0902 Hypoxemia: Secondary | ICD-10-CM | POA: Diagnosis not present

## 2020-03-25 DIAGNOSIS — G459 Transient cerebral ischemic attack, unspecified: Secondary | ICD-10-CM | POA: Diagnosis not present

## 2020-03-25 DIAGNOSIS — R569 Unspecified convulsions: Secondary | ICD-10-CM | POA: Diagnosis not present

## 2020-03-25 DIAGNOSIS — F0390 Unspecified dementia without behavioral disturbance: Secondary | ICD-10-CM | POA: Diagnosis not present

## 2020-03-25 DIAGNOSIS — I2699 Other pulmonary embolism without acute cor pulmonale: Secondary | ICD-10-CM | POA: Diagnosis not present

## 2020-03-27 DIAGNOSIS — M6281 Muscle weakness (generalized): Secondary | ICD-10-CM | POA: Diagnosis not present

## 2020-03-27 DIAGNOSIS — R2689 Other abnormalities of gait and mobility: Secondary | ICD-10-CM | POA: Diagnosis not present

## 2020-03-28 DIAGNOSIS — M6281 Muscle weakness (generalized): Secondary | ICD-10-CM | POA: Diagnosis not present

## 2020-03-28 DIAGNOSIS — R2689 Other abnormalities of gait and mobility: Secondary | ICD-10-CM | POA: Diagnosis not present

## 2020-04-04 ENCOUNTER — Telehealth: Payer: Self-pay | Admitting: Gastroenterology

## 2020-04-04 NOTE — Telephone Encounter (Signed)
Dr. Tarri Glenn notified.

## 2020-04-04 NOTE — Telephone Encounter (Signed)
Please let me know if I need to do something with this information. I do not have an established relationship with this patient. Thanks.

## 2020-04-04 NOTE — Telephone Encounter (Signed)
Appears pt was down for a colon recall in 09/2019. Will have Yesi notate I the chart that the pt is deceased

## 2020-04-23 DEATH — deceased

## 2020-04-28 IMAGING — MR MR HEAD WO/W CM
12 series · 48 of 48 positions shown · IV contrast (multihance)
Comparison: No pertinent prior studies available for comparison.

CLINICAL DATA: Cerebellar ataxia in diseases classified elsewhere.
Ataxia, stroke suspected. Additional history provided: Balance
issues, confusion and memory loss for 6 months, previous head trauma
in 8725.

EXAM:
MRI HEAD WITHOUT AND WITH CONTRAST
TECHNIQUE: Multiplanar, multiecho pulse sequences of the brain and surrounding
structures were obtained without and with intravenous contrast.
CONTRAST:  15mL MULTIHANCE GADOBENATE DIMEGLUMINE 529 MG/ML IV SOLN

[Series 2: t1_se_sag · sagittal · 5.0mm · 0.45mm/px · 1 of 22 slices shown]
[im 1/22]
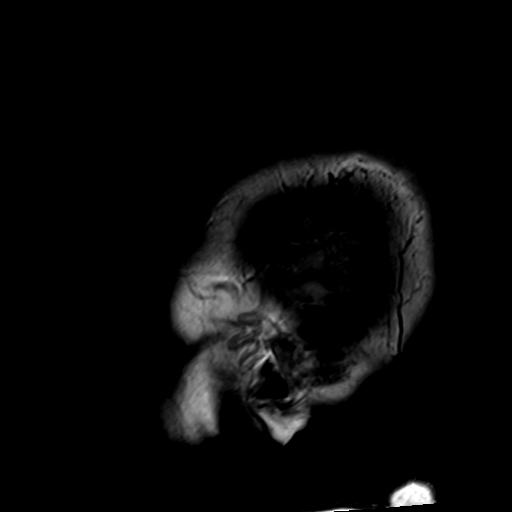

[Series 3: ep2d_diff_3 · axial · 3.0mm · 1.80mm/px · z∈[-18,+134]mm · 7 of 106 slices shown]
[im 1/106]
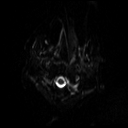
[im 18/106]
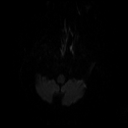
[im 36/106]
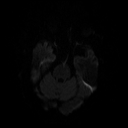
[im 53/106]
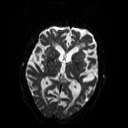
[im 71/106]
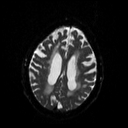
[im 88/106]
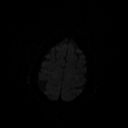
[im 106/106]
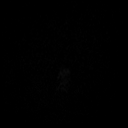

[Series 4: ep2d_diff_3_adc · axial · 3.0mm · 1.80mm/px · z∈[-18,+134]mm · 3 of 54 slices shown]
[im 1/54]
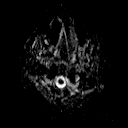
[im 27/54]
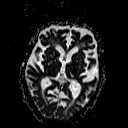
[im 54/54]
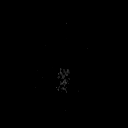

[Series 5: ep2d_diff_cor · coronal · 5.0mm · 1.77mm/px · 4 of 60 slices shown]
[im 1/60]
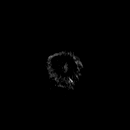
[im 20/60]
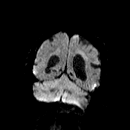
[im 40/60]
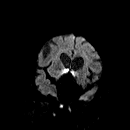
[im 60/60]
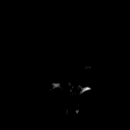

[Series 6: ep2d_diff_cor_adc · coronal · 5.0mm · 1.77mm/px · 2 of 30 slices shown]
[im 1/30]
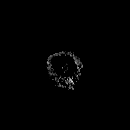
[im 30/30]
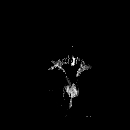

[Series 7: FLAIR · axial · 3.0mm · 0.45mm/px · z∈[-19,+135]mm · 2 of 28 slices shown]
[im 1/28]
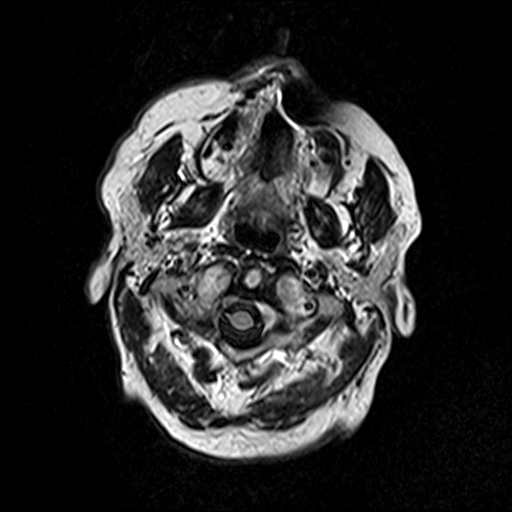
[im 28/28]
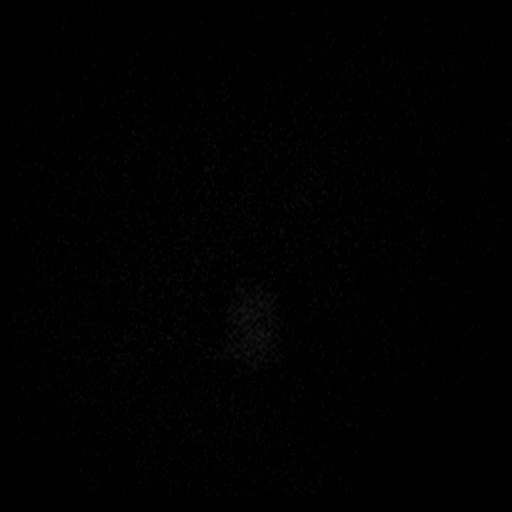

[Series 8: t2_tse_tra · axial · 5.0mm · 0.60mm/px · z∈[-17,+127]mm · 2 of 26 slices shown]
[im 1/26]
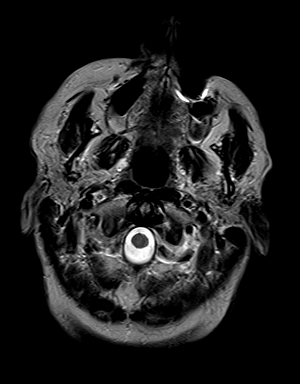
[im 26/26]
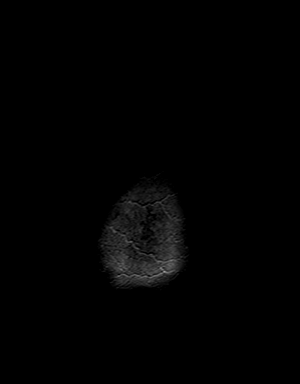

[Series 10: swi_images · axial · 2.0mm · 0.90mm/px · z∈[-20,+130]mm · 5 of 80 slices shown]
[im 1/80]
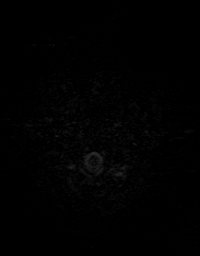
[im 20/80]
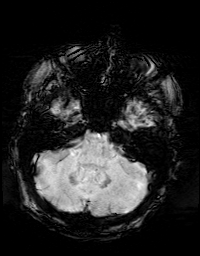
[im 40/80]
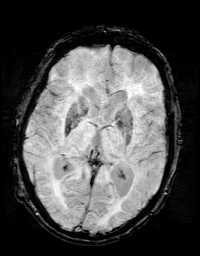
[im 60/80]
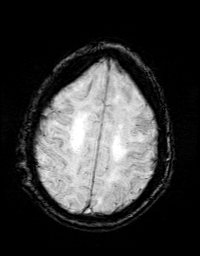
[im 80/80]
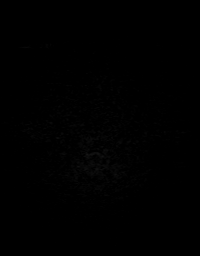

[Series 11: t1_mpr_tra · axial · 1.0mm · 0.72mm/px · z∈[-11,+126]mm · 9 of 144 slices shown]
[im 1/144]
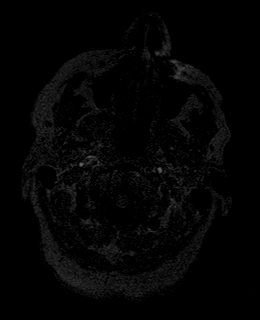
[im 18/144]
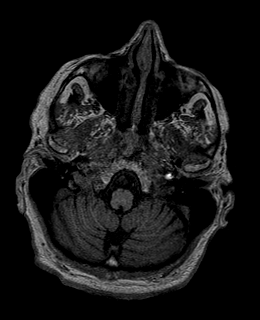
[im 36/144]
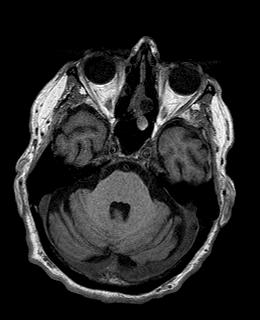
[im 54/144]
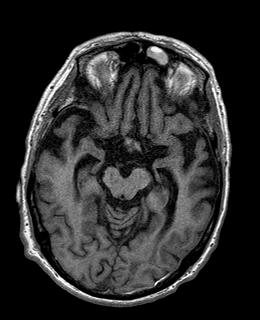
[im 72/144]
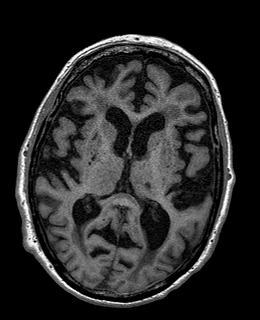
[im 90/144]
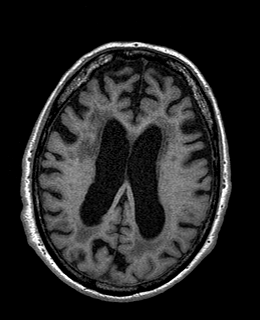
[im 108/144]
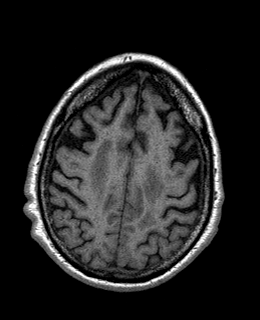
[im 126/144]
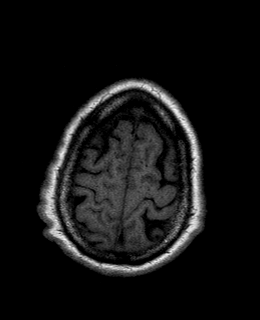
[im 144/144]
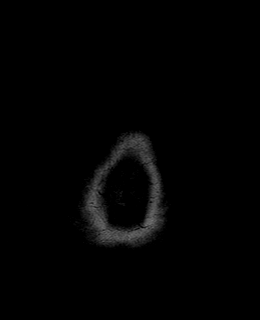

[Series 12: T2 post-contrast · coronal · 5.0mm · 0.45mm/px · 2 of 30 slices shown]
[im 1/30]
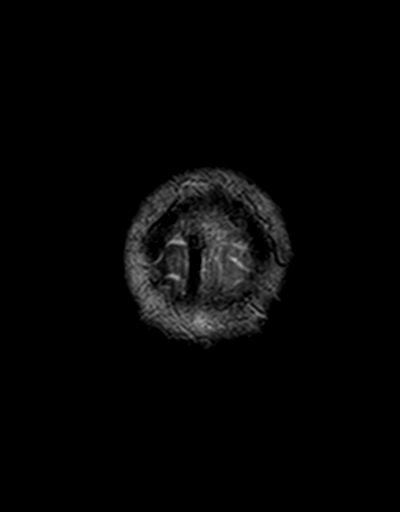
[im 30/30]
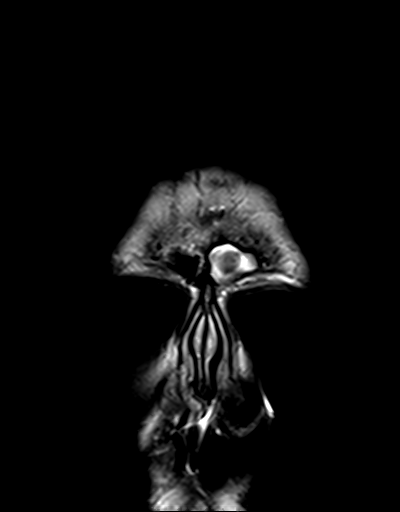

[Series 13: post t1_mpr_tra · axial · 1.0mm · 0.72mm/px · z∈[-11,+126]mm · 9 of 144 slices shown]
[im 1/144]
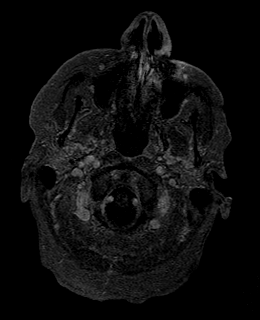
[im 18/144]
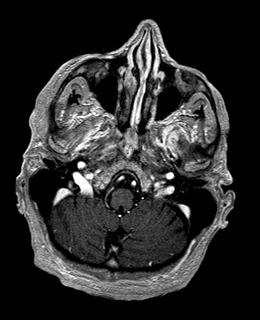
[im 36/144]
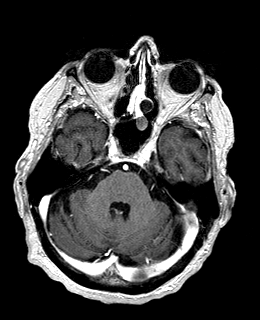
[im 54/144]
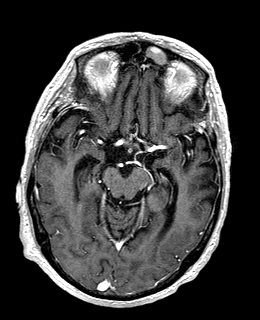
[im 72/144]
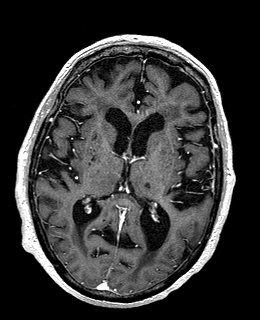
[im 90/144]
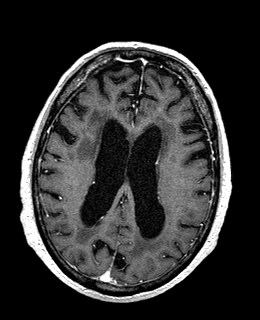
[im 108/144]
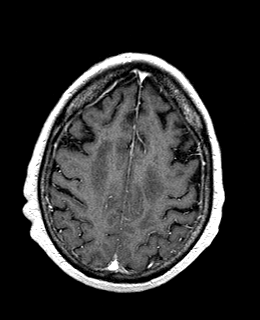
[im 126/144]
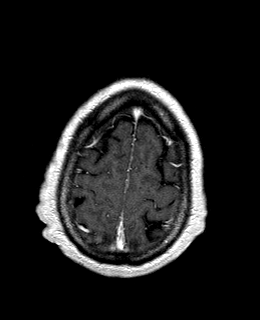
[im 144/144]
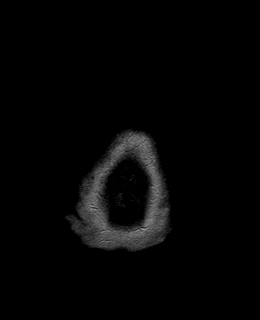

[Series 14: T1 post-contrast · coronal · 5.0mm · 0.72mm/px · 2 of 30 slices shown]
[im 1/30]
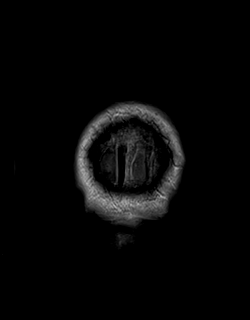
[im 30/30]
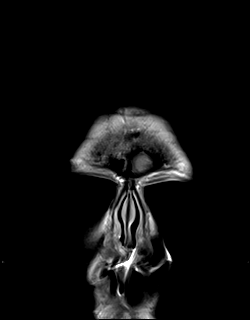

[48 of 48 positions shown; findings below may reference images not displayed]

FINDINGS: Brain:

There is no evidence of acute infarct.

No evidence of intracranial mass.

No midline shift or extra-axial fluid collection.

No chronic intracranial blood products.

Advanced T2/FLAIR hyperintensity within the cerebral white matter
consistent with chronic small vessel ischemic disease. Chronic
hemorrhagic lacunar infarct involving the right caudate body and
extending inferiorly within the right basal ganglia/internal
capsule. Additional chronic lacunar infarcts within the left
thalamus.

Moderate generalized parenchymal atrophy.

No abnormal intracranial enhancement is identified.

Vascular: Flow voids maintained within the proximal large arterial
vessels.

Skull and upper cervical spine: No focal marrow lesion

Sinuses/Orbits: Visualized orbits demonstrate no acute abnormality.
Complete T1/T2 hyperintense opacification of the left frontal sinus.
Left ethmoid sinus mucous retention cyst. No significant mastoid
effusion.
IMPRESSION: 1. No evidence of acute intracranial abnormality.
2. Advanced chronic small vessel ischemic disease with chronic
lacunar infarcts as detailed.
3. Moderate generalized parenchymal atrophy.
4. Left frontal sinusitis and left ethmoid sinus mucous retention
cyst.

## 2020-05-13 IMAGING — DX DG THORACIC SPINE 2V
3 series · 3 of 3 positions shown · non-contrast
Comparison: CT chest 08/02/2017.  Chest x-ray 07/14/2017.

CLINICAL DATA: Back pain after fall.

EXAM:
THORACIC SPINE 2 VIEWS

[thoracic spine ap (1 of 2)]
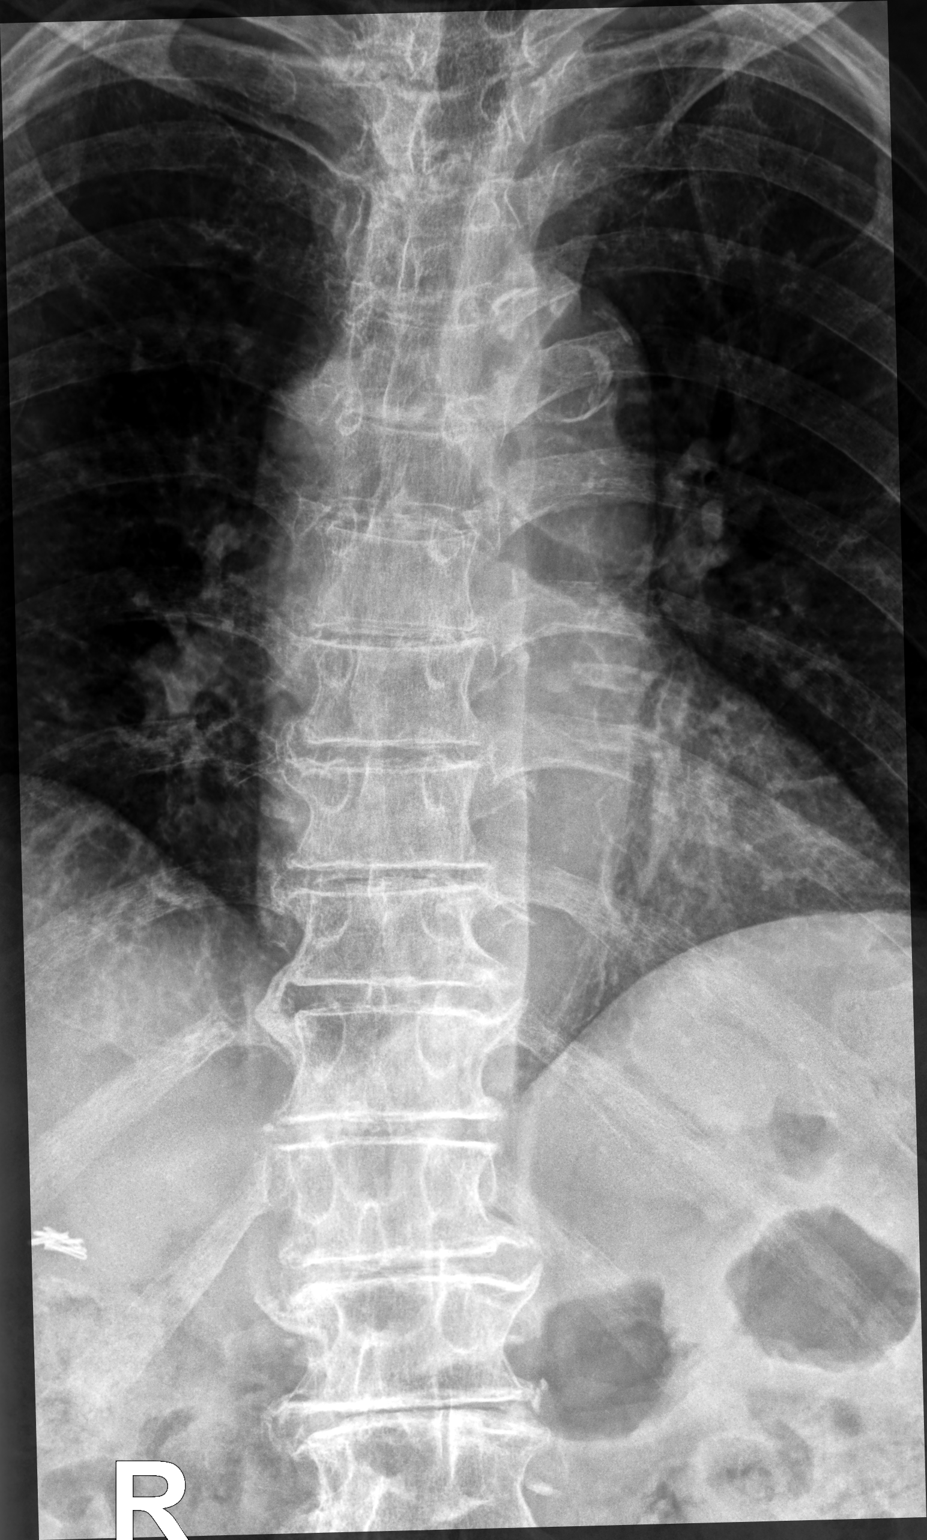

[thoracic spine lat]
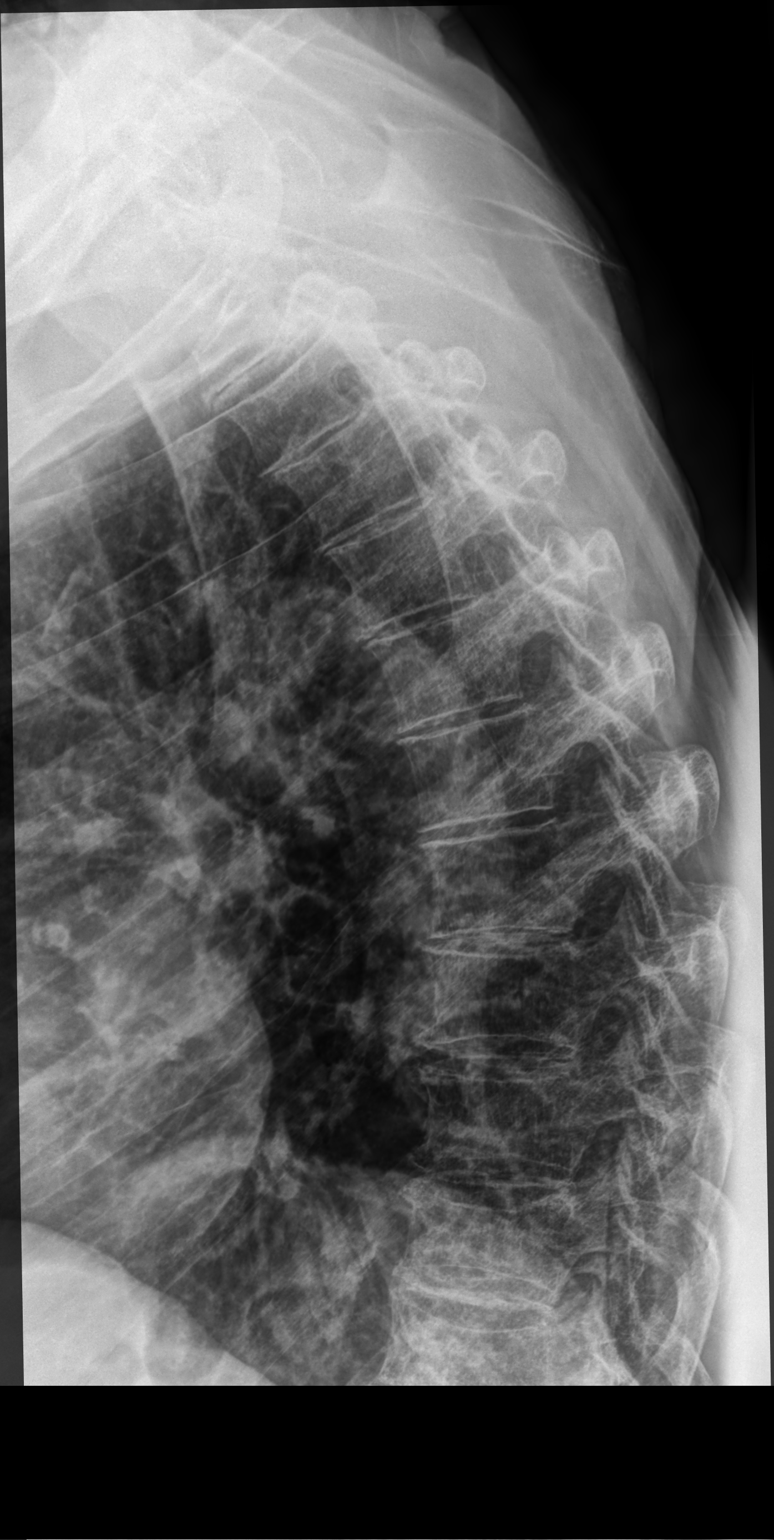

[thoracic spine ap (2 of 2)]
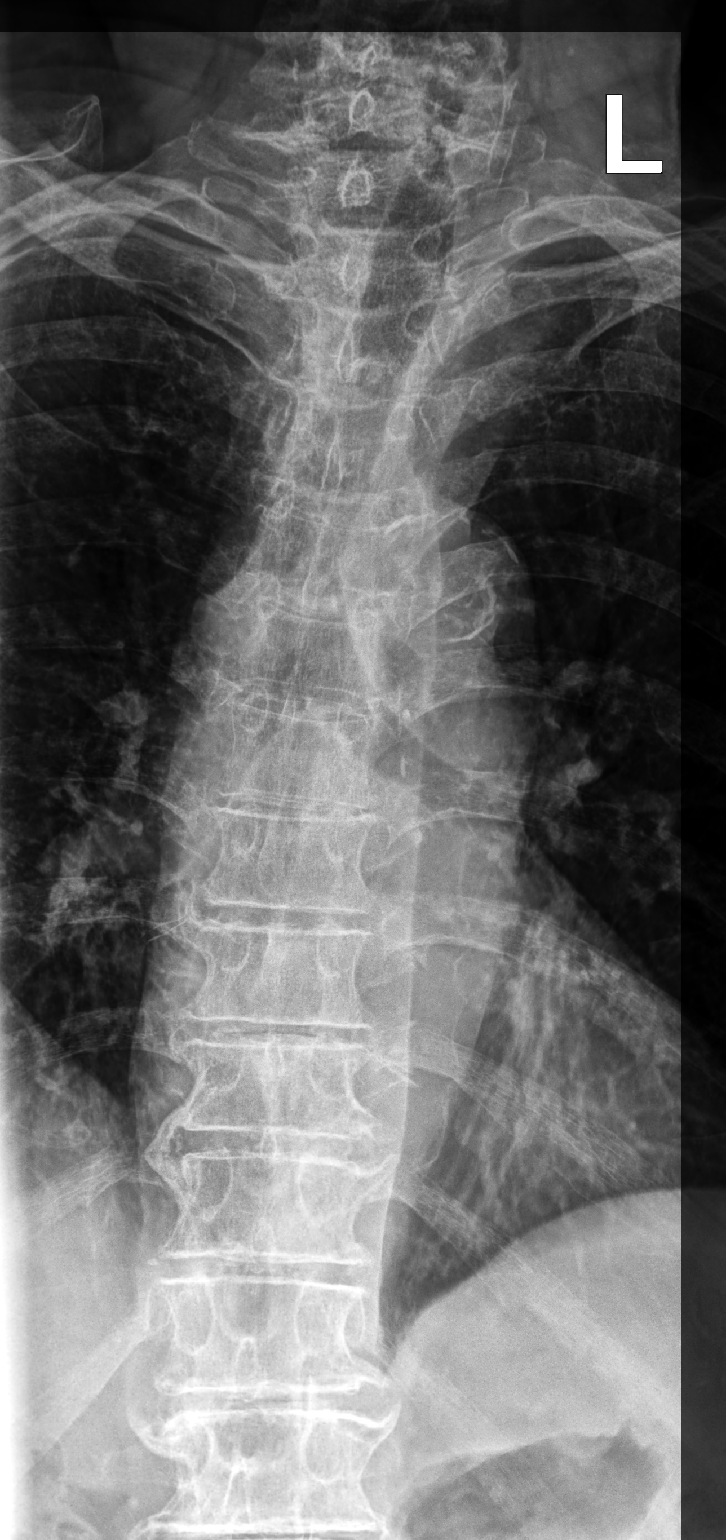

[3 of 3 positions shown; findings below may reference images not displayed]

FINDINGS: Thoracic spine scoliosis concave left. Diffuse osteopenia. Diffuse
multilevel degenerative change. Perceived compressions of lower
thoracic vertebral bodies on lateral view most likely related to
scoliosis. No definite compression fractures identified on AP view.
If symptoms persist MRI can be obtained.
IMPRESSION: Thoracic spine scoliosis concave left. Diffuse osteopenia. Diffuse
multilevel degenerative change. No acute abnormality identified.

## 2020-06-27 ENCOUNTER — Encounter: Payer: Self-pay | Admitting: Family Medicine

## 2020-07-02 ENCOUNTER — Ambulatory Visit: Payer: Medicare HMO | Admitting: Family Medicine

## 2020-12-27 ENCOUNTER — Ambulatory Visit: Payer: Medicare HMO
